# Patient Record
Sex: Female | Born: 1985 | Race: Black or African American | Hispanic: No | Marital: Single | Smoking: Never smoker
Health system: Southern US, Community
[De-identification: ages and names within clinical notes are randomized; demographics above are authoritative.]

## PROBLEM LIST (undated history)

## (undated) ENCOUNTER — Inpatient Hospital Stay (HOSPITAL_COMMUNITY): Payer: Self-pay

## (undated) DIAGNOSIS — D649 Anemia, unspecified: Secondary | ICD-10-CM

## (undated) DIAGNOSIS — B009 Herpesviral infection, unspecified: Secondary | ICD-10-CM

## (undated) DIAGNOSIS — F909 Attention-deficit hyperactivity disorder, unspecified type: Secondary | ICD-10-CM

## (undated) HISTORY — DX: Attention-deficit hyperactivity disorder, unspecified type: F90.9

---

## 2009-06-15 ENCOUNTER — Emergency Department (HOSPITAL_COMMUNITY): Admission: EM | Admit: 2009-06-15 | Discharge: 2009-06-15 | Payer: Self-pay | Admitting: Emergency Medicine

## 2009-06-16 ENCOUNTER — Emergency Department (HOSPITAL_COMMUNITY): Admission: EM | Admit: 2009-06-16 | Discharge: 2009-06-17 | Payer: Self-pay | Admitting: Emergency Medicine

## 2009-10-20 ENCOUNTER — Emergency Department (HOSPITAL_COMMUNITY): Admission: EM | Admit: 2009-10-20 | Discharge: 2009-10-20 | Payer: Self-pay | Admitting: Emergency Medicine

## 2009-11-04 ENCOUNTER — Emergency Department (HOSPITAL_COMMUNITY): Admission: EM | Admit: 2009-11-04 | Discharge: 2009-11-04 | Payer: Self-pay | Admitting: Emergency Medicine

## 2010-07-18 LAB — POCT I-STAT, CHEM 8
BUN: 4 mg/dL — ABNORMAL LOW (ref 6–23)
Creatinine, Ser: 0.7 mg/dL (ref 0.4–1.2)
Hemoglobin: 13.3 g/dL (ref 12.0–15.0)
Potassium: 4 mEq/L (ref 3.5–5.1)
Sodium: 141 mEq/L (ref 135–145)
TCO2: 25 mmol/L (ref 0–100)

## 2010-07-18 LAB — D-DIMER, QUANTITATIVE: D-Dimer, Quant: 0.93 ug/mL-FEU — ABNORMAL HIGH (ref 0.00–0.48)

## 2010-07-22 LAB — URINALYSIS, ROUTINE W REFLEX MICROSCOPIC
Bilirubin Urine: NEGATIVE
Protein, ur: NEGATIVE mg/dL
Specific Gravity, Urine: 1.02 (ref 1.005–1.030)
Urobilinogen, UA: 0.2 mg/dL (ref 0.0–1.0)
pH: 5.5 (ref 5.0–8.0)

## 2010-07-22 LAB — URINE MICROSCOPIC-ADD ON

## 2011-08-15 ENCOUNTER — Ambulatory Visit (INDEPENDENT_AMBULATORY_CARE_PROVIDER_SITE_OTHER): Payer: BC Managed Care – PPO | Admitting: Family Medicine

## 2011-08-15 VITALS — BP 126/80 | HR 68 | Temp 98.6°F | Resp 18 | Ht 63.5 in | Wt 124.0 lb

## 2011-08-15 DIAGNOSIS — Z3002 Counseling and instruction in natural family planning to avoid pregnancy: Secondary | ICD-10-CM

## 2011-08-15 DIAGNOSIS — N920 Excessive and frequent menstruation with regular cycle: Secondary | ICD-10-CM

## 2011-08-15 LAB — POCT CBC
Lymph, poc: 2.4 (ref 0.6–3.4)
MCH, POC: 28.9 pg (ref 27–31.2)
MCHC: 32.7 g/dL (ref 31.8–35.4)
MID (cbc): 0.5 (ref 0–0.9)
MPV: 8.2 fL (ref 0–99.8)
POC LYMPH PERCENT: 47.8 %L (ref 10–50)
POC MID %: 10.3 %M (ref 0–12)
Platelet Count, POC: 214 10*3/uL (ref 142–424)
RDW, POC: 12.6 %

## 2011-08-15 LAB — POCT WET PREP WITH KOH

## 2011-08-15 LAB — POCT URINE PREGNANCY: Preg Test, Ur: NEGATIVE

## 2011-08-15 MED ORDER — MEDROXYPROGESTERONE ACETATE 150 MG/ML IM SUSP
150.0000 mg | Freq: Once | INTRAMUSCULAR | Status: AC
  Administered 2011-08-15: 150 mg via INTRAMUSCULAR

## 2011-08-15 NOTE — Progress Notes (Signed)
Patient Name: Kelly Rush Date of Birth: [birth year] Medical Record Number: [id number] Gender: female Date of Encounter: 08/15/2011  History of Present Illness:  Kelly Rush is a 26 y.o. very pleasant female patient who presents with the following:  Here today with prolonged menstrual cycle- has been bleeding for 3.5 weeks.  Started Depo- provera in January- it was her first shot and she got it in her hometown- not in [place].      She had her shot 05/22/11.  She did not have any bleeding until her current bleeding episode started about 3 weeks ago.  Has never used Depo before.    She has a history of hypercoagulability and is not to use estrogen.  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  She does not smoke  She also has complaint of pelvic pains/ cramps off and on for the last 3 weeks.  She is currently not sexually active- she has been abstinent since about November of last year.   She sometimes notes some "mucus" on her panty liner but no other discharge.   No dysuria.  No nausea, vomiting or fever.   Negative pap/ genprobe 01/2011  Has one son, 55 years old.    There is no problem list on file for this patient.  No past medical history on file. No past surgical history on file. History  Substance Use Topics  . Smoking status: Never Smoker   . Smokeless tobacco: Not on file  . Alcohol Use: Not on file   No family history on file. No Known Allergies  Medication list has been reviewed and updated.  Review of Systems: As per HPI- otherwise negative. Feels well overall  Physical Examination: Filed Vitals:   08/15/11 1402  BP: 126/80  Pulse: 68  Temp: 98.6 F (37 C)  TempSrc: Oral  Resp: 18  Height: 5' 3.5" (1.613 m)  Weight: 124 lb (56.246 kg)    Body mass index is 21.62 kg/(m^2).  GEN: WDWN, NAD, Non-toxic, A & O x 3 HEENT: Atraumatic, Normocephalic. Neck supple. No masses,  No LAD.  TM, oropharynx wnl Ears and Nose: No external deformity. CV: RRR, No M/G/R. No JVD. No thrill. No extra heart sounds. PULM: CTA B, no wheezes, crackles, rhonchi. No retractions. No resp. distress. No accessory muscle use. ABD: S, NT, ND, +BS. No rebound. No HSM. GU: normal externally. Blood and some clots in vaginal vault.  Minimal tenderness in uterus.  No adnexal pain or fullness, no CMT.  No cervical discharge EXTR: No c/c/e NEURO Normal gait.  PSYCH: Normally interactive. Conversant. Not depressed or anxious appearing.  Calm demeanor.    Results for orders placed in visit on 08/15/11  POCT URINE PREGNANCY      Component Value Range   Preg Test, Ur Negative    POCT WET PREP WITH KOH      Component Value Range   Trichomonas, UA Negative     Clue Cells Wet Prep HPF POC 0-1     Epithelial Wet Prep HPF POC 0-5     Yeast Wet Prep HPF POC negative     Bacteria Wet Prep HPF POC 3+     RBC Wet Prep HPF POC 9-22     WBC Wet Prep HPF POC 1-3     KOH Prep POC Negative    POCT CBC      Component Value Range   WBC 5.0  4.6 - 10.2 (K/uL)   Lymph, poc 2.4  0.6 - 3.4    POC LYMPH PERCENT 47.8  10 - 50 (%L)   MID (cbc) 0.5  0 - 0.9    POC MID % 10.3  0 - 12 (%M)   POC Granulocyte 2.1  2 - 6.9    Granulocyte percent 41.9  37 - 80 (%G)   RBC 4.36  4.04 - 5.48 (M/uL)   Hemoglobin 12.6  12.2 - 16.2 (g/dL)   HCT, POC 16.1  09.6 - 47.9 (%)   MCV 88.4  80 - 97 (fL)   MCH, POC 28.9  27 - 31.2 (pg)   MCHC 32.7  31.8 - 35.4 (g/dL)   RDW, POC 04.5     Platelet Count, POC 214  142 - 424 (K/uL)   MPV 8.2  0 - 99.8 (fL)    Assessment and Plan: 1. Menorrhagia  POCT urine pregnancy, GC/chlamydia probe amp, genital, POCT Wet Prep with KOH, POCT CBC   Menorrhagia, due for Depo shot.  Giving shot may help to resolve her bleeding so will give shot today.  She brought her own Depo- provera here today so we will give her injection using her supply. If her bleeding is not better in 4-5 days  please call me and I will refer her to OBG- Sooner if worse.

## 2011-08-16 LAB — GC/CHLAMYDIA PROBE AMP, GENITAL
Chlamydia, DNA Probe: NEGATIVE
GC Probe Amp, Genital: NEGATIVE

## 2011-08-31 ENCOUNTER — Ambulatory Visit (INDEPENDENT_AMBULATORY_CARE_PROVIDER_SITE_OTHER): Payer: BC Managed Care – PPO | Admitting: Family Medicine

## 2011-08-31 ENCOUNTER — Telehealth: Payer: Self-pay

## 2011-08-31 VITALS — BP 115/83 | HR 71 | Temp 98.2°F | Resp 16 | Ht 63.5 in | Wt 125.0 lb

## 2011-08-31 DIAGNOSIS — N920 Excessive and frequent menstruation with regular cycle: Secondary | ICD-10-CM

## 2011-08-31 LAB — POCT CBC
Lymph, poc: 2.2 (ref 0.6–3.4)
MCH, POC: 29.6 pg (ref 27–31.2)
MCHC: 33.3 g/dL (ref 31.8–35.4)
MCV: 88.7 fL (ref 80–97)
MID (cbc): 0.4 (ref 0–0.9)
MPV: 8 fL (ref 0–99.8)
POC LYMPH PERCENT: 47.6 %L (ref 10–50)
RDW, POC: 12.2 %
WBC: 4.7 10*3/uL (ref 4.6–10.2)

## 2011-08-31 LAB — POCT URINE PREGNANCY: Preg Test, Ur: NEGATIVE

## 2011-08-31 LAB — POCT WET PREP WITH KOH
Clue Cells Wet Prep HPF POC: NEGATIVE
Trichomonas, UA: NEGATIVE

## 2011-08-31 MED ORDER — MEGESTROL ACETATE 40 MG PO TABS: ORAL_TABLET | ORAL | Status: DC

## 2011-08-31 NOTE — Telephone Encounter (Signed)
Dr. Patsy Lager- patient states that you had discussed a referral and wants to know if she has to come back in to see you for that or if you can just get her that appointment now. Please call her to advise

## 2011-08-31 NOTE — Telephone Encounter (Signed)
Pt came in to be seen after phone msg was taken.

## 2011-08-31 NOTE — Progress Notes (Signed)
Patient Name: Kelly Rush Date of Birth: 1985-07-12 Medical Record Number: [id number] Gender: female Date of Encounter: 08/31/2011  History of Present Illness:  Kelly Rush is a 26 y.o. very pleasant female patient who presents with the following:  Was here last month to discuss menorrhagia that occurred after her first depo- shot.  We gave her a second Depo- provera shot at her visit.  Her bleeding resolved a few days later and she had no bleeding for about 3 days. However, since then she has had bleeding- heavy at times with clots- off and on.   Also, for the last 7 to 10 days she has felt tired, noted headaches and some dizziness.    She got her first Depo shot in January.   Not currently sexually active- none since the end of 2012.   No vaginal discharge. - GC/ chlamydia at last visit  There is no problem list on file for this patient.  No past medical history on file. No past surgical history on file. History  Substance Use Topics  . Smoking status: Never Smoker   . Smokeless tobacco: Not on file  . Alcohol Use: Not on file   No family history on file. No Known Allergies  Medication list has been reviewed and updated.  Review of Systems: As per HPI- otherwise negative. She notes no urinary or vaginal symptoms  Physical Examination: Filed Vitals:   08/31/11 1559  BP: 115/83  Pulse: 71  Temp: 98.2 F (36.8 C)  TempSrc: Oral  Resp: 16  Height: 5' 3.5" (1.613 m)  Weight: 125 lb (56.7 kg)    Body mass index is 21.80 kg/(m^2).  GEN: WDWN, NAD, Non-toxic, A & O x 3- looks well HEENT: Atraumatic, Normocephalic. Neck supple. No masses, No LAD. Ears and Nose: No external deformity. CV: RRR, No M/G/R. No JVD. No thrill. No extra heart sounds. PULM: CTA B, no wheezes, crackles, rhonchi. No retractions. No resp. distress. No accessory muscle use. ABD: S,  ND, +BS. No rebound. No HSM. There is mild suprapubic and left adnexal tenderness EXTR: No c/c/e NEURO  Normal gait.  PSYCH: Normally interactive. Conversant. Not depressed or anxious appearing.  Calm demeanor.  GU: normal external genitalia, normal vaginal canal with bleeding.  No CMT.  A little tender to uterine and left adnexal palpation, but not very much so  Results for orders placed in visit on 08/31/11  POCT CBC      Component Value Range   WBC 4.7  4.6 - 10.2 (K/uL)   Lymph, poc 2.2  0.6 - 3.4    POC LYMPH PERCENT 47.6  10 - 50 (%L)   MID (cbc) 0.4  0 - 0.9    POC MID % 8.6  0 - 12 (%M)   POC Granulocyte 2.1  2 - 6.9    Granulocyte percent 43.8  37 - 80 (%G)   RBC 4.46  4.04 - 5.48 (M/uL)   Hemoglobin 13.2  12.2 - 16.2 (g/dL)   HCT, POC 78.2  95.6 - 47.9 (%)   MCV 88.7  80 - 97 (fL)   MCH, POC 29.6  27 - 31.2 (pg)   MCHC 33.3  31.8 - 35.4 (g/dL)   RDW, POC 21.3     Platelet Count, POC 248  142 - 424 (K/uL)   MPV 8.0  0 - 99.8 (fL)  POCT WET PREP WITH KOH      Component Value Range   Trichomonas, UA Negative  Clue Cells Wet Prep HPF POC neg     Epithelial Wet Prep HPF POC 0-1     Yeast Wet Prep HPF POC neg     Bacteria Wet Prep HPF POC neg     RBC Wet Prep HPF POC tntc     WBC Wet Prep HPF POC 0-1     KOH Prep POC Negative    POCT URINE PREGNANCY      Component Value Range   Preg Test, Ur Negative      Assessment and Plan: 1. Menorrhagia  POCT CBC, POCT Wet Prep with KOH, POCT urine pregnancy, megestrol (MEGACE) 40 MG tablet   Menorrhagia despite depo- provera.  Due to a clotting disorder Kelly Rush is not to have estrogen.  Will start on Megace 40mg - TID for 5 days, then BID for 5 days, then QD to help stabilize her endometrium and stop her bleeding.  Will also refer to see OBG as soon as possible- suspect that her pain is likely due to uterine cramping and perhaps an ovarian cyst.  She will seek care right away if she gets worse.   Scheduled an app[facility] at [facility] am.  Kelly Rush would like to be seen sooner than this if possible, so  I will look around to see if any other offices have anything in the next couple of days.

## 2011-09-01 ENCOUNTER — Other Ambulatory Visit: Payer: Self-pay | Admitting: Family Medicine

## 2011-09-01 DIAGNOSIS — N920 Excessive and frequent menstruation with regular cycle: Secondary | ICD-10-CM

## 2011-09-13 ENCOUNTER — Ambulatory Visit (INDEPENDENT_AMBULATORY_CARE_PROVIDER_SITE_OTHER): Payer: BC Managed Care – PPO | Admitting: Family Medicine

## 2011-09-13 VITALS — BP 121/83 | HR 80 | Temp 99.0°F | Resp 20 | Ht 63.5 in | Wt 129.0 lb

## 2011-09-13 DIAGNOSIS — Z111 Encounter for screening for respiratory tuberculosis: Secondary | ICD-10-CM

## 2011-09-13 NOTE — Progress Notes (Signed)
  Patient Name: Kelly Rush Date of Birth: 09-06-1985 Medical Record Number: [id number] Gender: female Date of Encounter: 09/13/2011  History of Present Illness:  Kelly Rush is a 26 y.o. very pleasant female patient who presents with the following:  She is here today to have work forms filled out- needs a PPD.  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   She is generally healthy- no major health issues except for menorrhagia for which she is seeing OB- GYN- Dr. Billy Coast. He started her on COC at her visit and she is doing well so far.    She has no problems with lifting, no back problems.  She has a 6 year old child so she is familiar with what her duties will be in a childcare setting.   There is no problem list on file for this patient.  No past medical history on file. No past surgical history on file. History  Substance Use Topics  . Smoking status: Never Smoker   . Smokeless tobacco: Not on file  . Alcohol Use: Not on file   No family history on file. No Known Allergies  Medication list has been reviewed and updated.  Review of Systems: As per HPI- otherwise negative.   Physical Examination: Filed Vitals:   09/13/11 1511  BP: 121/83  Pulse: 80  Temp: 99 F (37.2 C)  TempSrc: Oral  Resp: 20  Height: 5' 3.5" (1.613 m)  Weight: 129 lb (58.514 kg)    Body mass index is 22.49 kg/(m^2).  GEN: WDWN, NAD, Non-toxic, A & O x 3 HEENT: Atraumatic, Normocephalic. Neck supple. No masses, No LAD. Ears and Nose: No external deformity. CV: RRR, No M/G/R. No JVD. No thrill. No extra heart sounds. PULM: CTA B, no wheezes, crackles, rhonchi. No retractions. No resp. distress. No accessory muscle use. ABD: S, NT, ND, +BS. No rebound. No HSM. EXTR: No c/c/e NEURO Normal gait.  PSYCH: Normally interactive. Conversant. Not depressed or anxious appearing.  Calm demeanor.    Assessment and Plan: 1. Screening-pulmonary TB  TB Skin Test   Place PPD.  Otherwise cleared for job.

## 2011-09-16 ENCOUNTER — Encounter (INDEPENDENT_AMBULATORY_CARE_PROVIDER_SITE_OTHER): Payer: BC Managed Care – PPO

## 2011-09-16 DIAGNOSIS — Z111 Encounter for screening for respiratory tuberculosis: Secondary | ICD-10-CM

## 2011-09-16 LAB — TB SKIN TEST: TB Skin Test: NEGATIVE mm

## 2011-09-29 ENCOUNTER — Ambulatory Visit (INDEPENDENT_AMBULATORY_CARE_PROVIDER_SITE_OTHER): Payer: BC Managed Care – PPO | Admitting: Family Medicine

## 2011-09-29 VITALS — BP 119/77 | HR 101 | Temp 98.9°F | Resp 16 | Ht 63.5 in | Wt 126.0 lb

## 2011-09-29 DIAGNOSIS — H6690 Otitis media, unspecified, unspecified ear: Secondary | ICD-10-CM

## 2011-09-29 DIAGNOSIS — R05 Cough: Secondary | ICD-10-CM

## 2011-09-29 DIAGNOSIS — R059 Cough, unspecified: Secondary | ICD-10-CM

## 2011-09-29 MED ORDER — HYDROCODONE-HOMATROPINE 5-1.5 MG/5ML PO SYRP: 5.0000 mL | ORAL_SOLUTION | Freq: Three times a day (TID) | ORAL | Status: AC | PRN

## 2011-09-29 MED ORDER — AMOXICILLIN 500 MG PO CAPS: 1000.0000 mg | ORAL_CAPSULE | Freq: Two times a day (BID) | ORAL | Status: AC

## 2011-09-29 NOTE — Progress Notes (Signed)
  Patient Name: Kelly Rush Date of Birth: Jan 15, 1986 Medical Record Number: [id number] Gender: female Date of Encounter: 09/29/2011  History of Present Illness:  Kelly Rush is a 26 y.o. very pleasant female patient who presents with the following:  She has been ill for about 2 weeks- started with allergy type symptoms (sneezing, runny eyes), then over the last 2 days she felt worse and had a cough, CP.  She coughed up some mucus this morning.  She notes a ST, sneezing, runny nose.  No earache.  No nausea, vomiting or diarrhea.  No fever that she has noted.  She has been taking mucinex and some other OTC cold preps.  No medication this morning.  She has had some recent issues with menorrhagia, but is doing better with an OCP.  She also had a shot of depo- provera in April  There is no problem list on file for this patient.  No past medical history on file. No past surgical history on file. History  Substance Use Topics  . Smoking status: Never Smoker   . Smokeless tobacco: Not on file  . Alcohol Use: Not on file   No family history on file. No Known Allergies  Medication list has been reviewed and updated.  Prior to Admission medications   Medication Sig Start Date End Date Taking? Authorizing Provider  medroxyPROGESTERone (DEPO-PROVERA) 150 MG/ML injection Inject 150 mg into the muscle every 3 (three) months.   Yes Historical Provider, MD  aspirin-acetaminophen-caffeine (EXCEDRIN MIGRAINE) [id number] MG per tablet Take 1 tablet by mouth as needed.    Historical Provider, MD  megestrol (MEGACE) 40 MG tablet Take one pill three times daily for 5 days, then one pills twice daily for 5 days, then once daily 08/31/11   Pearline Cables, MD  Norethin-Eth Estradiol-Fe [facility] FE,WYMZYA Cecille Amsterdam) 0.4-35 MG-MCG tablet Chew 1 tablet by mouth daily.    Historical Provider, MD    Review of Systems: As per HPI- otherwise negative.   Physical Examination: Filed Vitals:   09/29/11 1235  BP: 119/77  Pulse: 101  Temp: 98.9 F (37.2 C)  Resp: 16   Filed Vitals:   09/29/11 1235  Height: 5' 3.5" (1.613 m)  Weight: 126 lb (57.153 kg)   Body mass index is 21.97 kg/(m^2).  GEN: WDWN, NAD, Non-toxic, A & O x 3 HEENT: Atraumatic, Normocephalic. Neck supple. No masses, No LAD.  Right TM is injected and bulging, left WNL, oropharynx wnl Ears and Nose: No external deformity. CV: RRR, No M/G/R. No JVD. No thrill. No extra heart sounds. PULM: CTA B, no wheezes, crackles, rhonchi. No retractions. No resp. distress. No accessory muscle use. ABD: S, NT, ND, +BS. No rebound. No HSM. EXTR: No c/c/e NEURO Normal gait.  PSYCH: Normally interactive. Conversant. Not depressed or anxious appearing.  Calm demeanor.    Assessment and Plan: 1. Otitis media  amoxicillin (AMOXIL) 500 MG capsule  2. Cough  HYDROcodone-homatropine (HYCODAN) 5-1.5 MG/5ML syrup   Treat as above.  Rest, fluids, otc medications for aches as needed. Patient (or parent if minor) instructed to return to clinic or call if not better in 2-3 day(s).   Abbe Amsterdam, MD 09/29/2011 12:53 PM

## 2011-11-14 ENCOUNTER — Encounter (HOSPITAL_COMMUNITY): Payer: Self-pay | Admitting: *Deleted

## 2011-11-14 ENCOUNTER — Emergency Department (HOSPITAL_COMMUNITY)
Admission: EM | Admit: 2011-11-14 | Discharge: 2011-11-15 | Disposition: A | Discharge status: Home / Self Care | Payer: BC Managed Care – PPO | Attending: Emergency Medicine | Admitting: Emergency Medicine

## 2011-11-14 ENCOUNTER — Ambulatory Visit: Payer: BC Managed Care – PPO

## 2011-11-14 DIAGNOSIS — R102 Pelvic and perineal pain: Secondary | ICD-10-CM

## 2011-11-14 DIAGNOSIS — R599 Enlarged lymph nodes, unspecified: Secondary | ICD-10-CM | POA: Insufficient documentation

## 2011-11-14 DIAGNOSIS — R110 Nausea: Secondary | ICD-10-CM | POA: Insufficient documentation

## 2011-11-14 DIAGNOSIS — R10814 Left lower quadrant abdominal tenderness: Secondary | ICD-10-CM | POA: Insufficient documentation

## 2011-11-14 DIAGNOSIS — N898 Other specified noninflammatory disorders of vagina: Secondary | ICD-10-CM | POA: Insufficient documentation

## 2011-11-14 DIAGNOSIS — N949 Unspecified condition associated with female genital organs and menstrual cycle: Secondary | ICD-10-CM | POA: Insufficient documentation

## 2011-11-14 LAB — WET PREP, GENITAL
Trich, Wet Prep: NONE SEEN
Yeast Wet Prep HPF POC: NONE SEEN

## 2011-11-14 LAB — URINALYSIS, ROUTINE W REFLEX MICROSCOPIC
Glucose, UA: NEGATIVE mg/dL
Ketones, ur: NEGATIVE mg/dL
Nitrite: NEGATIVE
Specific Gravity, Urine: 1.03 (ref 1.005–1.030)

## 2011-11-14 LAB — URINE MICROSCOPIC-ADD ON

## 2011-11-14 LAB — POCT PREGNANCY, URINE: Preg Test, Ur: NEGATIVE

## 2011-11-14 MED ORDER — CEFTRIAXONE SODIUM 250 MG IJ SOLR
250.0000 mg | Freq: Once | INTRAMUSCULAR | Status: AC
  Administered 2011-11-15: 250 mg via INTRAMUSCULAR
  Filled 2011-11-14: qty 250

## 2011-11-14 MED ORDER — DOXYCYCLINE HYCLATE 100 MG PO CAPS: 100.0000 mg | ORAL_CAPSULE | Freq: Two times a day (BID) | ORAL | Status: AC

## 2011-11-14 MED ORDER — HYDROCODONE-ACETAMINOPHEN 5-325 MG PO TABS: 1.0000 | ORAL_TABLET | ORAL | Status: AC | PRN

## 2011-11-14 MED ORDER — OXYCODONE-ACETAMINOPHEN 5-325 MG PO TABS
1.0000 | ORAL_TABLET | Freq: Once | ORAL | Status: AC
  Administered 2011-11-14: 1 via ORAL
  Filled 2011-11-14: qty 1

## 2011-11-14 NOTE — ED Provider Notes (Signed)
History     CSN: [id number]  Arrival date & time 11/14/11  Barry Brunner   First MD Initiated Contact with Patient 11/14/11 2200      Chief Complaint  Patient presents with  . Pelvic Pain    (Consider location/radiation/quality/duration/timing/severity/associated sxs/prior treatment) Patient is a 26 y.o. female presenting with pelvic pain. The history is provided by the patient and medical records.  Pelvic Pain This is a recurrent problem. The current episode started today. The problem occurs 2 to 4 times per day. The problem has been unchanged. Associated symptoms include nausea. Pertinent negatives include no abdominal pain, change in bowel habit, chest pain, chills, coughing, fever, headaches, rash, vomiting or weakness. Nothing aggravates the symptoms. Treatments tried: birth control. The treatment provided mild relief.    History reviewed. No pertinent past medical history.  History reviewed. No pertinent past surgical history.  No family history on file.  History  Substance Use Topics  . Smoking status: Never Smoker   . Smokeless tobacco: Not on file  . Alcohol Use: No    OB History    Grav Para Term Preterm Abortions TAB SAB Ect Mult Living                  Review of Systems  Constitutional: Negative for fever and chills.  Respiratory: Negative for cough and shortness of breath.   Cardiovascular: Negative for chest pain and palpitations.  Gastrointestinal: Positive for nausea. Negative for vomiting, abdominal pain, diarrhea, constipation, abdominal distention and change in bowel habit.  Genitourinary: Positive for urgency and pelvic pain. Negative for dysuria, frequency, hematuria, flank pain, decreased urine volume, vaginal bleeding, vaginal discharge and genital sores.  Musculoskeletal: Negative for back pain.  Skin: Negative for color change and rash.  Neurological: Negative for weakness, light-headedness and headaches.  Hematological: Positive for adenopathy (L  groin).  All other systems reviewed and are negative.    Allergies  Review of patient's allergies indicates no known allergies.  Home Medications   Current Outpatient Rx  Name Route Sig Dispense Refill  . ASPIRIN-ACETAMINOPHEN-CAFFEINE 250-250-65 MG PO TABS Oral Take 1 tablet by mouth as needed. For migraine    . NORETHIN-ETH ESTRADIOL-FE 0.4-35 MG-MCG PO CHEW Oral Chew 1 tablet by mouth daily.      BP 126/77  Pulse 89  Temp 98.6 F (37 C) (Oral)  Resp 16  SpO2 100%  LMP 10/07/2011  Physical Exam  Nursing note and vitals reviewed. Constitutional: She is oriented to person, place, and time. She appears well-developed and well-nourished.  HENT:  Head: Normocephalic and atraumatic.  Eyes: Pupils are equal, round, and reactive to light.  Cardiovascular: Normal rate, regular rhythm, normal heart sounds and intact distal pulses.   Pulmonary/Chest: Effort normal and breath sounds normal. No respiratory distress.  Abdominal: Soft. Bowel sounds are normal. She exhibits no distension. There is tenderness in the left lower quadrant.    Genitourinary: Uterus is not tender. Cervix exhibits no motion tenderness and no friability. Right adnexum displays no mass, no tenderness and no fullness. Left adnexum displays no mass, no tenderness and no fullness. No bleeding around the vagina. Vaginal discharge (thick, clear, mucousy) found.       Discomfort with pelvic exam  Lymphadenopathy:       Left: Inguinal (1 cm, mobile) adenopathy present.  Neurological: She is alert and oriented to person, place, and time.  Skin: Skin is warm and dry.  Psychiatric: She has a normal mood and affect.    ED  Course  Procedures (including critical care time)  Labs Reviewed  URINALYSIS, ROUTINE W REFLEX MICROSCOPIC - Abnormal; Notable for the following:    Bilirubin Urine SMALL (*)     Leukocytes, UA SMALL (*)     All other components within normal limits  WET PREP, GENITAL - Abnormal; Notable for the  following:    Clue Cells Wet Prep HPF POC FEW (*)     WBC, Wet Prep HPF POC MODERATE (*)     All other components within normal limits  POCT PREGNANCY, URINE  URINE MICROSCOPIC-ADD ON  GC/CHLAMYDIA PROBE AMP, GENITAL   No results found.   1. Pelvic pain in female       MDM  This is a 27 year old female who presents with left-sided pelvic pain since this morning. She states that she has had similar pain multiple times over the past several months, and has been seen by her regular doctor as well as an OB for this, and they have not found why she has the pain. She states she was started on birth control do to abnormal uterine bleeding, which initially provided some mild relief of her symptoms, but she has continued to have pain.  She says she has a constant ache in that area with intermittent sharp pains radiating to her back that last for several seconds to several minutes before lessening. This morning she noticed a lymph node in her left inguinal region. She has some mild nausea, but no vomiting. She denies any current vaginal discharge or bleeding, does have some urinary frequency, but denies any frequency, dysuria or hematuria. She has not previously had any STDs, and has not had any sexual partners since last December. She denies any other complaints. On exam she does have some tenderness during pelvic exam, but no cervical motion tenderness no adnexal or uterine tenderness. She had a small amount of thick clear mucusy discharge.   Also remarkable for moderate amount of white blood cells, there are small amount of leukocytes on UA without white blood cells. Due to the white cells, will treat the patient with Rocephin and two-week course of doxycycline for unknown pelvic infection. Discussed with the patient treatment at home, followup with her regular doctor and her OD, and indications for return. The patient expresses understanding of this plan.    Theotis Burrow, MD 11/15/11  [phone]

## 2011-11-14 NOTE — ED Notes (Signed)
Patient with sharp pelvic pain that started this am (intermittently).  Denies any burning or pain when she urinates or vaginal discharge.  Denies any intercourse recently.

## 2011-11-14 NOTE — ED Notes (Signed)
No life-threatening labs noted.

## 2011-11-15 NOTE — ED Notes (Signed)
Pt discharged home in good condition with friend.

## 2011-11-16 LAB — GC/CHLAMYDIA PROBE AMP, GENITAL
Chlamydia, DNA Probe: NEGATIVE
GC Probe Amp, Genital: NEGATIVE

## 2011-11-16 NOTE — ED Provider Notes (Signed)
I saw and evaluated the patient, reviewed the resident's note and I agree with the findings and plan. Patient with worsening pelvic pain. She has diffuse abdominal tenderness in the suprapubic region and tenderness with bimanual exam. Patient does have numerous white blood cells on wet prep and GC and Chlamydia were done. Because patient has not been on any antibiotics we'll treat with Doxy to cover for PID.  Gwyneth Sprout, MD 11/16/11 2224

## 2012-06-10 ENCOUNTER — Encounter (HOSPITAL_COMMUNITY): Payer: Self-pay | Admitting: *Deleted

## 2012-06-10 ENCOUNTER — Emergency Department (HOSPITAL_COMMUNITY)
Admission: EM | Admit: 2012-06-10 | Discharge: 2012-06-10 | Disposition: A | Discharge status: Home / Self Care | Payer: BC Managed Care – PPO | Attending: Emergency Medicine | Admitting: Emergency Medicine

## 2012-06-10 DIAGNOSIS — Z3202 Encounter for pregnancy test, result negative: Secondary | ICD-10-CM | POA: Insufficient documentation

## 2012-06-10 DIAGNOSIS — R109 Unspecified abdominal pain: Secondary | ICD-10-CM | POA: Insufficient documentation

## 2012-06-10 DIAGNOSIS — R112 Nausea with vomiting, unspecified: Secondary | ICD-10-CM

## 2012-06-10 DIAGNOSIS — R6883 Chills (without fever): Secondary | ICD-10-CM | POA: Insufficient documentation

## 2012-06-10 DIAGNOSIS — R197 Diarrhea, unspecified: Secondary | ICD-10-CM | POA: Insufficient documentation

## 2012-06-10 DIAGNOSIS — Z7982 Long term (current) use of aspirin: Secondary | ICD-10-CM | POA: Insufficient documentation

## 2012-06-10 LAB — URINALYSIS, ROUTINE W REFLEX MICROSCOPIC
Bilirubin Urine: NEGATIVE
Glucose, UA: NEGATIVE mg/dL
Ketones, ur: >80 mg/dL — AB
Nitrite: NEGATIVE
Protein, ur: NEGATIVE mg/dL
Specific Gravity, Urine: 1.034 — ABNORMAL HIGH (ref 1.005–1.030)
Urobilinogen, UA: 1 mg/dL (ref 0.0–1.0)
pH: 7 (ref 5.0–8.0)

## 2012-06-10 LAB — URINE MICROSCOPIC-ADD ON

## 2012-06-10 MED ORDER — SODIUM CHLORIDE 0.9 % IV BOLUS (SEPSIS)
2000.0000 mL | Freq: Once | INTRAVENOUS | Status: AC
  Administered 2012-06-10: 2000 mL via INTRAVENOUS

## 2012-06-10 MED ORDER — ONDANSETRON 8 MG PO TBDP: 8.0000 mg | ORAL_TABLET | Freq: Three times a day (TID) | ORAL | Status: DC | PRN

## 2012-06-10 MED ORDER — FAMOTIDINE 20 MG PO TABS
40.0000 mg | ORAL_TABLET | Freq: Once | ORAL | Status: AC
  Administered 2012-06-10: 40 mg via ORAL
  Filled 2012-06-10: qty 2

## 2012-06-10 MED ORDER — ONDANSETRON 4 MG PO TBDP
8.0000 mg | ORAL_TABLET | Freq: Once | ORAL | Status: AC
  Administered 2012-06-10: 8 mg via ORAL
  Filled 2012-06-10: qty 2

## 2012-06-10 NOTE — ED Notes (Signed)
Urine sent fluids given

## 2012-06-10 NOTE — ED Provider Notes (Signed)
History     CSN: [id number]  Arrival date & time 06/10/12  0421   First MD Initiated Contact with Patient 06/10/12 [phone]      Chief Complaint  Patient presents with  . Emesis    (Consider location/radiation/quality/duration/timing/severity/associated sxs/prior treatment) HPI Comments: Patient with no significant past medical history, no history of abdominal surgeries presents with onset of upper abdominal pain, chills, vomiting since 1:30 this morning. Patient is vomiting multiple times. Vomiting is nonbloody, nonbilious. Patient has had an episode of soft stool while in emergency department. Stool is nonbloody. No treatments prior to arrival. Pain in the upper abdomen is described as sharp. It does not radiate. Patient denies fevers, recent symptoms of illness. Patient states that her last menstrual period was a week ago and was longer and lighter than normal. Onset acute. Course is constant. Nothing makes symptoms better or worse.  Patient is a 27 y.o. female presenting with vomiting. The history is provided by the patient.  Emesis Associated symptoms: abdominal pain and chills   Associated symptoms: no diarrhea, no headaches, no myalgias and no sore throat     History reviewed. No pertinent past medical history.  History reviewed. No pertinent past surgical history.  No family history on file.  History  Substance Use Topics  . Smoking status: Never Smoker   . Smokeless tobacco: Not on file  . Alcohol Use: No    OB History   Grav Para Term Preterm Abortions TAB SAB Ect Mult Living                  Review of Systems  Constitutional: Positive for chills. Negative for fever.  HENT: Negative for sore throat and rhinorrhea.   Eyes: Negative for redness.  Respiratory: Negative for cough.   Cardiovascular: Negative for chest pain.  Gastrointestinal: Positive for nausea, vomiting and abdominal pain. Negative for diarrhea.  Genitourinary: Negative for dysuria.   Musculoskeletal: Negative for myalgias.  Skin: Negative for rash.  Neurological: Negative for headaches.    Allergies  Review of patient's allergies indicates no known allergies.  Home Medications   Current Outpatient Rx  Name  Route  Sig  Dispense  Refill  . aspirin-acetaminophen-caffeine (EXCEDRIN MIGRAINE) 250-250-65 MG per tablet   Oral   Take 2 tablets by mouth every 6 (six) hours as needed. For migraine         . ondansetron (ZOFRAN ODT) 8 MG disintegrating tablet   Oral   Take 1 tablet (8 mg total) by mouth every 8 (eight) hours as needed for nausea.   6 tablet   0     BP 128/96  Pulse 100  Temp(Src) 97.7 F (36.5 C) (Oral)  Resp 16  SpO2 100%  LMP 06/02/2012  Physical Exam  Nursing note and vitals reviewed. Constitutional: She appears well-developed and well-nourished.  HENT:  Head: Normocephalic and atraumatic.  Eyes: Conjunctivae are normal. Right eye exhibits no discharge. Left eye exhibits no discharge.  Neck: Normal range of motion. Neck supple.  Cardiovascular: Normal rate, regular rhythm and normal heart sounds.   Pulmonary/Chest: Effort normal and breath sounds normal.  Abdominal: Soft. Bowel sounds are normal. She exhibits no distension. There is tenderness. There is no rebound and no guarding.  Neurological: She is alert.  Skin: Skin is warm and dry.  Psychiatric: She has a normal mood and affect.    ED Course  Procedures (including critical care time)  Labs Reviewed  URINALYSIS, ROUTINE W REFLEX MICROSCOPIC - Abnormal; Notable  for the following:    APPearance CLOUDY (*)    Specific Gravity, Urine 1.034 (*)    Hgb urine dipstick MODERATE (*)    Ketones, ur >80 (*)    Leukocytes, UA SMALL (*)    All other components within normal limits  URINE MICROSCOPIC-ADD ON - Abnormal; Notable for the following:    Squamous Epithelial / LPF MANY (*)    Bacteria, UA FEW (*)    All other components within normal limits  URINE CULTURE  POCT  PREGNANCY, URINE   No results found.   1. Nausea vomiting and diarrhea     6:22 AM Patient seen and examined. Work-up initiated. Medications ordered.   Vital signs reviewed and are as follows: Filed Vitals:   06/10/12 0425  BP: 128/96  Pulse: 100  Temp: 97.7 F (36.5 C)  Resp: 16   2L NS given ketones in urine. Pt feels better. She is drinking in room without vomiting.  The patient was urged to return to the Emergency Department immediately with worsening of current symptoms, worsening abdominal pain, persistent vomiting, blood noted in stools, fever, or any other concerns. The patient verbalized understanding.   D/c home with zofran and pepcid. Counseled on clear liquids and b.r.a.t. diet.     MDM  Patient with symptoms consistent with viral gastroenteritis.  Vitals are stable, no fever.  No signs of dehydration, tolerating PO's.  Lungs are clear.  No focal abdominal pain, no concern for appendicitis, cholecystitis, pancreatitis, ruptured viscus, UTI, kidney stone, or any other abdominal etiology.  Supportive therapy indicated with return if symptoms worsen.  Patient counseled.         [place], [place] 06/10/12 [phone]

## 2012-06-10 NOTE — ED Notes (Signed)
The pt has had lower abd and vomiting since 0200am. None at present alert no distress

## 2012-06-10 NOTE — ED Notes (Signed)
Since 0200 am pt. Has been vomiting, exp. Chills,

## 2012-06-11 LAB — URINE CULTURE: Colony Count: NO GROWTH

## 2012-06-11 NOTE — ED Provider Notes (Signed)
Medical screening examination/treatment/procedure(s) were performed by non-physician practitioner and as supervising physician I was immediately available for consultation/collaboration.  Doug Sou, MD 06/11/12 [phone]

## 2013-11-19 LAB — OB RESULTS CONSOLE HEPATITIS B SURFACE ANTIGEN: Hepatitis B Surface Ag: NEGATIVE

## 2013-11-19 LAB — OB RESULTS CONSOLE HIV ANTIBODY (ROUTINE TESTING): HIV: NONREACTIVE

## 2013-11-19 LAB — OB RESULTS CONSOLE RUBELLA ANTIBODY, IGM: Rubella: NON-IMMUNE/NOT IMMUNE

## 2013-11-19 LAB — OB RESULTS CONSOLE RPR: RPR: NONREACTIVE

## 2014-01-02 ENCOUNTER — Other Ambulatory Visit: Payer: Self-pay

## 2014-01-17 ENCOUNTER — Ambulatory Visit (HOSPITAL_COMMUNITY): Payer: BC Managed Care – PPO

## 2014-01-21 ENCOUNTER — Ambulatory Visit (HOSPITAL_COMMUNITY)
Admission: RE | Admit: 2014-01-21 | Discharge: 2014-01-21 | Disposition: A | Discharge status: Home / Self Care | Payer: BC Managed Care – PPO | Source: Ambulatory Visit | Attending: Obstetrics and Gynecology | Admitting: Obstetrics and Gynecology

## 2014-01-28 NOTE — Progress Notes (Signed)
Genetic Counseling  High-Risk Gestation Note  Appointment Date:  01/21/14 Referred By: Darlyn Chamber, MD Date of Birth:  Sep 29, 1985 Partner:  Barnabas Harries    Pregnancy History: G2P1 Estimated Date of Delivery: 06/07/14 Estimated Gestational Age: 30w3dAttending: MRenella Cunas MD  I met with Ms. MLucia Estelleand her partner, Mr. DBarnabas Harries for genetic counseling given that expanded pan-ethnic carrier screening through her OB office increased the risk for Ms. JLaddto be a carrier for Spinal Muscular Atrophy (SMA).   Ms. JKruczekhad expanded pan-ethnic carrier screening ([facility]) facilitated through her OB office. 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  Mr. DRudean Curtsubsequently had S[facility] carrier [facility] facilitated through Ms. JOffner OB office. Results of his SMA carrier screening were within normal range, with greater than or equal to 4 copies of SMN1 present. Thus, his risk to be an SMA carrier has been reduced to less than 1 in 3,000.   Spinal muscular atrophy (SMA) is characterized by progressive muscular weakness caused by the degeneration and loss of lower motor nerve cells in the spinal cord and brain stem. SMA has historically been classified into subtypes based on age of onset and severity, with the most common type, SMA I, having onset before age 250438 monthsand leading to severe involvement by age 25070years. Prenatal onset of SMA, proposed term of SMA 0, is characterized by severe joint contractures, facial diplegia, and respiratory failure. SMA II is characterized by onset between age 250448and 113 months SMA III is characterized by onset in childhood after age 26 months and SMA IV is characterized by adult onset.  SMA is caused by mutations in the SMN1 gene, with  associated effects from the SMN2 gene and the resulting phenotype spans a continuum of onset and severity, often without clear genotype to phenotype correlation.   SMA is an autosomal recessive (AR) condition, meaning that an individual has two copies of the nonworking gene to have the condition. In the general population, the majority of individuals have one copy of SMN1 gene on each chromosome (considered having the genes in trans); however approximately 4% of the population have two copies of the SMN1 gene on a single chromosome (considered having the genes in cis).  The number of SMN2 gene copies ranges from zero to five. Approximately 95-98% of individuals with a clinical diagnosis of SMA are homozygous for a deletion of SMN1, and approximately 2-7% of individuals with SMA are compound heterozygotes for a deletion of SMN1 and a mutation of SMN1 detectable by sequence analysis. The presence of three or more copies of SMN2 has been reported to correlate with milder phenotype.  However, given the overlap of subtypes of SMA, the genotype identified cannot accurately predict the subtype of SMA present and prognosis.  We discussed genes and autosomal recessive inheritance in detail. In autosomal recessive conditions, each pregnancy for two individuals who are carriers of the same condition has a 1 in 4 (25%) chance to inherit the condition. Each pregnancy together for a carrier couple would also have a 1 in 2 (50%) chance to be a carrier and a 1 in 4 (25%) chance to be neither a carrier nor affected. Typically, an individual inherits an autosomal recessive condition by inheriting one copy of the  nonworking gene change from each parent. However, in approximately 2% of cases of SMA, the affected individual with SMA has one de novo SMN1 mutation, in which case only one parent would be a carrier and full siblings with not be at an increased risk for SMA.  Males and females have equal chances to inherit the condition.   Carriers of AR conditions are typically not expected to develop symptoms of the disease. The majority of carriers for SMA have 1 copy of SMN1, due to a deletion of the second copy. However, a minority of patients can carry two copies of SMN1 but be considered silent carriers, due to carrying both SMN1 genes on the same chromosome, or in cis, also denoted as 2+0.     Carrier screening for SMA is performed via SMN1 dosage analysis. It does not determine the configuration of the SMN1 alleles (cis versus trans). Detection rate is approximately 95% but varies with ethnicity. Thus, a normal carrier screen would reduce but not eliminate the chance for the individual to be a carrier for SMA, which would in turn reduce but not eliminate the chance for SMA in a future pregnancy. We discussed that in the case that two individuals are identified to be carriers for SMA, prenatal diagnosis would be available via amniocentesis. The risks, benefits, and limitations of amniocentesis were reviewed including the associated 1 in 151-761 risk for complications, including spontaneous pregnancy loss.   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 This has been described more commonly in individuals with Ashkenazi Jewish and Asian ancestry. Thus, given this result and Ms. Oldenburg' ancestry, her chance to be a 2+0 SMA carrier is 1 in 55. Mr. Rudean Curt reportedly was found to have 4 copies of SMN1. We reviewed that carrier screening does not assess for point mutations in SMN1 and cannot assess for configuration of the SMN1 alleles. Given this and given the new mutation rate reported for affected individuals with SMA, the couple understands that this screening reduces but does not eliminate the risk for SMA in the current pregnancy.  Given the results of SMA carrier screening for both Ms. Bunnell and Mr. Dawkins, the risk for SMA in  the current pregnancy is approximately <1 in 40,800.   The couple stated that they were very comfortable with this risk assessment.   We discussed that her carrier screening was negative for the additional mutations/273 conditions screened. Thus, her risk to be a carrier for these additional conditions (listed separately in the laboratory report) has been reduced but not eliminated. The prevalence of each condition varies (and often varies with ethnicity). Thus the patient's background risk to be a carrier for each of these various conditions would range, and in some cases be very low or unknown. Similarly, the detection rate varies with each condition and also varies in some cases with ethnicity, ranging from greater than 99% to unknown. We reviewed that a negative carrier screen would thus reduce but not eliminate the chance to be a carrier for these conditions.   Both family histories were reviewed and were otherwise noncontributory for birth defects, intellectual disability, recurrent pregnancy loss, or known genetic conditions. Consanguinity was denied. Without further information regarding the provided family history, an accurate genetic risk cannot be calculated. Further genetic counseling is warranted if more information is obtained.   Ms. Jacqueli Pangallo denied exposure to environmental toxins or  chemical agents. She denied the use of alcohol, tobacco or street drugs. She denied significant viral illnesses during the course of her pregnancy. Her medical and surgical histories were noncontributory.   I counseled this couple regarding the above risks and available options. The approximate face-to-face time with the genetic counselor was 40 minutes.     Chipper Oman, MS Certified Genetic Counselor 01/28/2014

## 2014-02-08 ENCOUNTER — Other Ambulatory Visit: Payer: Self-pay

## 2014-02-08 ENCOUNTER — Encounter (HOSPITAL_COMMUNITY): Payer: Self-pay | Admitting: Emergency Medicine

## 2014-02-08 ENCOUNTER — Emergency Department (HOSPITAL_COMMUNITY)
Admission: EM | Admit: 2014-02-08 | Discharge: 2014-02-08 | Disposition: A | Discharge status: Home / Self Care | Payer: BC Managed Care – PPO | Attending: Emergency Medicine | Admitting: Emergency Medicine

## 2014-02-08 DIAGNOSIS — R0602 Shortness of breath: Secondary | ICD-10-CM | POA: Insufficient documentation

## 2014-02-08 DIAGNOSIS — Z79899 Other long term (current) drug therapy: Secondary | ICD-10-CM | POA: Insufficient documentation

## 2014-02-08 DIAGNOSIS — R079 Chest pain, unspecified: Secondary | ICD-10-CM | POA: Diagnosis present

## 2014-02-08 DIAGNOSIS — R0789 Other chest pain: Secondary | ICD-10-CM | POA: Diagnosis not present

## 2014-02-08 NOTE — Progress Notes (Signed)
Call for pt presenting at 23 weeks with chest pain. Arrived to find pt in no acute distress with no obstetrical complaints. Pt has regular care at [facility] and has her next routine OB appoint on Monday. FHR by doppler 135. Pt denies vag bleeding or UC's. Blood pressure stable. Reflexes +2 no clonus. Pt denies headache or blurry vision. EDP notified and pt doesn't require additional OB care for this visit.

## 2014-02-08 NOTE — Discharge Instructions (Signed)
You may take tylenol for your pain. Followup with both her primary care physician and your OB/GYN.  Chest Wall Pain Chest wall pain is pain in or around the bones and muscles of your chest. It may take up to 6 weeks to get better. It may take longer if you must stay physically active in your work and activities.  CAUSES  Chest wall pain may happen on its own. However, it may be caused by:  A viral illness like the flu.  Injury.  Coughing.  Exercise.  Arthritis.  Fibromyalgia.  Shingles. HOME CARE INSTRUCTIONS   Avoid overtiring physical activity. Try not to strain or perform activities that cause pain. This includes any activities using your chest or your abdominal and side muscles, especially if heavy weights are used.  Put ice on the sore area.  Put ice in a plastic bag.  Place a towel between your skin and the bag.  Leave the ice on for 15-20 minutes per hour while awake for the first 2 days.  Only take over-the-counter or prescription medicines for pain, discomfort, or fever as directed by your caregiver. SEEK IMMEDIATE MEDICAL CARE IF:   Your pain increases, or you are very uncomfortable.  You have a fever.  Your chest pain becomes worse.  You have new, unexplained symptoms.  You have nausea or vomiting.  You feel sweaty or lightheaded.  You have a cough with phlegm (sputum), or you cough up blood. MAKE SURE YOU:   Understand these instructions.  Will watch your condition.  Will get help right away if you are not doing well or get worse. Document Released: 04/18/2005 Document Revised: 07/11/2011 Document Reviewed: 12/13/2010 [facility] Patient Information 2015 [employer] [place]. This information is not intended to replace advice given to you by your health care provider. Make sure you discuss any questions you have with your health care provider.

## 2014-02-08 NOTE — ED Notes (Signed)
OB rapid response RN arrived and at bedside.

## 2014-02-08 NOTE — ED Notes (Signed)
Pt presents with central CP onset 1 hour with SHOB. Pt is 23 weeks preg, denies abd pain.

## 2014-02-08 NOTE — ED Provider Notes (Signed)
CSN: [id number]     Arrival date & time 02/08/14  0525 History   First MD Initiated Contact with Patient 02/08/14 [phone]     Chief Complaint  Patient presents with  . Chest Pain     (Consider location/radiation/quality/duration/timing/severity/associated sxs/prior Treatment) HPI Comments: This is a 28 y/o G2P1 female who is [redacted] weeks pregnant who presents to the emergency department complaining of midsternal sharp, constant chest pain with associated mild shortness of breath beginning about one hour prior to arrival. Pain is nonradiating, worse with movement, relieved by laying flat or not pressing on her chest. Denies ever having pain like this in the past. Denies abdominal pain, vaginal bleeding, lightheadedness, dizziness, weakness, numbness or tingling. She has an appointment with her OB/GYN in 2 days. Denies family history of early heart disease or blood clots. Denies nausea or vomiting.  Patient is a 28 y.o. female presenting with chest pain. The history is provided by the patient.  Chest Pain Associated symptoms: shortness of breath     History reviewed. No pertinent past medical history. History reviewed. No pertinent past surgical history. No family history on file. History  Substance Use Topics  . Smoking status: Never Smoker   . Smokeless tobacco: Not on file  . Alcohol Use: No   OB History   Grav Para Term Preterm Abortions TAB SAB Ect Mult Living                 Review of Systems  Respiratory: Positive for shortness of breath.   Cardiovascular: Positive for chest pain.  All other systems reviewed and are negative.     Allergies  Review of patient's allergies indicates no known allergies.  Home Medications   Prior to Admission medications   Medication Sig Start Date End Date Taking? Authorizing Provider  acetaminophen (TYLENOL) 325 MG tablet Take 650 mg by mouth every 6 (six) hours as needed for mild pain.   Yes Historical Provider, MD  ferrous sulfate 325 (65  FE) MG tablet Take 325 mg by mouth daily with breakfast.   Yes Historical Provider, MD  Prenatal Vit-Fe Fumarate-FA (PRENATAL MULTIVITAMIN) TABS tablet Take 1 tablet by mouth daily at 12 noon.   Yes Historical Provider, MD  Simethicone (GAS RELIEF PO) Take 1 tablet by mouth daily.   Yes Historical Provider, MD   BP 90/54  Pulse 81  Temp(Src) 98.1 F (36.7 C) (Oral)  Resp 17  Ht 5\' 2"  (1.575 m)  Wt 134 lb (60.782 kg)  BMI 24.50 kg/m2  SpO2 98%  LMP 08/31/2013 Physical Exam  Nursing note and vitals reviewed. Constitutional: She is oriented to person, place, and time. She appears well-developed and well-nourished. No distress.  HENT:  Head: Normocephalic and atraumatic.  Mouth/Throat: Oropharynx is clear and moist.  Eyes: Conjunctivae and EOM are normal. Pupils are equal, round, and reactive to light.  Neck: Normal range of motion. Neck supple. No JVD present.  Cardiovascular: Normal rate, regular rhythm, normal heart sounds and intact distal pulses.   No extremity edema.  Pulmonary/Chest: Effort normal and breath sounds normal. No respiratory distress. She has no wheezes. She has no rales. She exhibits tenderness.    Abdominal: Soft. Bowel sounds are normal. There is no tenderness.  Gravid abdomen.  Musculoskeletal: Normal range of motion. She exhibits no edema.  Neurological: She is alert and oriented to person, place, and time. She has normal strength. No sensory deficit.  Speech fluent, goal oriented. Moves limbs without ataxia. Equal grip strength bilateral.  Skin: Skin is warm and dry. She is not diaphoretic.  Psychiatric: She has a normal mood and affect. Her behavior is normal.    ED Course  Procedures (including critical care time) Labs Review Labs Reviewed - No data to display  Imaging Review No results found.   EKG Interpretation   Date/Time:  Saturday February 08 2014 05:40:05 EDT Ventricular Rate:  80 PR Interval:  142 QRS Duration: 91 QT Interval:   374 QTC Calculation: 431 R Axis:   72 Text Interpretation:  Sinus rhythm Confirmed by [facility]AS[facility]  MD, APRIL  ([id number]) on 02/08/2014 6:14:09 AM      MDM   Final diagnoses:  Chest wall pain   Patient presenting with chest pain. She is nontoxic-appearing and in no apparent distress. Afebrile, vital signs stable. No hypoxia. Rapid response nurse called, evaluated patient and cleared her from an OB standpoint. She has an appointment with her OB in 2 days. Doubt PE given the stability of vital signs and reproducible chest pain. Doubt cardiac. The pain patient is experiencing is the pain she gets when pressing on her chest. EKG unremarkable. Advised her that she can take Tylenol. Followup with OB/GYN and PCP. Stable for d/c. Return precautions given. Patient states understanding of treatment care plan and is agreeable.  Kathrynn SpeedRobyn M Albertus Chiarelli, PA-C 02/08/14 [phone]

## 2014-02-08 NOTE — ED Provider Notes (Signed)
Medical screening examination/treatment/procedure(s) were performed by non-physician practitioner and as supervising physician I was immediately available for consultation/collaboration.   EKG Interpretation   Date/Time:  Saturday February 08 2014 05:40:05 EDT Ventricular Rate:  80 PR Interval:  142 QRS Duration: 91 QT Interval:  374 QTC Calculation: 431 R Axis:   72 Text Interpretation:  Sinus rhythm Confirmed by [facility]  MD, Eulla Kochanowski  ([id number]) on 02/08/2014 6:14:09 AM       Fatumata Kashani Smitty CordsK Tenasia Aull-Rasch, MD 02/08/14 [phone]

## 2014-02-08 NOTE — ED Notes (Signed)
OB rapid response RN notified of [redacted] week pregnant pt in ED. Rapid response RN en route.

## 2014-03-12 ENCOUNTER — Inpatient Hospital Stay (HOSPITAL_COMMUNITY)
Admission: AD | Admit: 2014-03-12 | Discharge: 2014-03-12 | Disposition: A | Discharge status: Home / Self Care | Payer: BC Managed Care – PPO | Source: Ambulatory Visit | Attending: Obstetrics and Gynecology | Admitting: Obstetrics and Gynecology

## 2014-03-12 ENCOUNTER — Encounter (HOSPITAL_COMMUNITY): Payer: Self-pay

## 2014-03-12 DIAGNOSIS — M79609 Pain in unspecified limb: Secondary | ICD-10-CM

## 2014-03-12 DIAGNOSIS — Z3A27 27 weeks gestation of pregnancy: Secondary | ICD-10-CM | POA: Insufficient documentation

## 2014-03-12 DIAGNOSIS — R0602 Shortness of breath: Secondary | ICD-10-CM | POA: Diagnosis not present

## 2014-03-12 DIAGNOSIS — M79661 Pain in right lower leg: Secondary | ICD-10-CM | POA: Insufficient documentation

## 2014-03-12 DIAGNOSIS — R000 Tachycardia, unspecified: Secondary | ICD-10-CM | POA: Diagnosis not present

## 2014-03-12 DIAGNOSIS — M25561 Pain in right knee: Secondary | ICD-10-CM

## 2014-03-12 HISTORY — DX: Anemia, unspecified: D64.9

## 2014-03-12 LAB — URINALYSIS, ROUTINE W REFLEX MICROSCOPIC
Bilirubin Urine: NEGATIVE
Glucose, UA: NEGATIVE mg/dL
Hgb urine dipstick: NEGATIVE
Ketones, ur: NEGATIVE mg/dL
LEUKOCYTES UA: NEGATIVE
NITRITE: NEGATIVE
PH: 6 (ref 5.0–8.0)
Protein, ur: NEGATIVE mg/dL
UROBILINOGEN UA: 0.2 mg/dL (ref 0.0–1.0)

## 2014-03-12 NOTE — Progress Notes (Signed)
VASCULAR LAB PRELIMINARY  PRELIMINARY  PRELIMINARY  PRELIMINARY  Right lower extremity venous duplex completed.    Preliminary report:  Right:  No evidence of DVT, superficial thrombosis, or Baker's cyst.  Klarisa Barman, RVS 03/12/2014, 8:08 PM

## 2014-03-12 NOTE — MAU Provider Note (Signed)
History     CSN: [id number]  Arrival date and time: 03/12/14 1713   First Provider Initiated Contact with Patient 03/12/14 1754      Chief Complaint  Patient presents with  . Leg Pain  . Shortness of Breath   HPI  [name] is a 28 y.o. G1P0 at 5744w4d who presents to MAU today with complaint of right calf pain since this morning and SOB. She states that the SOB has been going on "for a while" off and on, but was worse and constant today. She denies chest pain, but did feel that she was slightly tachycardic earlier today. She states that leg pain was noted this morning. It is on the right side only in the area of the calf. She denies swelling, bruising, discoloration, prominent veins, history of varicose veins or blood disorders. She states normal fetal movement. She denies contractions, vaginal bleeding, discharge or LOF.    OB History    Gravida Para Term Preterm AB TAB SAB Ectopic Multiple Living   1               Past Medical History  Diagnosis Date  . Anemia     No past surgical history on file.  No family history on file.  History  Substance Use Topics  . Smoking status: Never Smoker   . Smokeless tobacco: Not on file  . Alcohol Use: No    Allergies: No Known Allergies  Prescriptions prior to admission  Medication Sig Dispense Refill Last Dose  . acetaminophen (TYLENOL) 325 MG tablet Take 650 mg by mouth every 6 (six) hours as needed for mild pain.   02/08/2014 at Unknown time  . ferrous sulfate 325 (65 FE) MG tablet Take 325 mg by mouth daily with breakfast.   02/07/2014 at Unknown time  . Prenatal Vit-Fe Fumarate-FA (PRENATAL MULTIVITAMIN) TABS tablet Take 1 tablet by mouth daily at 12 noon.   02/07/2014 at Unknown time  . Simethicone (GAS RELIEF PO) Take 1 tablet by mouth daily.   02/07/2014 at Unknown time    Review of Systems  Constitutional: Negative for fever and malaise/fatigue.  Respiratory: Positive for shortness of breath.   Cardiovascular:  Positive for leg swelling. Negative for chest pain.  Gastrointestinal: Negative for abdominal pain.  Genitourinary:       Neg - vaginal bleeding, discharge, LOF   Physical Exam   Blood pressure 111/66, pulse 88, temperature 99 F (37.2 C), temperature source Oral, resp. rate 16, height 5\' 3"  (1.6 m), weight 147 lb 9.6 oz (66.951 kg), SpO2 100 %.  Physical Exam  Constitutional: She is oriented to person, place, and time. She appears well-developed and well-nourished. No distress.  HENT:  Head: Normocephalic.  Cardiovascular: Normal rate, regular rhythm and normal heart sounds.   Respiratory: Effort normal and breath sounds normal. No respiratory distress. She has no wheezes. She has no rales. She exhibits no tenderness.  GI: Soft.  Musculoskeletal: She exhibits no edema or tenderness.  Neurological: She is alert and oriented to person, place, and time.  Reflex Scores:      Patellar reflexes are 2+ on the right side and 2+ on the left side. No clonus Negative Homan's sign  Skin: Skin is warm and dry. No erythema.  Psychiatric: She has a normal mood and affect.    Fetal Monitoring: Baseline: 140 bpm, moderate variability, + accelerations (few 15x15 and many 10x10), no decelerations Contractions: none  MAU Course  Procedures  None  MDM  Discussed with Dr. Thana AtesGrewel. Venous Duplex of right calf to r/o DVT.  Duplex - negative Discussed results with Dr. Thana AtesGrewel. Patient has continued O2 saturation of 99-100% without any evidence of respiratory distress.  Patient is ok for discharge per Dr. Thana AtesGrewel. Follow-up in the office as scheduled Assessment and Plan  A: SIUP at 2573w4d Right leg pain  SOB  P: Discharge home Patient advised of warning signs for DVT and PE Patient advised to follow-up in the office as scheduled for routine prenatal care or sooner PRN Patient may return to MAU as needed or if her condition were to change or worsen   Marny LowensteinJulie N Ivalee Strauser, PA-C  03/12/2014, 8:10 PM

## 2014-03-12 NOTE — MAU Note (Signed)
Pt states she felt pain this morning when she got out of bed, pt states pain in her right leg is constant.

## 2014-03-12 NOTE — MAU Note (Signed)
Patient states she started heaving shortness of breath today and had an irregular heart rate when she was feeling this way. States she has had pain in the right calf today. Having some mild cramping. Denies bleeding or leaking and reports good fetal movement. Nausea, no vomiting.

## 2014-03-12 NOTE — Discharge Instructions (Signed)

## 2014-05-02 NOTE — L&D Delivery Note (Signed)
Delivery Note At 11:01 AM a viable female was delivered via  (Presentation:OA ;  ).  APGAR:8/9 , ; weight  .   Placenta statusspont>>intact : , .  Cord:  with the following complications: .  Cord pH: not sent  Anesthesia:  epid Episiotomy:  none Lacerations:  none Suture Repair: na Est. Blood Loss (mL):  300  Mom to postpartum.  Baby to Couplet care / Skin to Skin.  Daire Okimoto M 06/03/2014, 11:08 AM

## 2014-05-20 LAB — OB RESULTS CONSOLE GBS: STREP GROUP B AG: POSITIVE

## 2014-05-28 ENCOUNTER — Inpatient Hospital Stay (HOSPITAL_COMMUNITY)
Admission: AD | Admit: 2014-05-28 | Discharge: 2014-05-28 | Disposition: A | Discharge status: Home / Self Care | Payer: BLUE CROSS/BLUE SHIELD | Source: Ambulatory Visit | Attending: Obstetrics and Gynecology | Admitting: Obstetrics and Gynecology

## 2014-05-28 ENCOUNTER — Encounter (HOSPITAL_COMMUNITY): Payer: Self-pay | Admitting: *Deleted

## 2014-05-28 DIAGNOSIS — Z3A38 38 weeks gestation of pregnancy: Secondary | ICD-10-CM | POA: Insufficient documentation

## 2014-05-28 DIAGNOSIS — O471 False labor at or after 37 completed weeks of gestation: Secondary | ICD-10-CM | POA: Insufficient documentation

## 2014-05-28 NOTE — Discharge Instructions (Signed)
Third Trimester of Pregnancy °The third trimester is from week 29 through week 42, months 7 through 9. The third trimester is a time when the fetus is growing rapidly. At the end of the ninth month, the fetus is about 20 inches in length and weighs 6-10 pounds.  °BODY CHANGES °Your body goes through many changes during pregnancy. The changes vary from woman to woman.  °· Your weight will continue to increase. You can expect to gain 25-35 pounds (11-16 kg) by the end of the pregnancy. °· You may begin to get stretch marks on your hips, abdomen, and breasts. °· You may urinate more often because the fetus is moving lower into your pelvis and pressing on your bladder. °· You may develop or continue to have heartburn as a result of your pregnancy. °· You may develop constipation because certain hormones are causing the muscles that push waste through your intestines to slow down. °· You may develop hemorrhoids or swollen, bulging veins (varicose veins). °· You may have pelvic pain because of the weight gain and pregnancy hormones relaxing your joints between the bones in your pelvis. Backaches may result from overexertion of the muscles supporting your posture. °· You may have changes in your hair. These can include thickening of your hair, rapid growth, and changes in texture. Some women also have hair loss during or after pregnancy, or hair that feels dry or thin. Your hair will most likely return to normal after your baby is born. °· Your breasts will continue to grow and be tender. A yellow discharge may leak from your breasts called colostrum. °· Your belly button may stick out. °· You may feel short of breath because of your expanding uterus. °· You may notice the fetus "dropping," or moving lower in your abdomen. °· You may have a bloody mucus discharge. This usually occurs a few days to a week before labor begins. °· Your cervix becomes thin and soft (effaced) near your due date. °WHAT TO EXPECT AT YOUR PRENATAL  EXAMS  °You will have prenatal exams every 2 weeks until week 36. Then, you will have weekly prenatal exams. During a routine prenatal visit: °· You will be weighed to make sure you and the fetus are growing normally. °· Your blood pressure is taken. °· Your abdomen will be measured to track your baby's growth. °· The fetal heartbeat will be listened to. °· Any test results from the previous visit will be discussed. °· You may have a cervical check near your due date to see if you have effaced. °At around 36 weeks, your caregiver will check your cervix. At the same time, your caregiver will also perform a test on the secretions of the vaginal tissue. This test is to determine if a type of bacteria, Group B streptococcus, is present. Your caregiver will explain this further. °Your caregiver may ask you: °· What your birth plan is. °· How you are feeling. °· If you are feeling the baby move. °· If you have had any abnormal symptoms, such as leaking fluid, bleeding, severe headaches, or abdominal cramping. °· If you have any questions. °Other tests or screenings that may be performed during your third trimester include: °· Blood tests that check for low iron levels (anemia). °· Fetal testing to check the health, activity level, and growth of the fetus. Testing is done if you have certain medical conditions or if there are problems during the pregnancy. °FALSE LABOR °You may feel small, irregular contractions that   eventually go away. These are called Braxton Hicks contractions, or false labor. Contractions may last for hours, days, or even weeks before true labor sets in. If contractions come at regular intervals, intensify, or become painful, it is best to be seen by your caregiver.  °SIGNS OF LABOR  °· Menstrual-like cramps. °· Contractions that are 5 minutes apart or less. °· Contractions that start on the top of the uterus and spread down to the lower abdomen and back. °· A sense of increased pelvic pressure or back  pain. °· A watery or bloody mucus discharge that comes from the vagina. °If you have any of these signs before the 37th week of pregnancy, call your caregiver right away. You need to go to the hospital to get checked immediately. °HOME CARE INSTRUCTIONS  °· Avoid all smoking, herbs, alcohol, and unprescribed drugs. These chemicals affect the formation and growth of the baby. °· Follow your caregiver's instructions regarding medicine use. There are medicines that are either safe or unsafe to take during pregnancy. °· Exercise only as directed by your caregiver. Experiencing uterine cramps is a good sign to stop exercising. °· Continue to eat regular, healthy meals. °· Wear a good support bra for breast tenderness. °· Do not use hot tubs, steam rooms, or saunas. °· Wear your seat belt at all times when driving. °· Avoid raw meat, uncooked cheese, cat litter boxes, and soil used by cats. These carry germs that can cause birth defects in the baby. °· Take your prenatal vitamins. °· Try taking a stool softener (if your caregiver approves) if you develop constipation. Eat more high-fiber foods, such as fresh vegetables or fruit and whole grains. Drink plenty of fluids to keep your urine clear or pale yellow. °· Take warm sitz baths to soothe any pain or discomfort caused by hemorrhoids. Use hemorrhoid cream if your caregiver approves. °· If you develop varicose veins, wear support hose. Elevate your feet for 15 minutes, 3-4 times a day. Limit salt in your diet. °· Avoid heavy lifting, wear low heal shoes, and practice good posture. °· Rest a lot with your legs elevated if you have leg cramps or low back pain. °· Visit your dentist if you have not gone during your pregnancy. Use a soft toothbrush to brush your teeth and be gentle when you floss. °· A sexual relationship may be continued unless your caregiver directs you otherwise. °· Do not travel far distances unless it is absolutely necessary and only with the approval  of your caregiver. °· Take prenatal classes to understand, practice, and ask questions about the labor and delivery. °· Make a trial run to the hospital. °· Pack your hospital bag. °· Prepare the baby's nursery. °· Continue to go to all your prenatal visits as directed by your caregiver. °SEEK MEDICAL CARE IF: °· You are unsure if you are in labor or if your water has broken. °· You have dizziness. °· You have mild pelvic cramps, pelvic pressure, or nagging pain in your abdominal area. °· You have persistent nausea, vomiting, or diarrhea. °· You have a bad smelling vaginal discharge. °· You have pain with urination. °SEEK IMMEDIATE MEDICAL CARE IF:  °· You have a fever. °· You are leaking fluid from your vagina. °· You have spotting or bleeding from your vagina. °· You have severe abdominal cramping or pain. °· You have rapid weight loss or gain. °· You have shortness of breath with chest pain. °· You notice sudden or extreme swelling   of your face, hands, ankles, feet, or legs. °· You have not felt your baby move in over an hour. °· You have severe headaches that do not go away with medicine. °· You have vision changes. °Document Released: 04/12/2001 Document Revised: 04/23/2013 Document Reviewed: 06/19/2012 °[employer]® Patient Information ©2015 [employer]. This information is not intended to replace advice given to you by your health care provider. Make sure you discuss any questions you have with your health care provider. °Fetal Movement Counts °Patient Name: __________________________________________________ Patient Due Date: ____________________ °Performing a fetal movement count is highly recommended in high-risk pregnancies, but it is good for every pregnant woman to do. Your health care provider may ask you to start counting fetal movements at 28 weeks of the pregnancy. Fetal movements often increase: °· After eating a full meal. °· After physical activity. °· After eating or drinking something sweet or  cold. °· At rest. °Pay attention to when you feel the baby is most active. This will help you notice a pattern of your baby's sleep and wake cycles and what factors contribute to an increase in fetal movement. It is important to perform a fetal movement count at the same time each day when your baby is normally most active.  °HOW TO COUNT FETAL MOVEMENTS °· Find a quiet and comfortable area to sit or lie down on your left side. Lying on your left side provides the best blood and oxygen circulation to your baby. °· Write down the day and time on a sheet of paper or in a journal. °· Start counting kicks, flutters, swishes, rolls, or jabs in a 2-hour period. You should feel at least 10 movements within 2 hours. °· If you do not feel 10 movements in 2 hours, wait 2-3 hours and count again. Look for a change in the pattern or not enough counts in 2 hours. °SEEK MEDICAL CARE IF: °· You feel less than 10 counts in 2 hours, tried twice. °· There is no movement in over an hour. °· The pattern is changing or taking longer each day to reach 10 counts in 2 hours. °· You feel the baby is not moving as he or she usually does. °Date: ____________ Movements: ____________ Start time: ____________ Finish time: ____________  °Date: ____________ Movements: ____________ Start time: ____________ Finish time: ____________ °Date: ____________ Movements: ____________ Start time: ____________ Finish time: ____________ °Date: ____________ Movements: ____________ Start time: ____________ Finish time: ____________ °Date: ____________ Movements: ____________ Start time: ____________ Finish time: ____________ °Date: ____________ Movements: ____________ Start time: ____________ Finish time: ____________ °Date: ____________ Movements: ____________ Start time: ____________ Finish time: ____________ °Date: ____________ Movements: ____________ Start time: ____________ Finish time: ____________  °Date: ____________ Movements: ____________ Start time:  ____________ Finish time: ____________ °Date: ____________ Movements: ____________ Start time: ____________ Finish time: ____________ °Date: ____________ Movements: ____________ Start time: ____________ Finish time: ____________ °Date: ____________ Movements: ____________ Start time: ____________ Finish time: ____________ °Date: ____________ Movements: ____________ Start time: ____________ Finish time: ____________ °Date: ____________ Movements: ____________ Start time: ____________ Finish time: ____________ °Date: ____________ Movements: ____________ Start time: ____________ Finish time: ____________  °Date: ____________ Movements: ____________ Start time: ____________ Finish time: ____________ °Date: ____________ Movements: ____________ Start time: ____________ Finish time: ____________ °Date: ____________ Movements: ____________ Start time: ____________ Finish time: ____________ °Date: ____________ Movements: ____________ Start time: ____________ Finish time: ____________ °Date: ____________ Movements: ____________ Start time: ____________ Finish time: ____________ °Date: ____________ Movements: ____________ Start time: ____________ Finish time: ____________ °Date: ____________ Movements: ____________ Start time: ____________ Finish time:   ____________  °Date: ____________ Movements: ____________ Start time: ____________ Finish time: ____________ °Date: ____________ Movements: ____________ Start time: ____________ Finish time: ____________ °Date: ____________ Movements: ____________ Start time: ____________ Finish time: ____________ °Date: ____________ Movements: ____________ Start time: ____________ Finish time: ____________ °Date: ____________ Movements: ____________ Start time: ____________ Finish time: ____________ °Date: ____________ Movements: ____________ Start time: ____________ Finish time: ____________ °Date: ____________ Movements: ____________ Start time: ____________ Finish time: ____________  °Date:  ____________ Movements: ____________ Start time: ____________ Finish time: ____________ °Date: ____________ Movements: ____________ Start time: ____________ Finish time: ____________ °Date: ____________ Movements: ____________ Start time: ____________ Finish time: ____________ °Date: ____________ Movements: ____________ Start time: ____________ Finish time: ____________ °Date: ____________ Movements: ____________ Start time: ____________ Finish time: ____________ °Date: ____________ Movements: ____________ Start time: ____________ Finish time: ____________ °Date: ____________ Movements: ____________ Start time: ____________ Finish time: ____________  °Date: ____________ Movements: ____________ Start time: ____________ Finish time: ____________ °Date: ____________ Movements: ____________ Start time: ____________ Finish time: ____________ °Date: ____________ Movements: ____________ Start time: ____________ Finish time: ____________ °Date: ____________ Movements: ____________ Start time: ____________ Finish time: ____________ °Date: ____________ Movements: ____________ Start time: ____________ Finish time: ____________ °Date: ____________ Movements: ____________ Start time: ____________ Finish time: ____________ °Date: ____________ Movements: ____________ Start time: ____________ Finish time: ____________  °Date: ____________ Movements: ____________ Start time: ____________ Finish time: ____________ °Date: ____________ Movements: ____________ Start time: ____________ Finish time: ____________ °Date: ____________ Movements: ____________ Start time: ____________ Finish time: ____________ °Date: ____________ Movements: ____________ Start time: ____________ Finish time: ____________ °Date: ____________ Movements: ____________ Start time: ____________ Finish time: ____________ °Date: ____________ Movements: ____________ Start time: ____________ Finish time: ____________ °Date: ____________ Movements: ____________ Start  time: ____________ Finish time: ____________  °Date: ____________ Movements: ____________ Start time: ____________ Finish time: ____________ °Date: ____________ Movements: ____________ Start time: ____________ Finish time: ____________ °Date: ____________ Movements: ____________ Start time: ____________ Finish time: ____________ °Date: ____________ Movements: ____________ Start time: ____________ Finish time: ____________ °Date: ____________ Movements: ____________ Start time: ____________ Finish time: ____________ °Date: ____________ Movements: ____________ Start time: ____________ Finish time: ____________ °Document Released: 05/18/2006 Document Revised: 09/02/2013 Document Reviewed: 02/13/2012 °[employer]® Patient Information ©2015 [employer]. This information is not intended to replace advice given to you by your health care provider. Make sure you discuss any questions you have with your health care provider. ° °

## 2014-06-03 ENCOUNTER — Inpatient Hospital Stay (HOSPITAL_COMMUNITY)
Admission: AD | Admit: 2014-06-03 | Discharge: 2014-06-05 | DRG: 775 | Disposition: A | Discharge status: Home / Self Care | Payer: BLUE CROSS/BLUE SHIELD | Source: Ambulatory Visit | Attending: Obstetrics and Gynecology | Admitting: Obstetrics and Gynecology

## 2014-06-03 ENCOUNTER — Inpatient Hospital Stay (HOSPITAL_COMMUNITY): Payer: BLUE CROSS/BLUE SHIELD | Admitting: Anesthesiology

## 2014-06-03 ENCOUNTER — Encounter (HOSPITAL_COMMUNITY): Payer: Self-pay

## 2014-06-03 DIAGNOSIS — Z3480 Encounter for supervision of other normal pregnancy, unspecified trimester: Secondary | ICD-10-CM

## 2014-06-03 DIAGNOSIS — Z3A39 39 weeks gestation of pregnancy: Secondary | ICD-10-CM | POA: Diagnosis present

## 2014-06-03 DIAGNOSIS — O99824 Streptococcus B carrier state complicating childbirth: Principal | ICD-10-CM | POA: Diagnosis present

## 2014-06-03 DIAGNOSIS — Z3483 Encounter for supervision of other normal pregnancy, third trimester: Secondary | ICD-10-CM | POA: Diagnosis present

## 2014-06-03 HISTORY — DX: Herpesviral infection, unspecified: B00.9

## 2014-06-03 LAB — CBC
HCT: 32.2 % — ABNORMAL LOW (ref 36.0–46.0)
Hemoglobin: 10.6 g/dL — ABNORMAL LOW (ref 12.0–15.0)
MCH: 27.7 pg (ref 26.0–34.0)
MCHC: 32.9 g/dL (ref 30.0–36.0)
MCV: 84.3 fL (ref 78.0–100.0)
PLATELETS: 193 10*3/uL (ref 150–400)
RBC: 3.82 MIL/uL — AB (ref 3.87–5.11)
RDW: 13.9 % (ref 11.5–15.5)
WBC: 6.4 10*3/uL (ref 4.0–10.5)

## 2014-06-03 LAB — TYPE AND SCREEN
ABO/RH(D): B POS
Antibody Screen: NEGATIVE

## 2014-06-03 LAB — ABO/RH: ABO/RH(D): B POS

## 2014-06-03 MED ORDER — FENTANYL 2.5 MCG/ML BUPIVACAINE 1/10 % EPIDURAL INFUSION (WH - ANES)
14.0000 mL/h | INTRAMUSCULAR | Status: DC | PRN
  Administered 2014-06-03: 14 mL/h via EPIDURAL
  Filled 2014-06-03: qty 125

## 2014-06-03 MED ORDER — TETANUS-DIPHTH-ACELL PERTUSSIS 5-2.5-18.5 LF-MCG/0.5 IM SUSP
0.5000 mL | Freq: Once | INTRAMUSCULAR | Status: DC
  Filled 2014-06-03: qty 0.5

## 2014-06-03 MED ORDER — FENTANYL 2.5 MCG/ML BUPIVACAINE 1/10 % EPIDURAL INFUSION (WH - ANES)
INTRAMUSCULAR | Status: DC | PRN
  Administered 2014-06-03: 14 mL/h via EPIDURAL

## 2014-06-03 MED ORDER — LACTATED RINGERS IV SOLN: 500.0000 mL | INTRAVENOUS | Status: DC | PRN

## 2014-06-03 MED ORDER — DIBUCAINE 1 % RE OINT
1.0000 "application " | TOPICAL_OINTMENT | RECTAL | Status: DC | PRN
  Filled 2014-06-03: qty 28

## 2014-06-03 MED ORDER — SIMETHICONE 80 MG PO CHEW: 80.0000 mg | CHEWABLE_TABLET | ORAL | Status: DC | PRN

## 2014-06-03 MED ORDER — OXYTOCIN 40 UNITS IN LACTATED RINGERS INFUSION - SIMPLE MED
62.5000 mL/h | INTRAVENOUS | Status: DC
  Filled 2014-06-03: qty 1000

## 2014-06-03 MED ORDER — LACTATED RINGERS IV SOLN: 500.0000 mL | Freq: Once | INTRAVENOUS | Status: DC

## 2014-06-03 MED ORDER — OXYCODONE-ACETAMINOPHEN 5-325 MG PO TABS
2.0000 | ORAL_TABLET | ORAL | Status: DC | PRN
Start: 2014-06-03 — End: 2014-06-05
  Administered 2014-06-04 – 2014-06-05 (×3): 2 via ORAL
  Filled 2014-06-03 (×4): qty 2

## 2014-06-03 MED ORDER — FLEET ENEMA 7-19 GM/118ML RE ENEM: 1.0000 | ENEMA | Freq: Every day | RECTAL | Status: DC | PRN

## 2014-06-03 MED ORDER — CITRIC ACID-SODIUM CITRATE 334-500 MG/5ML PO SOLN: 30.0000 mL | ORAL | Status: DC | PRN

## 2014-06-03 MED ORDER — PRENATAL MULTIVITAMIN CH
1.0000 | ORAL_TABLET | Freq: Every day | ORAL | Status: DC
  Administered 2014-06-04 – 2014-06-05 (×2): 1 via ORAL
  Filled 2014-06-03 (×2): qty 1

## 2014-06-03 MED ORDER — DIPHENHYDRAMINE HCL 25 MG PO CAPS: 25.0000 mg | ORAL_CAPSULE | Freq: Four times a day (QID) | ORAL | Status: DC | PRN

## 2014-06-03 MED ORDER — OXYCODONE-ACETAMINOPHEN 5-325 MG PO TABS: 2.0000 | ORAL_TABLET | ORAL | Status: DC | PRN

## 2014-06-03 MED ORDER — OXYCODONE-ACETAMINOPHEN 5-325 MG PO TABS
1.0000 | ORAL_TABLET | ORAL | Status: DC | PRN
  Administered 2014-06-04 (×2): 1 via ORAL
  Filled 2014-06-03 (×2): qty 1

## 2014-06-03 MED ORDER — EPHEDRINE 5 MG/ML INJ: 10.0000 mg | INTRAVENOUS | Status: DC | PRN

## 2014-06-03 MED ORDER — SENNOSIDES-DOCUSATE SODIUM 8.6-50 MG PO TABS
2.0000 | ORAL_TABLET | ORAL | Status: DC
  Administered 2014-06-04 (×2): 2 via ORAL
  Filled 2014-06-03 (×2): qty 2

## 2014-06-03 MED ORDER — PHENYLEPHRINE 40 MCG/ML (10ML) SYRINGE FOR IV PUSH (FOR BLOOD PRESSURE SUPPORT)
80.0000 ug | PREFILLED_SYRINGE | INTRAVENOUS | Status: DC | PRN
  Filled 2014-06-03: qty 20

## 2014-06-03 MED ORDER — WITCH HAZEL-GLYCERIN EX PADS: 1.0000 "application " | MEDICATED_PAD | CUTANEOUS | Status: DC | PRN

## 2014-06-03 MED ORDER — ACETAMINOPHEN 325 MG PO TABS: 650.0000 mg | ORAL_TABLET | ORAL | Status: DC | PRN

## 2014-06-03 MED ORDER — FLEET ENEMA 7-19 GM/118ML RE ENEM: 1.0000 | ENEMA | RECTAL | Status: DC | PRN

## 2014-06-03 MED ORDER — LANOLIN HYDROUS EX OINT: TOPICAL_OINTMENT | CUTANEOUS | Status: DC | PRN

## 2014-06-03 MED ORDER — LIDOCAINE HCL (PF) 1 % IJ SOLN
INTRAMUSCULAR | Status: DC | PRN
  Administered 2014-06-03 (×2): 8 mL

## 2014-06-03 MED ORDER — ONDANSETRON HCL 4 MG/2ML IJ SOLN
4.0000 mg | INTRAMUSCULAR | Status: DC | PRN
Start: 2014-06-03 — End: 2014-06-05

## 2014-06-03 MED ORDER — BISACODYL 10 MG RE SUPP
10.0000 mg | Freq: Every day | RECTAL | Status: DC | PRN
  Filled 2014-06-03: qty 1

## 2014-06-03 MED ORDER — OXYCODONE-ACETAMINOPHEN 5-325 MG PO TABS: 1.0000 | ORAL_TABLET | ORAL | Status: DC | PRN

## 2014-06-03 MED ORDER — ONDANSETRON HCL 4 MG/2ML IJ SOLN: 4.0000 mg | Freq: Four times a day (QID) | INTRAMUSCULAR | Status: DC | PRN

## 2014-06-03 MED ORDER — BENZOCAINE-MENTHOL 20-0.5 % EX AERO
1.0000 "application " | INHALATION_SPRAY | CUTANEOUS | Status: DC | PRN
  Filled 2014-06-03 (×2): qty 56

## 2014-06-03 MED ORDER — LIDOCAINE HCL (PF) 1 % IJ SOLN
30.0000 mL | INTRAMUSCULAR | Status: DC | PRN
  Filled 2014-06-03: qty 30

## 2014-06-03 MED ORDER — IBUPROFEN 800 MG PO TABS
800.0000 mg | ORAL_TABLET | Freq: Three times a day (TID) | ORAL | Status: DC | PRN
  Administered 2014-06-03 – 2014-06-05 (×6): 800 mg via ORAL
  Filled 2014-06-03 (×6): qty 1

## 2014-06-03 MED ORDER — PENICILLIN G POTASSIUM 5000000 UNITS IJ SOLR
5.0000 10*6.[IU] | Freq: Once | INTRAVENOUS | Status: AC
  Administered 2014-06-03: 5 10*6.[IU] via INTRAVENOUS
  Filled 2014-06-03: qty 5

## 2014-06-03 MED ORDER — DIPHENHYDRAMINE HCL 50 MG/ML IJ SOLN: 12.5000 mg | INTRAMUSCULAR | Status: DC | PRN

## 2014-06-03 MED ORDER — PENICILLIN G POTASSIUM 5000000 UNITS IJ SOLR
2.5000 10*6.[IU] | INTRAMUSCULAR | Status: DC
  Filled 2014-06-03 (×5): qty 2.5

## 2014-06-03 MED ORDER — ONDANSETRON HCL 4 MG PO TABS: 4.0000 mg | ORAL_TABLET | ORAL | Status: DC | PRN

## 2014-06-03 MED ORDER — LACTATED RINGERS IV SOLN
INTRAVENOUS | Status: DC
  Administered 2014-06-03 (×2): via INTRAVENOUS

## 2014-06-03 MED ORDER — PHENYLEPHRINE 40 MCG/ML (10ML) SYRINGE FOR IV PUSH (FOR BLOOD PRESSURE SUPPORT): 80.0000 ug | PREFILLED_SYRINGE | INTRAVENOUS | Status: DC | PRN

## 2014-06-03 MED ORDER — MEASLES, MUMPS & RUBELLA VAC ~~LOC~~ INJ
0.5000 mL | INJECTION | Freq: Once | SUBCUTANEOUS | Status: AC
  Administered 2014-06-05: 0.5 mL via SUBCUTANEOUS
  Filled 2014-06-03: qty 0.5

## 2014-06-03 MED ORDER — ZOLPIDEM TARTRATE 5 MG PO TABS: 5.0000 mg | ORAL_TABLET | Freq: Every evening | ORAL | Status: DC | PRN

## 2014-06-03 MED ORDER — OXYTOCIN BOLUS FROM INFUSION
500.0000 mL | INTRAVENOUS | Status: DC
  Administered 2014-06-03: 500 mL via INTRAVENOUS

## 2014-06-03 NOTE — Plan of Care (Signed)
Problem: Consults Goal: Birthing Suites Patient Information Press F2 to bring up selections list Outcome: Completed/Met Date Met:  06/03/14  Pt 37-[redacted] weeks EGA

## 2014-06-03 NOTE — Anesthesia Procedure Notes (Signed)
Epidural Patient location during procedure: OB Start time: 06/03/2014 7:33 AM End time: 06/03/2014 7:37 AM  Staffing Anesthesiologist: Leilani AbleHATCHETT, Masiyah Engen Performed by: anesthesiologist   Preanesthetic Checklist Completed: patient identified, surgical consent, pre-op evaluation, timeout performed, IV checked, risks and benefits discussed and monitors and equipment checked  Epidural Patient position: sitting Prep: site prepped and draped and DuraPrep Patient monitoring: continuous pulse ox and blood pressure Approach: midline Location: L3-L4 Injection technique: LOR air  Needle:  Needle type: Tuohy  Needle gauge: 17 G Needle length: 9 cm and 9 Needle insertion depth: 4 cm Catheter type: closed end flexible Catheter size: 19 Gauge Catheter at skin depth: 9 cm Test dose: negative and Other  Assessment Sensory level: T9 Events: blood not aspirated, injection not painful, no injection resistance, negative IV test and no paresthesia

## 2014-06-03 NOTE — Anesthesia Preprocedure Evaluation (Signed)
Anesthesia Evaluation  Patient identified by MRN, date of birth, ID band Patient awake    Reviewed: Allergy & Precautions, H&P , NPO status , Patient's Chart, lab work & pertinent test results  Airway Mallampati: II TM Distance: >3 FB Neck ROM: full    Dental no notable dental hx.    Pulmonary neg pulmonary ROS,    Pulmonary exam normal       Cardiovascular negative cardio ROS      Neuro/Psych negative neurological ROS  negative psych ROS   GI/Hepatic negative GI ROS, Neg liver ROS,   Endo/Other  negative endocrine ROS  Renal/GU negative Renal ROS     Musculoskeletal   Abdominal Normal abdominal exam  (+)   Peds  Hematology   Anesthesia Other Findings   Reproductive/Obstetrics (+) Pregnancy                           Anesthesia Physical Anesthesia Plan  ASA: II  Anesthesia Plan: Epidural   Post-op Pain Management:    Induction:   Airway Management Planned:   Additional Equipment:   Intra-op Plan:   Post-operative Plan:   Informed Consent: I have reviewed the patients History and Physical, chart, labs and discussed the procedure including the risks, benefits and alternatives for the proposed anesthesia with the patient or authorized representative who has indicated his/her understanding and acceptance.     Plan Discussed with:   Anesthesia Plan Comments:         Anesthesia Quick Evaluation  

## 2014-06-03 NOTE — MAU Note (Signed)
Pt in room, amb to BR to cleanse with CHG wipes and void. Called to notify front desk on callbell.

## 2014-06-03 NOTE — H&P (Signed)
Kelly ColumbiaMichelle Deist is a 29 y.o. female presenting for SOL. Maternal Medical History:  Reason for admission: Contractions.   Contractions: Onset was 3-5 hours ago.   Frequency: regular.    Fetal activity: Perceived fetal activity is normal.      OB History    Gravida Para Term Preterm AB TAB SAB Ectopic Multiple Living   2 1 1       1      Past Medical History  Diagnosis Date  . Anemia    History reviewed. No pertinent past surgical history. Family History: family history is not on file. Social History:  reports that she has never smoked. She has never used smokeless tobacco. She reports that she does not drink alcohol or use illicit drugs.   Prenatal Transfer Tool  Maternal Diabetes: No Genetic Screening: Normal Maternal Ultrasounds/Referrals: Normal Fetal Ultrasounds or other Referrals:  None Maternal Substance Abuse:  No Significant Maternal Medications:  None Significant Maternal Lab Results:  None Other Comments:  None  ROS  Dilation: 5.5 Effacement (%): 90 Station: -2 Exam by:: dr Marcelle Overlieholland Blood pressure 124/76, pulse 88, temperature 97.8 F (36.6 C), temperature source Oral, resp. rate 20, height 5\' 2"  (1.575 m), weight 172 lb 6.4 oz (78.2 kg), last menstrual period 08/31/2013, SpO2 99 %. Maternal Exam:  Uterine Assessment: Contraction strength is moderate.  Contraction frequency is regular.   Abdomen: Patient reports no abdominal tenderness. Fundal height is term FH.   Estimated fetal weight is AGA.   Fetal presentation: vertex  Introitus: Normal vulva. Normal vagina.  Amniotic fluid character: clear.  Pelvis: adequate for delivery.   Cervix: Cervix evaluated by digital exam.     Physical Exam  Constitutional: She is oriented to person, place, and time. She appears well-developed and well-nourished.  HENT:  Head: Normocephalic and atraumatic.  Neck: Normal range of motion. Neck supple.  Cardiovascular: Normal rate and regular rhythm.   Respiratory:  Effort normal and breath sounds normal.  GI:  Term FH, FHR 148  Genitourinary:  5-6/C/-1, AROM>>>clear AF  Musculoskeletal: Normal range of motion.  Neurological: She is alert and oriented to person, place, and time.    Prenatal labs: ABO, Rh: --/--/B POS (02/02 0630) Antibody: PENDING (02/02 0630) Rubella:   RPR:    HBsAg:    HIV:    GBS: Positive (01/19 0000)   Assessment/Plan: Term IUP/labor/+ GBS, for ABX   Bear Osten M 06/03/2014, 7:53 AM

## 2014-06-03 NOTE — MAU Note (Signed)
Contracting since 3am. NO lof, no bleeding. Positive fetal movement. No vag bleeding. GBS positive per patient .

## 2014-06-03 NOTE — Anesthesia Postprocedure Evaluation (Signed)
  Anesthesia Post-op Note Anesthesia Post Note  Patient: Kelly Rush  Procedure(s) Performed: * No procedures listed *  Anesthesia type: Epidural  Patient location: Mother/Baby  Post pain: Pain level controlled  Post assessment: Post-op Vital signs reviewed  Last Vitals:  Filed Vitals:   06/03/14 1414  BP: 120/61  Pulse: 74  Temp: 37.4 C  Resp: 18    Post vital signs: Reviewed  Level of consciousness:alert  Complications: No apparent anesthesia complications

## 2014-06-03 NOTE — Lactation Note (Signed)
This note was copied from the chart of Kelly Lillie ColumbiaMichelle Yeagle. Lactation Consultation Note  Mother requested assistance w/ latching. Morrie SheldonAshley RN in room and helped mother hand express good drops of colostrum. Assisted in latching baby in football hold.  A few sucks and swallows observed. Repositioned baby to laid back position cross cradle.  Baby latched, sucks and swallows observed. Encouraged mother to massage breast to keep baby active. Reviewed cluster feeding, feeding with cues and Mom encouraged to feed baby 8-12 times/24 hours and with feeding cues.  Mom made aware of O/P services, breastfeeding support groups, community resources, and our phone # for post-discharge questions.    Patient Name: Kelly Rush ZOXWR'UToday's Date: 06/03/2014 Reason for consult: Initial assessment   Maternal Data Has patient been taught Hand Expression?: Yes Does the patient have breastfeeding experience prior to this delivery?: No (did not bf older son)  Feeding Feeding Type: Breast Fed (Simultaneous filing. User may not have seen previous data.) Length of feed: 15 min  LATCH Score/Interventions Latch: Grasps breast easily, tongue down, lips flanged, rhythmical sucking.  Audible Swallowing: A few with stimulation  Type of Nipple: Everted at rest and after stimulation  Comfort (Breast/Nipple): Soft / non-tender     Hold (Positioning): Assistance needed to correctly position infant at breast and maintain latch.  LATCH Score: 8  Lactation Tools Discussed/Used     Consult Status Consult Status: Follow-up Date: 06/04/14 Follow-up type: In-patient    Dahlia ByesBerkelhammer, Ruth [facility] 06/03/2014, 2:49 PM

## 2014-06-04 ENCOUNTER — Inpatient Hospital Stay (HOSPITAL_COMMUNITY): Admission: RE | Admit: 2014-06-04 | Payer: BLUE CROSS/BLUE SHIELD | Source: Ambulatory Visit

## 2014-06-04 DIAGNOSIS — Z3480 Encounter for supervision of other normal pregnancy, unspecified trimester: Secondary | ICD-10-CM

## 2014-06-04 LAB — CBC
HCT: 30.2 % — ABNORMAL LOW (ref 36.0–46.0)
Hemoglobin: 10.1 g/dL — ABNORMAL LOW (ref 12.0–15.0)
MCH: 28.2 pg (ref 26.0–34.0)
MCHC: 33.4 g/dL (ref 30.0–36.0)
MCV: 84.4 fL (ref 78.0–100.0)
PLATELETS: 168 10*3/uL (ref 150–400)
RBC: 3.58 MIL/uL — ABNORMAL LOW (ref 3.87–5.11)
RDW: 14 % (ref 11.5–15.5)
WBC: 9.4 10*3/uL (ref 4.0–10.5)

## 2014-06-04 LAB — RPR: RPR Ser Ql: NONREACTIVE

## 2014-06-04 NOTE — Progress Notes (Signed)
Post Partum Day 1 Subjective: no complaints, up ad lib, voiding and tolerating PO  Objective: Blood pressure 109/60, pulse 67, temperature 98.2 F (36.8 C), temperature source Oral, resp. rate 20, height 5\' 2"  (1.575 m), weight 172 lb 6.4 oz (78.2 kg), SpO2 100 %, unknown if currently breastfeeding.  Physical Exam:  General: alert and cooperative Lochia: appropriate Uterine Fundus: firm Incision: perineum intact DVT Evaluation: No evidence of DVT seen on physical exam. Negative Homan's sign. No cords or calf tenderness. No significant calf/ankle edema.   Recent Labs  06/03/14 0630 06/04/14 0600  HGB 10.6* 10.1*  HCT 32.2* 30.2*    Assessment/Plan: Plan for discharge tomorrow   LOS: 1 day   Kelly Rush G 06/04/2014, 7:53 AM

## 2014-06-05 MED ORDER — PRENATAL MULTIVITAMIN CH: 1.0000 | ORAL_TABLET | Freq: Every day | ORAL | Status: DC

## 2014-06-05 MED ORDER — IBUPROFEN 800 MG PO TABS: 800.0000 mg | ORAL_TABLET | Freq: Three times a day (TID) | ORAL | Status: DC | PRN

## 2014-06-05 MED ORDER — OXYCODONE-ACETAMINOPHEN 5-325 MG PO TABS: 1.0000 | ORAL_TABLET | ORAL | Status: DC | PRN

## 2014-06-05 NOTE — Discharge Summary (Signed)
Obstetric Discharge Summary Reason for Admission: onset of labor Prenatal Procedures: ultrasound Intrapartum Procedures: spontaneous vaginal delivery Postpartum Procedures: none Complications-Operative and Postpartum: none HEMOGLOBIN  Date Value Ref Range Status  06/04/2014 10.1* 12.0 - 15.0 g/dL Final  16/10/960405/05/2011 54.013.2 12.2 - 16.2 g/dL Final   HCT  Date Value Ref Range Status  06/04/2014 30.2* 36.0 - 46.0 % Final   HCT, POC  Date Value Ref Range Status  08/31/2011 39.6 37.7 - 47.9 % Final    Physical Exam:  General: alert and cooperative Lochia: appropriate Uterine Fundus: firm Incision: perineum intact DVT Evaluation: No evidence of DVT seen on physical exam. Negative Homan's sign. No cords or calf tenderness. No significant calf/ankle edema.  Discharge Diagnoses: Term Pregnancy-delivered  Discharge Information: Date: 06/05/2014 Activity: pelvic rest Diet: routine Medications: PNV, Ibuprofen and Percocet Condition: stable Instructions: refer to practice specific booklet Discharge to: home   Newborn Data: Live born female  Birth Weight: 7 lb 2.8 oz (3255 g) APGAR: 8, 9  Home with mother.  Miyako Oelke G 06/05/2014, 8:08 AM

## 2014-06-05 NOTE — Lactation Note (Addendum)
This note was copied from the chart of Kelly Rush. Lactation Consultation Note  Patient Name: Kelly Lillie ColumbiaMichelle Dobbin YSAYT'KToday's Date: 06/05/2014 Reason for consult: Follow-up assessment Baby 49 hours of life. Baby sleeping STS on mom's chest. Mom reports baby nursing well, and she is hearing the baby swallow. Mom states that she is have some transient nipple discomfort at beginning of BF, but it passes after 15-20 seconds. Explained the cause and assured this should be temporary. Discussed that persistent nipple pain throughout BF was not normal and [facility]. Mom states that her breasts are starting to fill. Enc mom to continue to nurse with cues. Discussed engorgement prevention/treatment. Mom aware of OP/BFSG and LC phone line assistance after D/C. Discussed nursing/pumping and returning to work. Discussed ways that FOB can assist mom with BF and bond with baby. Referred parents to Baby and Me booklet for EBM storage guidelines and number of diapers to expect by day of life.   Maternal Data    Feeding Feeding Type: Breast Fed Length of feed: 28 min  LATCH Score/Interventions                      Lactation Tools Discussed/Used     Consult Status Consult Status: Complete    Nancy NordmannWILLIARD, Matsuko Kretz 06/05/2014, 12:47 PM

## 2014-06-08 ENCOUNTER — Emergency Department (HOSPITAL_COMMUNITY)
Admission: EM | Admit: 2014-06-08 | Discharge: 2014-06-08 | Disposition: A | Discharge status: Home / Self Care | Payer: BLUE CROSS/BLUE SHIELD | Attending: Emergency Medicine | Admitting: Emergency Medicine

## 2014-06-08 ENCOUNTER — Encounter (HOSPITAL_COMMUNITY): Payer: Self-pay | Admitting: Emergency Medicine

## 2014-06-08 DIAGNOSIS — R112 Nausea with vomiting, unspecified: Secondary | ICD-10-CM | POA: Diagnosis present

## 2014-06-08 DIAGNOSIS — A059 Bacterial foodborne intoxication, unspecified: Secondary | ICD-10-CM | POA: Diagnosis not present

## 2014-06-08 DIAGNOSIS — Z862 Personal history of diseases of the blood and blood-forming organs and certain disorders involving the immune mechanism: Secondary | ICD-10-CM | POA: Diagnosis not present

## 2014-06-08 DIAGNOSIS — Z79899 Other long term (current) drug therapy: Secondary | ICD-10-CM | POA: Insufficient documentation

## 2014-06-08 DIAGNOSIS — Z8619 Personal history of other infectious and parasitic diseases: Secondary | ICD-10-CM | POA: Insufficient documentation

## 2014-06-08 MED ORDER — ONDANSETRON 8 MG PO TBDP
8.0000 mg | ORAL_TABLET | Freq: Once | ORAL | Status: AC
  Administered 2014-06-08: 8 mg via ORAL
  Filled 2014-06-08: qty 1

## 2014-06-08 MED ORDER — ONDANSETRON 4 MG PO TBDP: ORAL_TABLET | ORAL | Status: DC

## 2014-06-08 NOTE — Discharge Instructions (Signed)
Food Poisoning °Food poisoning is an illness caused by something you ate or drank. There are over 250 known causes of food poisoning. However, many other causes are unknown. You can be treated even if the exact cause of your food poisoning is not known. In most cases, food poisoning is mild and lasts 1 to 2 days. However, some cases can be serious, especially for people with low immune systems, the elderly, children and infants, and pregnant women. °CAUSES  °Poor personal hygiene, improper cleaning of storage and preparation areas, and unclean utensils can cause infection or tainting (contamination) of foods. The causes of food poisoning are numerous. Infectious agents, such as viruses, bacteria, or parasites, can cause harm by infecting the intestine and disrupting the absorption of nutrients and water. This can cause diarrhea and lead to dehydration. Viruses are responsible for most of the food poisonings in which an agent is found. Parasites are less likely to cause food poisoning. Toxic agents, such as poisonous mushrooms, marine algae, and pesticides can also cause food poisoning. °· Viral causes of food poisoning include: °¨ Norovirus. °¨ Rotavirus. °¨ Hepatitis A. °· Bacterial causes of food poisoning include: °¨ Salmonellae. °¨ Campylobacter. °¨ Bacillus cereus. °¨ Escherichia coli (E. coli). °¨ Shigella. °¨ Listeria monocytogenes. °¨ Clostridium botulinum (botulism). °¨ Vibrio cholerae. °· Parasites that can cause food poisoning include: °¨ Giardia. °¨ Cryptosporidium. °¨ Toxoplasma. °SYMPTOMS °Symptoms may appear several hours or longer after consuming the contaminated food or drink. Symptoms may include: °· Nausea. °· Vomiting. °· Cramping. °· Diarrhea. °· Fever and chills. °· Muscle aches. °DIAGNOSIS °Your health care provider may be able to diagnose food poisoning from a list of what you have recently eaten and results from lab tests. Diagnostic tests may include an exam of the feces. °TREATMENT °In  most cases, treatment focuses on helping to relieve your symptoms and staying well hydrated. Antibiotic medicines are rarely needed. In severe cases, hospitalization may be required. °HOME CARE INSTRUCTIONS  °· Drink enough water and fluids to keep your urine clear or pale yellow. Drink small amounts of fluids frequently and increase as tolerated. °· Ask your health care provider for specific rehydration instructions. °· Avoid: °¨ Foods high in sugar. °¨ Alcohol. °¨ Carbonated drinks. °¨ Tobacco. °¨ Juice. °¨ Caffeine drinks. °¨ Extremely hot or cold fluids. °¨ Fatty, greasy foods. °¨ Too much intake of anything at one time. °¨ Dairy products until 24 to 48 hours after diarrhea stops. °· You may consume probiotics. Probiotics are active cultures of beneficial bacteria. They may lessen the amount and number of diarrheal stools in adults. Probiotics can be found in yogurt with active cultures and in supplements. °· Wash your hands well to avoid spreading the bacteria. °· Take medicines only as directed by your health care provider. Do not give your child aspirin because of the association with Reye's syndrome. °· Ask your health care provider if you should continue to take your regular prescribed and over-the-counter medicines. °PREVENTION  °· Wash your hands, food preparation surfaces, and utensils thoroughly before and after handling raw foods. °· Keep refrigerated foods below 40°F (5°C). °· Serve hot foods immediately or keep them heated above 140°F (60°C). °· Divide large volumes of food into small portions for rapid cooling in the refrigerator. Hot, bulky foods in the refrigerator can raise the temperature of other foods that have already cooled. °· Follow approved canning procedures. °· Heat canned foods thoroughly before tasting. °· When in doubt, throw it out. °· Infants, the elderly, women   who are pregnant, and people with compromised immune systems are especially susceptible to food poisoning. These people  should never consume unpasteurized cheese, unpasteurized cider, raw fish, raw seafood, or raw meat-type products. °SEEK IMMEDIATE MEDICAL CARE IF:  °· You have difficulty breathing, swallowing, talking, or moving. °· You develop blurred vision. °· You are unable to keep fluids down. °· You faint or nearly faint. °· Your eyes turn yellow. °· Vomiting or diarrhea develops or becomes persistent. °· Abdominal pain develops, increases, or localizes in one small area. °· You have a fever. °· The diarrhea becomes excessive or contains blood or mucus. °· You develop excessive weakness, dizziness, or extreme thirst. °· You have no urine for 8 hours. °MAKE SURE YOU:  °· Understand these instructions. °· Will watch your condition. °· Will get help right away if you are not doing well or get worse. °Document Released: 01/15/2004 Document Revised: 09/02/2013 Document Reviewed: 09/02/2010 °Exit[employer]® Patient Information ©2015 [employer]. This information is not intended to replace advice given to you by your health care provider. Make sure you discuss any questions you have with your health care provider. ° °

## 2014-06-08 NOTE — ED Provider Notes (Signed)
CSN: [id number]     Arrival date & time 06/08/14  1820 History  This chart was scribed for Rolan Bucco, MD by Milly Jakob, ED Scribe. The patient was seen in room WA25/WA25. Patient's care was started at 7:00 PM.     Chief Complaint  Patient presents with  . Nausea  . Emesis   The history is provided by the patient. No language interpreter was used.   HPI Comments: Larita Deremer is a 29 y.o. female who gave birth, NSVD,  5 days ago and presents to the Emergency Department complaining of intermittent, severe, nausea and vomiting for the past 4 hours. She denies diarrhea, abdominal pain, dizziness and weakness. She reports that her 22 year old son and fiance have been sick with the same symptoms, and that this started after they all ate left overs from dinner the night before. The symptoms started about 30 minutes after eating the food.  She ate macaroni cheese and some rice. She denies any complications with her recent birth, and states that she has begun breastfeeding. She denies any abdominal pain. She states her vaginal bleeding has lessened. She denies any urinary symptoms.  Past Medical History  Diagnosis Date  . Anemia   . HSV (herpes simplex virus) infection    History reviewed. No pertinent past surgical history. No family history on file. History  Substance Use Topics  . Smoking status: Never Smoker   . Smokeless tobacco: Never Used  . Alcohol Use: No   OB History    Gravida Para Term Preterm AB TAB SAB Ectopic Multiple Living   0 1     Review of Systems  Constitutional: Negative for fever, chills, diaphoresis and fatigue.  HENT: Negative for congestion, rhinorrhea and sneezing.   Eyes: Negative.   Respiratory: Negative for cough, chest tightness and shortness of breath.   Cardiovascular: Negative for chest pain and leg swelling.  Gastrointestinal: Positive for nausea and vomiting. Negative for abdominal pain, diarrhea and blood in stool.  Genitourinary:  Negative for frequency, hematuria, flank pain and difficulty urinating.  Musculoskeletal: Negative for back pain and arthralgias.  Skin: Negative for rash.  Neurological: Negative for dizziness, speech difficulty, weakness, numbness and headaches.      Allergies  Review of patient's allergies indicates no known allergies.  Home Medications   Prior to Admission medications   Medication Sig Start Date End Date Taking? Authorizing Provider  ibuprofen (ADVIL,MOTRIN) 800 MG tablet Take 1 tablet (800 mg total) by mouth every 8 (eight) hours as needed for moderate pain. 06/05/14  Yes Judith Blonder, NP  oxyCODONE-acetaminophen (PERCOCET/ROXICET) 5-325 MG per tablet Take 1 tablet by mouth every 4 (four) hours as needed (for pain scale less than 7). 06/05/14  Yes Judith Blonder, NP  Prenatal Vit-Fe Fumarate-FA (PRENATAL MULTIVITAMIN) TABS tablet Take 1 tablet by mouth daily at 12 noon. 06/05/14  Yes Judith Blonder, NP  ondansetron (ZOFRAN ODT) 4 MG disintegrating tablet  ODT q4 hours prn nausea/vomit 06/08/14   Rolan Bucco, MD   Triage Vitals: BP 129/86 mmHg  Pulse 69  Temp(Src) 98.4 F (36.9 C) (Oral)  Resp 20  SpO2 99% Physical Exam  Constitutional: She is oriented to person, place, and time. She appears well-developed and well-nourished.  HENT:  Head: Normocephalic and atraumatic.  Eyes: Pupils are equal, round, and reactive to light.  Neck: Normal range of motion. Neck supple.  Cardiovascular: Normal rate, regular rhythm and normal  heart sounds.   Pulmonary/Chest: Effort normal and breath sounds normal. No respiratory distress. She has no wheezes. She has no rales. She exhibits no tenderness.  Abdominal: Soft. Bowel sounds are normal. There is no tenderness. There is no rebound and no guarding.  Musculoskeletal: Normal range of motion. She exhibits no edema.  Lymphadenopathy:    She has no cervical adenopathy.  Neurological: She is alert and oriented to person, place, and time.  Skin:  Skin is warm and dry. No rash noted.  Psychiatric: She has a normal mood and affect.    ED Course  Procedures (including critical care time) Labs Review Labs Reviewed - No data to display  Imaging Review No results found.   EKG Interpretation None      MDM   Final diagnoses:  Food poisoning    Patient presents with nausea and vomiting. Her vomiting started just after eating some leftover food. She had the same symptoms as to other family members. I suspect food poisoning. She's feeling much better after 1 dose of Zofran. She is tolerating by mouth fluids. She has no abdominal tenderness on exam. She was discharged home in good condition and given return precautions.  I personally performed the services described in this documentation, which was scribed in my presence.  The recorded information has been reviewed and considered.      Rolan BuccoMelanie Nyx Keady, MD 06/08/14 2123

## 2014-06-08 NOTE — ED Notes (Signed)
Pt here with complaints of nausea vomiting that started at 4pm today. Denies fever and diarrhea.

## 2014-06-08 NOTE — ED Notes (Signed)
Awake. Verbally responsive. A/O x4. Resp even and unlabored. No audible adventitious breath sounds noted. ABC's intact.  

## 2014-06-08 NOTE — ED Notes (Signed)
Awake. Verbally responsive. Resp even and unlabored. No audible adventitious breath sounds noted. ABC's intact. Abd soft/nondistended but tender to palpate. BS (+) and active x4 quadrants. No N/V/D reported. 

## 2014-06-08 NOTE — ED Notes (Signed)
Pt given Gingerale to drink and tolerating well.

## 2015-05-22 ENCOUNTER — Encounter: Payer: Self-pay | Admitting: Family Medicine

## 2015-05-27 ENCOUNTER — Encounter: Payer: Self-pay | Admitting: Family Medicine

## 2016-05-15 ENCOUNTER — Emergency Department (HOSPITAL_COMMUNITY): Payer: Self-pay

## 2016-05-15 ENCOUNTER — Emergency Department (HOSPITAL_COMMUNITY)
Admission: EM | Admit: 2016-05-15 | Discharge: 2016-05-15 | Disposition: A | Discharge status: Home / Self Care | Payer: Self-pay | Attending: Emergency Medicine | Admitting: Emergency Medicine

## 2016-05-15 ENCOUNTER — Encounter: Payer: Self-pay | Admitting: Emergency Medicine

## 2016-05-15 DIAGNOSIS — Z3491 Encounter for supervision of normal pregnancy, unspecified, first trimester: Secondary | ICD-10-CM

## 2016-05-15 DIAGNOSIS — O26891 Other specified pregnancy related conditions, first trimester: Secondary | ICD-10-CM | POA: Insufficient documentation

## 2016-05-15 DIAGNOSIS — R1031 Right lower quadrant pain: Secondary | ICD-10-CM | POA: Insufficient documentation

## 2016-05-15 DIAGNOSIS — Z3A08 8 weeks gestation of pregnancy: Secondary | ICD-10-CM | POA: Insufficient documentation

## 2016-05-15 LAB — WET PREP, GENITAL
CLUE CELLS WET PREP: NONE SEEN
Sperm: NONE SEEN
Trich, Wet Prep: NONE SEEN
Yeast Wet Prep HPF POC: NONE SEEN

## 2016-05-15 LAB — COMPREHENSIVE METABOLIC PANEL
ALBUMIN: 4.1 g/dL (ref 3.5–5.0)
ALT: 14 U/L (ref 14–54)
AST: 15 U/L (ref 15–41)
Alkaline Phosphatase: 53 U/L (ref 38–126)
Anion gap: 7 (ref 5–15)
BUN: 11 mg/dL (ref 6–20)
CHLORIDE: 105 mmol/L (ref 101–111)
CO2: 23 mmol/L (ref 22–32)
CREATININE: 0.58 mg/dL (ref 0.44–1.00)
Calcium: 9.1 mg/dL (ref 8.9–10.3)
GFR calc Af Amer: >60 mL/min (ref >60)
Glucose, Bld: 98 mg/dL (ref 65–99)
Potassium: 4.1 mmol/L (ref 3.5–5.1)
Sodium: 135 mmol/L (ref 135–145)
Total Bilirubin: 0.7 mg/dL (ref 0.3–1.2)
Total Protein: 6.8 g/dL (ref 6.5–8.1)

## 2016-05-15 LAB — URINALYSIS, ROUTINE W REFLEX MICROSCOPIC
Bilirubin Urine: NEGATIVE
Glucose, UA: NEGATIVE mg/dL
Hgb urine dipstick: NEGATIVE
Ketones, ur: 5 mg/dL — AB
Leukocytes, UA: NEGATIVE
Nitrite: NEGATIVE
PH: 6 (ref 5.0–8.0)
Protein, ur: 30 mg/dL — AB
SPECIFIC GRAVITY, URINE: 1.024 (ref 1.005–1.030)

## 2016-05-15 LAB — CBC
HEMATOCRIT: 35.5 % — AB (ref 36.0–46.0)
Hemoglobin: 12.1 g/dL (ref 12.0–15.0)
MCH: 29.7 pg (ref 26.0–34.0)
MCHC: 34.1 g/dL (ref 30.0–36.0)
MCV: 87 fL (ref 78.0–100.0)
PLATELETS: 218 10*3/uL (ref 150–400)
RBC: 4.08 MIL/uL (ref 3.87–5.11)
RDW: 12.1 % (ref 11.5–15.5)
WBC: 5.1 10*3/uL (ref 4.0–10.5)

## 2016-05-15 LAB — I-STAT BETA HCG BLOOD, ED (MC, WL, AP ONLY): I-stat hCG, quantitative: >2000 m[IU]/mL — ABNORMAL HIGH (ref <5)

## 2016-05-15 LAB — LIPASE, BLOOD: LIPASE: 22 U/L (ref 11–51)

## 2016-05-15 MED ORDER — ONDANSETRON 4 MG PO TBDP
4.0000 mg | ORAL_TABLET | Freq: Once | ORAL | Status: AC
  Administered 2016-05-15: 4 mg via ORAL
  Filled 2016-05-15: qty 1

## 2016-05-15 MED ORDER — ACETAMINOPHEN 500 MG PO TABS
1000.0000 mg | ORAL_TABLET | Freq: Once | ORAL | Status: AC
  Administered 2016-05-15: 1000 mg via ORAL
  Filled 2016-05-15: qty 2

## 2016-05-15 NOTE — ED Triage Notes (Addendum)
Pt c/o diffuse generalized intermittent abdominal cramping pain about every 2 minutes, nausea onset 1 hour ago. No vaginal bleeding or spotting, no nausea or diarrhea.  No prenatal care, no ultrasounds with this pregnancy. History of 2 successful pregnancies without complications.

## 2016-05-15 NOTE — ED Provider Notes (Signed)
[facility]-EMERGENCY DEPT Provider Note   CSN: [id number] Arrival date & time: 05/15/16  0850     History   Chief Complaint Chief Complaint  Patient presents with  . Abdominal Pain    HPI Kelly Rush is a 31 y.o. female.  HPI 31 year old female who presents with low abdominal pain and cramping. She is G3 P2 at about [redacted] weeks gestational age by last LMP. She has not had OB follow-up yet for this pregnancy. States that she has been usual state of health. At around 7:53 AM this morning developed sharp abdominal cramping in her low abdomen associated with nausea and vomiting. Denies any abnormal vaginal bleeding or discharge. No dysuria, urinary frequency, hematuria, fevers or chills. No complications with prior pregnancies.   Past Medical History:  Diagnosis Date  . Anemia   . HSV (herpes simplex virus) infection     Patient Active Problem List   Diagnosis Date Noted  . Pregnancy 06/04/2014    History reviewed. No pertinent surgical history.  OB History    Gravida Para Term Preterm AB Living   3 2 2     1    SAB TAB Ectopic Multiple Live Births         0 1       Home Medications    Prior to Admission medications   Medication Sig Start Date End Date Taking? Authorizing Provider  ibuprofen (ADVIL,MOTRIN) 800 MG tablet Take 1 tablet (800 mg total) by mouth every 8 (eight) hours as needed for moderate pain. Patient not taking: Reported on 05/15/2016 06/05/14   Julio Sicks, NP  ondansetron (ZOFRAN ODT) 4 MG disintegrating tablet 4mg  ODT q4 hours prn nausea/vomit Patient not taking: Reported on 05/15/2016 06/08/14   Rolan Bucco, MD  oxyCODONE-acetaminophen (PERCOCET/ROXICET) 5-325 MG per tablet Take 1 tablet by mouth every 4 (four) hours as needed (for pain scale less than 7). 06/05/14   Julio Sicks, NP  Prenatal Vit-Fe Fumarate-FA (PRENATAL MULTIVITAMIN) TABS tablet Take 1 tablet by mouth daily at 12 noon. 06/05/14   Julio Sicks, NP    Family History History reviewed. No  pertinent family history.  Social History Social History  Substance Use Topics  . Smoking status: Never Smoker  . Smokeless tobacco: Never Used  . Alcohol use No     Allergies   Patient has no known allergies.   Review of Systems Review of Systems 10/14 systems reviewed and are negative other than those stated in the HPI   Physical Exam Updated Vital Signs BP 127/83   Pulse 71   Temp 98 F (36.7 C)   Resp 14   LMP 03/17/2016   SpO2 100%   Physical Exam Physical Exam  Nursing note and vitals reviewed. Constitutional: Non-toxic, and in no acute distress Head: Normocephalic and atraumatic.  Mouth/Throat: Oropharynx is clear and moist.  Neck: Normal range of motion. Neck supple.  Cardiovascular: Normal rate and regular rhythm.   Pulmonary/Chest: Effort normal and breath sounds normal.  Abdominal: Soft. There is low abdominal and pelvic tenderness. There is no rebound and no guarding.  Musculoskeletal: Normal range of motion.  Neurological: Alert, no facial droop, fluent speech, moves all extremities symmetrically Skin: Skin is warm and dry.  Psychiatric: Cooperative Pelvic: Normal external genitalia. Normal internal genitalia. No discharge. No blood within the vagina. No cervical motion tenderness. No adnexal masses. Some tenderness in right adnexa    ED Treatments / Results  Labs (all labs ordered are listed, but only abnormal results are displayed)  Labs Reviewed  WET PREP, GENITAL - Abnormal; Notable for the following:       Result Value   WBC, Wet Prep HPF POC MODERATE (*)    All other components within normal limits  CBC - Abnormal; Notable for the following:    HCT 35.5 (*)    All other components within normal limits  URINALYSIS, ROUTINE W REFLEX MICROSCOPIC - Abnormal; Notable for the following:    Ketones, ur 5 (*)    Protein, ur 30 (*)    Bacteria, UA RARE (*)    Squamous Epithelial / LPF 0-5 (*)    All other components within normal limits    I-STAT BETA HCG BLOOD, ED ([facility], [facility], [facility] ONLY) - Abnormal; Notable for the following:    I-stat hCG, quantitative >2,000.0 (*)    All other components within normal limits  LIPASE, BLOOD  COMPREHENSIVE METABOLIC PANEL  HCG, QUANTITATIVE, PREGNANCY  GC/CHLAMYDIA PROBE AMP ([place]) NOT AT [facility]    EKG  EKG Interpretation None       Radiology [place] Ob Comp Less 14 Wks  Result Date: 05/15/2016 CLINICAL DATA:  Lower abdominal pain LEFT greater than RIGHT, [redacted] weeks pregnant by LMP EXAM: OBSTETRIC <14 WK US AND TRANSVAGINAL OB US TECHNIQUE: Both transabdominal and transvaginal ultrasound examinations were performed for complete evaluation of the gestation as well as the maternal uterus, adnexal regions, and pelvic cul-de-sac. Transvaginal technique was performed to assess early pregnancy. COMPARISON:  None FINDINGS: Intrauterine gestational sac: Present Yolk sac:  Present Embryo:  Present Cardiac Activity: Present Heart Rate: 160  bpm CRL:  19.5  mm   8 w   4 d                  US EDC: 12/21/2016 Subchorionic hemorrhage: No subchronic hemorrhage though a very small amount of hypoechoic fluid is seen within the endometrial canal the lower uterine segment. Maternal uterus/adnexae: RIGHT ovary 4.1 x 2.2 x 2.2 cm containing a corpus luteum 3.0 x 1.8 x 1.9 cm. LEFT ovary normal size and morphology, 3.0 x 1.3 x 1.1 cm. Trace free pelvic fluid. No adnexal masses. IMPRESSION: Single live intrauterine gestation measured at 8 weeks 4 days EGA by LMP. No definite acute abnormalities, though a very small amount of fluid is seen within the endometrium canal at the lower uterine segment. Electronically Signed   By: Ulyses SouthwardMark  Boles M.D.   On: 05/15/2016 13:10   [place] Ob Transvaginal  Result Date: 05/15/2016 CLINICAL DATA:  Lower abdominal pain LEFT greater than RIGHT, [redacted] weeks pregnant by LMP EXAM: OBSTETRIC <14 WK US AND TRANSVAGINAL OB US TECHNIQUE: Both transabdominal and transvaginal ultrasound examinations were  performed for complete evaluation of the gestation as well as the maternal uterus, adnexal regions, and pelvic cul-de-sac. Transvaginal technique was performed to assess early pregnancy. COMPARISON:  None FINDINGS: Intrauterine gestational sac: Present Yolk sac:  Present Embryo:  Present Cardiac Activity: Present Heart Rate: 160  bpm CRL:  19.5  mm   8 w   4 d                  US EDC: 12/21/2016 Subchorionic hemorrhage: No subchronic hemorrhage though a very small amount of hypoechoic fluid is seen within the endometrial canal the lower uterine segment. Maternal uterus/adnexae: RIGHT ovary 4.1 x 2.2 x 2.2 cm containing a corpus luteum 3.0 x 1.8 x 1.9 cm. LEFT ovary normal size and morphology, 3.0 x 1.3 x 1.1 cm. Trace free pelvic  fluid. No adnexal masses. IMPRESSION: Single live intrauterine gestation measured at 8 weeks 4 days EGA by LMP. No definite acute abnormalities, though a very small amount of fluid is seen within the endometrium canal at the lower uterine segment. Electronically Signed   By: Ulyses Southward M.D.   On: 05/15/2016 13:10   US Abdomen Limited  Result Date: 05/15/2016 CLINICAL DATA:  Acute right lower quadrant abdominal pain, first trimester pregnancy. EXAM: LIMITED ABDOMINAL ULTRASOUND TECHNIQUE: Wallace Cullens scale imaging of the right lower quadrant was performed to evaluate for suspected appendicitis. Standard imaging planes and graded compression technique were utilized. COMPARISON:  None. FINDINGS: The appendix is not visualized. Ancillary findings: None. Factors affecting image quality: None. IMPRESSION: 1. Nonvisualization of the appendix. If the clinical scenario warrants, noncontrast MRI to assess the appendix is a further workup option and is considered safe in the first trimester of pregnancy (JAMA, January 06, 2015, Vol. 316:9). Note: Non-visualization of appendix by [place] does not definitely exclude appendicitis. If there is sufficient clinical concern, consider abdomen pelvis CT with  contrast for further evaluation. Electronically Signed   By: Gaylyn Rong M.D.   On: 05/15/2016 15:52    Procedures Procedures (including critical care time)  Medications Ordered in ED Medications  acetaminophen (TYLENOL) tablet 1,000 mg (1,000 mg Oral Given 05/15/16 1315)  ondansetron (ZOFRAN-ODT) disintegrating tablet 4 mg (4 mg Oral Given 05/15/16 1315)     Initial Impression / Assessment and Plan / ED Course  I have reviewed the triage vital signs and the nursing notes.  Pertinent labs & imaging results that were available during my care of the patient were reviewed by me and considered in my medical decision making (see chart for details).  Clinical Course     Presenting with low abdominal pain in setting of early 1st trimester pregnancy. Abdomen soft, non-peritoneal, but with RLQ abdominal tenderness. Blood work reviewed, and reassuring CBC, CMP, lipase.   Will perform US ob for evaluation of ectopic pregnancy or other acute complication  Ultrasound visualized. Evidence of IUP at [redacted] weeks gestational age. No ovarian pathology noted. UA without evidence of infection to suggest infection. With ongoing right lower quadrant abdominal pain. Suspicion for appendicitis is low is no leukocytosis or fever, but cannot fully rule out. Did initially discuss with Dr. Maisie Fus from general surgery who declined examining or seeing patient in ED but recommending [place] vs MRI vs admission to medicine service for serial abdominal exams vs OB consult.  Did initially perform [place] limited abdomen for r/o appendictis. Appendix could not be visualized. Given no MRI at [facility] today, will transfer to [facility] for MRI abd/pelvis in ED. Discussed with Dr. Deretha [facility], who accepted transfer. Patient requested to go by private vehicle.   Final Clinical Impressions(s) / ED Diagnoses   Final diagnoses:  RLQ abdominal pain  RLQ abdominal pain    New Prescriptions New Prescriptions   No medications on file       Lavera Guise, MD 05/15/16 1645

## 2016-05-15 NOTE — Discharge Instructions (Addendum)
[facility]

## 2016-05-15 NOTE — ED Provider Notes (Signed)
[facility]-EMERGENCY DEPT Provider Note  CSN: [id number] Arrival date & time: 05/15/16  0850  History   Chief Complaint Chief Complaint  Patient presents with  . Abdominal Pain   HPI Kelly Rush is a 31 y.o. female.  The history is provided by the patient and medical records. No language interpreter was used.  Illness  This is a new problem. The current episode started 12 to 24 hours ago. The problem occurs constantly. The problem has been gradually improving. Associated symptoms include abdominal pain. Pertinent negatives include no chest pain, no headaches and no shortness of breath. Exacerbated by: Movement, palpation. Relieved by: Tylenol.    Past Medical History:  Diagnosis Date  . Anemia   . HSV (herpes simplex virus) infection    Patient Active Problem List   Diagnosis Date Noted  . Pregnancy 06/04/2014   History reviewed. No pertinent surgical history.  OB History    Gravida Para Term Preterm AB Living   3 2 2     1    SAB TAB Ectopic Multiple Live Births         0 1      Home Medications    Prior to Admission medications   Medication Sig Start Date End Date Taking? Authorizing Provider  ibuprofen (ADVIL,MOTRIN) 800 MG tablet Take 1 tablet (800 mg total) by mouth every 8 (eight) hours as needed for moderate pain. Patient not taking: Reported on 05/15/2016 06/05/14   Julio Sicksarol Curtis, NP  ondansetron (ZOFRAN ODT) 4 MG disintegrating tablet 4mg  ODT q4 hours prn nausea/vomit Patient not taking: Reported on 05/15/2016 06/08/14   Rolan BuccoMelanie Belfi, MD  oxyCODONE-acetaminophen (PERCOCET/ROXICET) 5-325 MG per tablet Take 1 tablet by mouth every 4 (four) hours as needed (for pain scale less than 7). 06/05/14   Julio Sicksarol Curtis, NP  Prenatal Vit-Fe Fumarate-FA (PRENATAL MULTIVITAMIN) TABS tablet Take 1 tablet by mouth daily at 12 noon. 06/05/14   Julio Sicksarol Curtis, NP   Family History History reviewed. No pertinent family history.  Social History Social History  Substance Use Topics  . Smoking  status: Never Smoker  . Smokeless tobacco: Never Used  . Alcohol use No    Allergies   Patient has no known allergies.  Review of Systems Review of Systems  Constitutional: Positive for appetite change (decreased). Negative for chills and fever.  Respiratory: Negative for shortness of breath.   Cardiovascular: Negative for chest pain.  Gastrointestinal: Positive for abdominal pain, nausea and vomiting.  Neurological: Negative for headaches.  All other systems reviewed and are negative.   Physical Exam Updated Vital Signs BP 116/65   Pulse 77   Temp 99.6 F (37.6 C)   Resp 16   LMP 03/17/2016   SpO2 100%   Physical Exam  Constitutional: She is oriented to person, place, and time. No distress.  Young female  HENT:  Head: Normocephalic and atraumatic.  Eyes: EOM are normal. Pupils are equal, round, and reactive to light.  Neck: Normal range of motion. Neck supple.  Cardiovascular: Normal rate, regular rhythm and normal heart sounds.   Pulmonary/Chest: Effort normal and breath sounds normal.  Abdominal: Soft. Bowel sounds are normal. She exhibits no distension. There is tenderness (suprapubic & RLQ). There is no rebound and no guarding.  Musculoskeletal: Normal range of motion.  Neurological: She is alert and oriented to person, place, and time.  Skin: Skin is warm and dry. Capillary refill takes less than 2 seconds. She is not diaphoretic.  Nursing note and vitals reviewed.  ED Treatments / Results  Labs (all labs ordered are listed, but only abnormal results are displayed) Labs Reviewed  WET PREP, GENITAL - Abnormal; Notable for the following:       Result Value   WBC, Wet Prep HPF POC MODERATE (*)    All other components within normal limits  CBC - Abnormal; Notable for the following:    HCT 35.5 (*)    All other components within normal limits  URINALYSIS, ROUTINE W REFLEX MICROSCOPIC - Abnormal; Notable for the following:    Ketones, ur 5 (*)    Protein, ur  30 (*)    Bacteria, UA RARE (*)    Squamous Epithelial / LPF 0-5 (*)    All other components within normal limits  I-STAT BETA HCG BLOOD, ED ([facility], [facility], [facility] ONLY) - Abnormal; Notable for the following:    I-stat hCG, quantitative >2,000.0 (*)    All other components within normal limits  LIPASE, BLOOD  COMPREHENSIVE METABOLIC PANEL  HCG, QUANTITATIVE, PREGNANCY  GC/CHLAMYDIA PROBE AMP ([place]) NOT AT [facility]   EKG  EKG Interpretation None      Radiology US Ob Comp Less 14 Wks  Result Date: 05/15/2016 CLINICAL DATA:  Lower abdominal pain LEFT greater than RIGHT, [redacted] weeks pregnant by LMP EXAM: OBSTETRIC <14 WK [place] AND TRANSVAGINAL OB US TECHNIQUE: Both transabdominal and transvaginal ultrasound examinations were performed for complete evaluation of the gestation as well as the maternal uterus, adnexal regions, and pelvic cul-de-sac. Transvaginal technique was performed to assess early pregnancy. COMPARISON:  None FINDINGS: Intrauterine gestational sac: Present Yolk sac:  Present Embryo:  Present Cardiac Activity: Present Heart Rate: 160  bpm CRL:  19.5  mm   8 w   4 d                  [place] EDC: 12/21/2016 Subchorionic hemorrhage: No subchronic hemorrhage though a very small amount of hypoechoic fluid is seen within the endometrial canal the lower uterine segment. Maternal uterus/adnexae: RIGHT ovary 4.1 x 2.2 x 2.2 cm containing a corpus luteum 3.0 x 1.8 x 1.9 cm. LEFT ovary normal size and morphology, 3.0 x 1.3 x 1.1 cm. Trace free pelvic fluid. No adnexal masses. IMPRESSION: Single live intrauterine gestation measured at 8 weeks 4 days EGA by LMP. No definite acute abnormalities, though a very small amount of fluid is seen within the endometrium canal at the lower uterine segment. Electronically Signed   By: Ulyses Southward M.D.   On: 05/15/2016 13:10   US Ob Transvaginal  Result Date: 05/15/2016 CLINICAL DATA:  Lower abdominal pain LEFT greater than RIGHT, [redacted] weeks pregnant by LMP EXAM: OBSTETRIC <14  WK [place] AND TRANSVAGINAL OB US TECHNIQUE: Both transabdominal and transvaginal ultrasound examinations were performed for complete evaluation of the gestation as well as the maternal uterus, adnexal regions, and pelvic cul-de-sac. Transvaginal technique was performed to assess early pregnancy. COMPARISON:  None FINDINGS: Intrauterine gestational sac: Present Yolk sac:  Present Embryo:  Present Cardiac Activity: Present Heart Rate: 160  bpm CRL:  19.5  mm   8 w   4 d                  [place] EDC: 12/21/2016 Subchorionic hemorrhage: No subchronic hemorrhage though a very small amount of hypoechoic fluid is seen within the endometrial canal the lower uterine segment. Maternal uterus/adnexae: RIGHT ovary 4.1 x 2.2 x 2.2 cm containing a corpus luteum 3.0 x 1.8 x 1.9 cm. LEFT  ovary normal size and morphology, 3.0 x 1.3 x 1.1 cm. Trace free pelvic fluid. No adnexal masses. IMPRESSION: Single live intrauterine gestation measured at 8 weeks 4 days EGA by LMP. No definite acute abnormalities, though a very small amount of fluid is seen within the endometrium canal at the lower uterine segment. Electronically Signed   By: Ulyses Southward M.D.   On: 05/15/2016 13:10   US Abdomen Limited  Result Date: 05/15/2016 CLINICAL DATA:  Acute right lower quadrant abdominal pain, first trimester pregnancy. EXAM: LIMITED ABDOMINAL ULTRASOUND TECHNIQUE: Wallace Cullens scale imaging of the right lower quadrant was performed to evaluate for suspected appendicitis. Standard imaging planes and graded compression technique were utilized. COMPARISON:  None. FINDINGS: The appendix is not visualized. Ancillary findings: None. Factors affecting image quality: None. IMPRESSION: 1. Nonvisualization of the appendix. If the clinical scenario warrants, noncontrast MRI to assess the appendix is a further workup option and is considered safe in the first trimester of pregnancy (JAMA, January 06, 2015, Vol. 316:9). Note: Non-visualization of appendix by [place] does not  definitely exclude appendicitis. If there is sufficient clinical concern, consider abdomen pelvis CT with contrast for further evaluation. Electronically Signed   By: Gaylyn Rong M.D.   On: 05/15/2016 15:52   Procedures Procedures (including critical care time)  Medications Ordered in ED Medications  acetaminophen (TYLENOL) tablet 1,000 mg (1,000 mg Oral Given 05/15/16 1315)  ondansetron (ZOFRAN-ODT) disintegrating tablet 4 mg (4 mg Oral Given 05/15/16 1315)    Initial Impression / Assessment and Plan / ED Course  I have reviewed the triage vital signs and the nursing notes.  Pertinent labs & imaging results that were available during my care of the patient were reviewed by me and considered in my medical decision making (see chart for details).  Clinical Course   31 y.o. female with above stated PMHx, HPI, and physical. Abdominal cramping worse in RLQ. Denies fever or chills. Associated with normal N/V she has been experiencing throughout pregnancy. Pain initially 10/10. Given Tylenol then 6/10. Seen at OSH with concern for ectopic vs torsion vs appendicitis. Transvaginal US showing IUP at 5wks without ectopic or torsion. Pt transferred to [facility] for MRI eval of appendicitis.  CBC without leukocytosis or acute anemia. CMP and lipase unremarkable. UA with rare bacteria but negative for leukocytes and negative nitrate, repeat urine here without evidence of infection - patient without urinary symptoms, doubt UTI. Wet prep with moderate leukocytes but negative for trichomonas or yeast or clue cells. Gonorrhea/chlamydia swab collected. MRI showing no evidence of appendicitis but noting large stool burden consistent with constipation.  Laboratory and imaging results were personally reviewed by myself and used in the medical decision making of this patient's treatment and disposition.  Right lower quadrant pain possibly from round ligament pain versus constipation versus gastroenteritis. Patient  will call her OB in the morning to set up a follow-up appointment for this week. Pt discharged home in stable condition. Strict ED return precautions dicussed. Pt understands and agrees with the plan and has no further questions or concerns.   Pt care discussed with and followed by my attending, Dr. Hollice Gong, MD Pager [phone]  Final Clinical Impressions(s) / ED Diagnoses   Final diagnoses:  RLQ abdominal pain    New Prescriptions New Prescriptions   No medications on file     Angelina Ok, MD 05/16/16 0150    Margarita Grizzle, MD 05/19/16 1309

## 2016-05-15 NOTE — ED Triage Notes (Signed)
Pt here, called MRI to alert them that patient is ready for MRI.

## 2016-05-15 NOTE — ED Notes (Signed)
Pt attempted to void while on the way back to the room.  Unsuccessful.  She has a urine cup and is aware that urine sample is needed to test for UTI.

## 2016-05-15 NOTE — ED Notes (Signed)
[facility] to [facility].  Pt await her husband coming back [facility]  Paperwork given to pt

## 2016-05-15 NOTE — ED Notes (Signed)
[facility], took her out to the car in a wheelchair

## 2016-05-15 NOTE — ED Notes (Signed)
Pt cannot urinate. 

## 2016-05-15 NOTE — ED Notes (Signed)
Patient transported to MRI 

## 2016-05-15 NOTE — ED Notes (Signed)
[facility]ed [facility] charge RN Asher MuirJamie to notify that pt will be transferred via private vehicle to have MRI done

## 2016-05-16 LAB — GC/CHLAMYDIA PROBE AMP (~~LOC~~) NOT AT ARMC
Chlamydia: NEGATIVE
Neisseria Gonorrhea: NEGATIVE

## 2018-04-15 ENCOUNTER — Encounter (HOSPITAL_COMMUNITY): Payer: Self-pay | Admitting: Emergency Medicine

## 2018-04-15 ENCOUNTER — Other Ambulatory Visit: Payer: Self-pay

## 2018-04-15 ENCOUNTER — Emergency Department (HOSPITAL_COMMUNITY)
Admission: EM | Admit: 2018-04-15 | Discharge: 2018-04-15 | Disposition: A | Discharge status: Home / Self Care | Payer: BLUE CROSS/BLUE SHIELD | Attending: Emergency Medicine | Admitting: Emergency Medicine

## 2018-04-15 DIAGNOSIS — J019 Acute sinusitis, unspecified: Secondary | ICD-10-CM | POA: Insufficient documentation

## 2018-04-15 DIAGNOSIS — J0100 Acute maxillary sinusitis, unspecified: Secondary | ICD-10-CM

## 2018-04-15 DIAGNOSIS — R0981 Nasal congestion: Secondary | ICD-10-CM

## 2018-04-15 MED ORDER — AMOXICILLIN 500 MG PO CAPS: 500.0000 mg | ORAL_CAPSULE | Freq: Three times a day (TID) | ORAL | 0 refills | Status: DC

## 2018-04-15 NOTE — ED Triage Notes (Signed)
Pt reports flu-like symptoms including h/a, congestion, cough, sore throat, chills. States she's been taking OTC meds without improvement, going on for almost a week now.

## 2018-04-15 NOTE — ED Notes (Signed)
Pt stable, ambulatory, states understanding of discharge instructions 

## 2018-04-15 NOTE — Discharge Instructions (Addendum)
Take the prescribed medication as directed.  I would continue decongestant/expectorant like mucinex, sudafed, etc. To help with congestion. Follow-up with your primary care doctor. Return to the ED for new or worsening symptoms.

## 2018-04-15 NOTE — ED Provider Notes (Signed)
[facility] Provider Note   CSN: [id number] Arrival date & time: 04/15/18  2038     History   Chief Complaint No chief complaint on file.   HPI Kelly Rush is a 32 y.o. female.  The history is provided by the patient and medical records.     32 year old female with history of anemia, HSV, presenting to the ED for cold symptoms for the past week.  She has been having pain and pressure in her face, fluid buildup in the ears, headache, slight cough, and body aches.  States her husband has been sick with similar symptoms as well.  She denies any fever.  Cough is been dry nonproductive.  She denies any chest pain or shortness of breath.  She is been taking numerous over-the-counter medications such as Mucinex, TheraFlu, NyQuil, etc. without any significant relief.  Past Medical History:  Diagnosis Date  . Anemia   . HSV (herpes simplex virus) infection     Patient Active Problem List   Diagnosis Date Noted  . Pregnancy 06/04/2014    History reviewed. No pertinent surgical history.   OB History    Gravida  3   Para  2   Term  2   Preterm      AB      Living  1     SAB      TAB      Ectopic      Multiple  0   Live Births  1            Home Medications    Prior to Admission medications   Medication Sig Start Date End Date Taking? Authorizing Provider  ibuprofen (ADVIL,MOTRIN) 800 MG tablet Take 1 tablet (800 mg total) by mouth every 8 (eight) hours as needed for moderate pain. Patient not taking: Reported on 05/15/2016 06/05/14   Julio Sicksurtis, Carol, NP  ondansetron (ZOFRAN ODT) 4 MG disintegrating tablet 4mg  ODT q4 hours prn nausea/vomit Patient not taking: Reported on 05/15/2016 06/08/14   Rolan BuccoBelfi, Melanie, MD  oxyCODONE-acetaminophen (PERCOCET/ROXICET) 5-325 MG per tablet Take 1 tablet by mouth every 4 (four) hours as needed (for pain scale less than 7). 06/05/14   Julio Sicksurtis, Carol, NP  Prenatal Vit-Fe Fumarate-FA (PRENATAL  MULTIVITAMIN) TABS tablet Take 1 tablet by mouth daily at 12 noon. 06/05/14   Julio Sicksurtis, Carol, NP    Family History History reviewed. No pertinent family history.  Social History Social History   Tobacco Use  . Smoking status: Never Smoker  . Smokeless tobacco: Never Used  Substance Use Topics  . Alcohol use: No  . Drug use: No    Frequency: 3.0 times per week     Allergies   Patient has no known allergies.   Review of Systems Review of Systems  HENT: Positive for congestion, sinus pressure, sinus pain and sore throat.   Respiratory: Positive for cough.   Musculoskeletal: Positive for myalgias.  All other systems reviewed and are negative.    Physical Exam Updated Vital Signs BP (!) 129/95 (BP Location: Right Arm)   Pulse 81   Temp 98.8 F (37.1 C) (Oral)   Resp 16   Ht 5\' 2"  (1.575 m)   Wt 60.3 kg   LMP 04/14/2018 (Exact Date)   SpO2 99%   Breastfeeding Unknown   BMI 24.33 kg/m   Physical Exam Vitals signs and nursing note reviewed.  Constitutional:      Appearance: She is well-developed.  HENT:  Head: Normocephalic and atraumatic.     Comments: Frontal and maxillary sinus tenderness, increased pressure of the face when leaning forward; nasal congestion with postnasal drip; TMs normal bilaterally; no tonsillar edema or exudates    Right Ear: Tympanic membrane and ear canal normal.     Left Ear: Tympanic membrane and ear canal normal.     Nose: Mucosal edema and congestion present.     Right Sinus: Maxillary sinus tenderness and frontal sinus tenderness present.     Left Sinus: Maxillary sinus tenderness and frontal sinus tenderness present.  Eyes:     Conjunctiva/sclera: Conjunctivae normal.     Pupils: Pupils are equal, round, and reactive to light.  Neck:     Musculoskeletal: Normal range of motion.  Cardiovascular:     Rate and Rhythm: Normal rate and regular rhythm.     Heart sounds: Normal heart sounds.  Pulmonary:     Effort: Pulmonary effort  is normal.     Breath sounds: Normal breath sounds. No decreased breath sounds, wheezing or rhonchi.  Abdominal:     General: Bowel sounds are normal.     Palpations: Abdomen is soft.  Musculoskeletal: Normal range of motion.  Skin:    General: Skin is warm and dry.  Neurological:     Mental Status: She is alert and oriented to person, place, and time.      ED Treatments / Results  Labs (all labs ordered are listed, but only abnormal results are displayed) Labs Reviewed - No data to display  EKG None  Radiology No results found.  Procedures Procedures (including critical care time)  Medications Ordered in ED Medications - No data to display   Initial Impression / Assessment and Plan / ED Course  I have reviewed the triage vital signs and the nursing notes.  Pertinent labs & imaging results that were available during my care of the patient were reviewed by me and considered in my medical decision making (see chart for details).  32 year old female here with cold-like symptoms for the past week.  Now mostly complains of increased pressure in her face.  She is afebrile and nontoxic.  Exam revealing nasal congestion and postnasal drip with frontal and maxillary sinus pressure.  She does have increased pressure in her face when leaning forward.  Her lungs are clear without any wheezes or rhonchi.  Patient likely has developed sinusitis.  Will start on abx, encouraged to continue OTC decongestant/expectorant.  Close follow-up with PCP.  Return here for any new/acute changes.  Final Clinical Impressions(s) / ED Diagnoses   Final diagnoses:  Nasal congestion  Acute maxillary sinusitis, recurrence not specified    ED Discharge Orders         Ordered    amoxicillin (AMOXIL) 500 MG capsule  3 times daily     04/15/18 2220           Garlon Hatchet, PA-C 04/15/18 2222    Melene Plan, DO 04/15/18 2325

## 2018-05-02 NOTE — L&D Delivery Note (Addendum)
Delivery Note At 1:38 PM a viable female was delivered via Vaginal, Spontaneous (Presentation: LOA ;  ).  APGAR: 8, 9; weight  pending.   Placenta status: delivered intact with 3 vc , .   with the following complications true knot.  Cord pH: NA  Anesthesia: epidural  Episiotomy: None Lacerations: None Suture Repair: NA Est. Blood Loss (mL): 50  Mom to postpartum.  Baby to Couplet care / Skin to Skin.  [name] 02/01/2019, 2:10 PM  Post-Placental IUD Insertion Procedure Note  Patient identified, informed consent signed prior to delivery, signed copy in chart, time out was performed.    Vaginal, labial and perineal areas thoroughly inspected for lacerations. No degree laceration identified   - IUD inserted with inserter per manufacturer's instructions.    Strings trimmed to the level of the introitus. Patient tolerated procedure well.   Patient given post procedure instructions and IUD care card with expiration date.  Patient is asked to keep IUD strings tucked in her vagina until her postpartum follow up visit in 4-6 weeks. Patient advised to abstain from sexual intercourse and pulling on strings before her follow-up visit. Patient verbalized an understanding of the plan of care and agrees.

## 2018-05-21 ENCOUNTER — Emergency Department (HOSPITAL_COMMUNITY)
Admission: EM | Admit: 2018-05-21 | Discharge: 2018-05-21 | Disposition: A | Discharge status: Home / Self Care | Payer: Medicaid Other | Attending: Emergency Medicine | Admitting: Emergency Medicine

## 2018-05-21 ENCOUNTER — Encounter (HOSPITAL_COMMUNITY): Payer: Self-pay | Admitting: *Deleted

## 2018-05-21 DIAGNOSIS — J101 Influenza due to other identified influenza virus with other respiratory manifestations: Secondary | ICD-10-CM | POA: Diagnosis not present

## 2018-05-21 DIAGNOSIS — J111 Influenza due to unidentified influenza virus with other respiratory manifestations: Secondary | ICD-10-CM

## 2018-05-21 DIAGNOSIS — Z79899 Other long term (current) drug therapy: Secondary | ICD-10-CM | POA: Insufficient documentation

## 2018-05-21 DIAGNOSIS — R69 Illness, unspecified: Secondary | ICD-10-CM

## 2018-05-21 DIAGNOSIS — R509 Fever, unspecified: Secondary | ICD-10-CM | POA: Diagnosis present

## 2018-05-21 MED ORDER — OSELTAMIVIR PHOSPHATE 75 MG PO CAPS
75.0000 mg | ORAL_CAPSULE | Freq: Once | ORAL | Status: AC
Start: 2018-05-21 — End: 2018-05-21
  Administered 2018-05-21: 75 mg via ORAL
  Filled 2018-05-21: qty 1

## 2018-05-21 MED ORDER — OSELTAMIVIR PHOSPHATE 75 MG PO CAPS: 75.0000 mg | ORAL_CAPSULE | Freq: Two times a day (BID) | ORAL | 0 refills | Status: DC

## 2018-05-21 MED ORDER — IBUPROFEN 800 MG PO TABS: 800.0000 mg | ORAL_TABLET | Freq: Three times a day (TID) | ORAL | 0 refills | Status: DC

## 2018-05-21 MED ORDER — BENZONATATE 100 MG PO CAPS: 100.0000 mg | ORAL_CAPSULE | Freq: Three times a day (TID) | ORAL | 0 refills | Status: DC

## 2018-05-21 MED ORDER — ACETAMINOPHEN 500 MG PO TABS
1000.0000 mg | ORAL_TABLET | Freq: Once | ORAL | Status: AC
  Administered 2018-05-21: 1000 mg via ORAL
  Filled 2018-05-21: qty 2

## 2018-05-21 MED ORDER — KETOROLAC TROMETHAMINE 60 MG/2ML IM SOLN
60.0000 mg | Freq: Once | INTRAMUSCULAR | Status: AC
  Administered 2018-05-21: 60 mg via INTRAMUSCULAR
  Filled 2018-05-21: qty 2

## 2018-05-21 NOTE — Discharge Instructions (Signed)
Your symptoms today are classic for the flu, please read the attached reading instructions about the flu, take the following medications exactly as prescribed   Tamiflu, 1 capsule twice a day for the next 5 days, we gave you the first dose tonight Ibuprofen, 800 mg 3 times a day as needed for aches, pains and fevers Tylenol, 1000 mg 3 times daily as needed for aches pains and fevers   Seek medical exam for any severe or worsening symptoms including severe chest pain, difficulty breathing, increasing nausea vomiting lightheadedness dizziness or any other worsening symptoms.  Otherwise see your doctor in 3 days for recheck

## 2018-05-21 NOTE — ED Provider Notes (Signed)
[facility] Provider Note   CSN: [id number] Arrival date & time: 05/21/18  1438     History   Chief Complaint Chief Complaint  Patient presents with  . Migraine    HPI Kelly Rush is a 33 y.o. female.  HPI  33 year old female, presents with a complaint of fever, headache, body aches, coughing, sore throat and a runny nose.  She started feeling poorly yesterday, temperature last night was low-grade, today was 101 and here is over 103.  She denies abdominal pain dysuria diarrhea or rashes.  She has had multiple sick contacts including a child who had an upper respiratory type illness.  Her husband had the flu 2 weeks ago.  Her symptoms have progressed, she has had no medications prior to arrival.  She states that her myalgias are diffuse as is her headache.  She has been ambulatory.  She does work as a Runner, broadcasting/film/videoteacher.  Past Medical History:  Diagnosis Date  . Anemia   . HSV (herpes simplex virus) infection     Patient Active Problem List   Diagnosis Date Noted  . Pregnancy 06/04/2014    History reviewed. No pertinent surgical history.   OB History    Gravida  3   Para  2   Term  2   Preterm      AB      Living  1     SAB      TAB      Ectopic      Multiple  0   Live Births  1            Home Medications    Prior to Admission medications   Medication Sig Start Date End Date Taking? Authorizing Provider  ibuprofen (ADVIL,MOTRIN) 800 MG tablet Take 1 tablet (800 mg total) by mouth 3 (three) times daily. 05/21/18   Eber HongMiller, Manjot Hinks, MD  oseltamivir (TAMIFLU) 75 MG capsule Take 1 capsule (75 mg total) by mouth every 12 (twelve) hours. 05/21/18   Eber HongMiller, Santos Sollenberger, MD  Prenatal Vit-Fe Fumarate-FA (PRENATAL MULTIVITAMIN) TABS tablet Take 1 tablet by mouth daily at 12 noon. 06/05/14   Julio Sicksurtis, Carol, NP    Family History History reviewed. No pertinent family history.  Social History Social History   Tobacco Use  . Smoking  status: Never Smoker  . Smokeless tobacco: Never Used  Substance Use Topics  . Alcohol use: No  . Drug use: No    Frequency: 3.0 times per week     Allergies   Patient has no known allergies.   Review of Systems Review of Systems  All other systems reviewed and are negative.    Physical Exam Updated Vital Signs BP 117/77   Pulse (!) 103   Temp 99.2 F (37.3 C) (Oral)   Resp 16   SpO2 98%   Physical Exam Vitals signs and nursing note reviewed.  Constitutional:      General: She is not in acute distress.    Appearance: She is well-developed.  HENT:     Head: Normocephalic and atraumatic.     Mouth/Throat:     Pharynx: No oropharyngeal exudate.  Eyes:     General: No scleral icterus.       Right eye: No discharge.        Left eye: No discharge.     Conjunctiva/sclera: Conjunctivae normal.     Pupils: Pupils are equal, round, and reactive to light.  Neck:     Musculoskeletal:  Normal range of motion and neck supple.     Thyroid: No thyromegaly.     Vascular: No JVD.  Cardiovascular:     Rate and Rhythm: Regular rhythm. Tachycardia present.     Heart sounds: Normal heart sounds. No murmur. No friction rub. No gallop.   Pulmonary:     Effort: Pulmonary effort is normal. No respiratory distress.     Breath sounds: Normal breath sounds. No wheezing or rales.  Abdominal:     General: Bowel sounds are normal. There is no distension.     Palpations: Abdomen is soft. There is no mass.     Tenderness: There is no abdominal tenderness.  Musculoskeletal: Normal range of motion.        General: No tenderness.  Lymphadenopathy:     Cervical: No cervical adenopathy.  Skin:    General: Skin is warm and dry.     Findings: No erythema or rash.  Neurological:     Mental Status: She is alert.     Coordination: Coordination normal.  Psychiatric:        Behavior: Behavior normal.      ED Treatments / Results  Labs (all labs ordered are listed, but only abnormal  results are displayed) Labs Reviewed - No data to display  EKG None  Radiology No results found.  Procedures Procedures (including critical care time)  Medications Ordered in ED Medications  ketorolac (TORADOL) injection 60 mg (60 mg Intramuscular Given 05/21/18 1520)  acetaminophen (TYLENOL) tablet 1,000 mg (1,000 mg Oral Given 05/21/18 1520)  oseltamivir (TAMIFLU) capsule 75 mg (75 mg Oral Given 05/21/18 1520)     Initial Impression / Assessment and Plan / ED Course  I have reviewed the triage vital signs and the nursing notes.  Pertinent labs & imaging results that were available during my care of the patient were reviewed by me and considered in my medical decision making (see chart for details).  Clinical Course as of May 21 1710  Mon May 21, 2018  1708 Vital signs have all but normalized, stable for discharge   [BM]    Clinical Course User Index [BM] Eber HongMiller, Luismiguel Lamere, MD    Other than the fever and the tachycardia the patient has what appears to be a fairly classic viral flulike illness.  At this time I think it would be reasonable to treat with Tamiflu Tylenol and an anti-inflammatory to get her temperature down, myalgias and headache under control.  She is agreeable.  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  She is not hypotensive and with clear lungs I do not think she needs an x-ray.  This is the peak of flu season.  Will avoid unnecessary testing at this time  Final Clinical Impressions(s) / ED Diagnoses   Final diagnoses:  Influenza-like illness    ED Discharge Orders         Ordered    oseltamivir (TAMIFLU) 75 MG capsule  Every 12 hours     05/21/18 1710    ibuprofen (ADVIL,MOTRIN) 800 MG tablet  3 times daily     05/21/18 1710           Eber HongMiller, Denesha Brouse, MD 05/21/18 1712

## 2018-05-21 NOTE — ED Notes (Signed)
Patient verbalizes understanding of discharge instructions. Opportunity for questioning and answers were provided. Armband removed by staff, pt discharged from ED.  

## 2018-05-21 NOTE — ED Triage Notes (Signed)
Pt in c/o migraine since yesterday, also body aches with cough and congestion that started last night, no distress noted

## 2018-07-23 ENCOUNTER — Encounter: Payer: Self-pay | Admitting: Obstetrics

## 2018-07-23 ENCOUNTER — Ambulatory Visit (INDEPENDENT_AMBULATORY_CARE_PROVIDER_SITE_OTHER): Payer: Medicaid Other | Admitting: Obstetrics

## 2018-07-23 ENCOUNTER — Other Ambulatory Visit: Payer: Self-pay

## 2018-07-23 ENCOUNTER — Other Ambulatory Visit (HOSPITAL_COMMUNITY)
Admission: RE | Admit: 2018-07-23 | Discharge: 2018-07-23 | Disposition: A | Discharge status: Home / Self Care | Payer: Medicaid Other | Source: Ambulatory Visit | Attending: Obstetrics | Admitting: Obstetrics

## 2018-07-23 VITALS — BP 132/73 | HR 85 | Wt 134.0 lb

## 2018-07-23 DIAGNOSIS — Z3480 Encounter for supervision of other normal pregnancy, unspecified trimester: Secondary | ICD-10-CM | POA: Insufficient documentation

## 2018-07-23 DIAGNOSIS — O98311 Other infections with a predominantly sexual mode of transmission complicating pregnancy, first trimester: Secondary | ICD-10-CM

## 2018-07-23 DIAGNOSIS — Z3481 Encounter for supervision of other normal pregnancy, first trimester: Secondary | ICD-10-CM | POA: Diagnosis not present

## 2018-07-23 DIAGNOSIS — O98319 Other infections with a predominantly sexual mode of transmission complicating pregnancy, unspecified trimester: Secondary | ICD-10-CM

## 2018-07-23 DIAGNOSIS — A6009 Herpesviral infection of other urogenital tract: Secondary | ICD-10-CM

## 2018-07-23 DIAGNOSIS — Z3A11 11 weeks gestation of pregnancy: Secondary | ICD-10-CM

## 2018-07-23 MED ORDER — VALACYCLOVIR HCL 1 G PO TABS: 1000.0000 mg | ORAL_TABLET | Freq: Every day | ORAL | 11 refills | Status: DC

## 2018-07-23 MED ORDER — VITAFOL GUMMIES 3.33-0.333-34.8 MG PO CHEW
3.0000 | CHEWABLE_TABLET | Freq: Every day | ORAL | 11 refills | Status: DC
Start: 2018-07-23 — End: 2023-07-16

## 2018-07-23 MED ORDER — OMEPRAZOLE 20 MG PO CPDR: 20.0000 mg | DELAYED_RELEASE_CAPSULE | Freq: Two times a day (BID) | ORAL | 5 refills | Status: DC

## 2018-07-23 NOTE — Progress Notes (Signed)
Subjective:    Kelly Rush is being seen today for her first obstetrical visit.  This is not a planned pregnancy. She is at [redacted]w[redacted]d gestation. Her obstetrical history is significant for none. Relationship with FOB: significant other, not living together. Patient does intend to breast feed. Pregnancy history fully reviewed.  The information documented in the HPI was reviewed and verified.  Menstrual History: OB History    Gravida  3   Para  2   Term  2   Preterm      AB      Living  2     SAB      TAB      Ectopic      Multiple  0   Live Births  2            Patient's last menstrual period was 05/05/2018.    Past Medical History:  Diagnosis Date  . Anemia   . HSV (herpes simplex virus) infection     History reviewed. No pertinent surgical history.  (Not in a hospital admission)  No Known Allergies  Social History   Tobacco Use  . Smoking status: Never Smoker  . Smokeless tobacco: Never Used  Substance Use Topics  . Alcohol use: No    History reviewed. No pertinent family history.   Review of Systems Constitutional: negative for weight loss Gastrointestinal: negative for vomiting Genitourinary:negative for genital lesions and vaginal discharge and dysuria Musculoskeletal:negative for back pain Behavioral/Psych: negative for abusive relationship, depression, illegal drug usage and tobacco use    Objective:    BP 132/73   Pulse 85   Wt 134 lb (60.8 kg)   LMP 05/05/2018   BMI 24.51 kg/m  General Appearance:    Alert, cooperative, no distress, appears stated age  Head:    Normocephalic, without obvious abnormality, atraumatic  Eyes:    PERRL, conjunctiva/corneas clear, EOM's intact, fundi    benign, both eyes  Ears:    Normal TM's and external ear canals, both ears  Nose:   Nares normal, septum midline, mucosa normal, no drainage    or sinus tenderness  Throat:   Lips, mucosa, and tongue normal; teeth and gums normal  Neck:   Supple,  symmetrical, trachea midline, no adenopathy;    thyroid:  no enlargement/tenderness/nodules; no carotid   bruit or JVD  Back:     Symmetric, no curvature, ROM normal, no CVA tenderness  Lungs:     Clear to auscultation bilaterally, respirations unlabored  Chest Wall:    No tenderness or deformity   Heart:    Regular rate and rhythm, S1 and S2 normal, no murmur, rub   or gallop  Breast Exam:    No tenderness, masses, or nipple abnormality  Abdomen:     Soft, non-tender, bowel sounds active all four quadrants,    no masses, no organomegaly  Genitalia:    Normal female without lesion, discharge or tenderness  Extremities:   Extremities normal, atraumatic, no cyanosis or edema  Pulses:   2+ and symmetric all extremities  Skin:   Skin color, texture, turgor normal, no rashes or lesions  Lymph nodes:   Cervical, supraclavicular, and axillary nodes normal  Neurologic:   CNII-XII intact, normal strength, sensation and reflexes    throughout      Lab Review Urine pregnancy test Labs reviewed yes Radiologic studies reviewed no Assessment:    Pregnancy at [redacted]w[redacted]d weeks    Plan:     1. Supervision of  other normal pregnancy, antepartum Rx: - Cytology - PAP( [place]) - Cervicovaginal ancillary only( [place]) - Culture, OB Urine - Obstetric Panel, Including HIV - Genetic Screening - Enroll Patient in Babyscripts - Babyscripts Schedule Optimization - Prenatal Vit-Fe Phos-FA-Omega (VITAFOL GUMMIES) 3.33-0.333-34.8 MG CHEW; Chew 3 tablets by mouth daily before breakfast.  Dispense: 90 tablet; Refill: 11 - omeprazole (PRILOSEC) 20 MG capsule; Take 1 capsule (20 mg total) by mouth 2 (two) times daily before a meal.  Dispense: 60 capsule; Refill: 5  2. Genital herpes affecting pregnancy, antepartum Rx: - valACYclovir (VALTREX) 1000 MG tablet; Take 1 tablet (1,000 mg total) by mouth daily. FOR SUPPRESSION.  Dispense: 30 tablet; Refill: 11  Prenatal vitamins.  Counseling provided  regarding continued use of seat belts, cessation of alcohol consumption, smoking or use of illicit drugs; infection precautions i.e., influenza/TDAP immunizations, toxoplasmosis,CMV, parvovirus, listeria and varicella; workplace safety, exercise during pregnancy; routine dental care, safe medications, sexual activity, hot tubs, saunas, pools, travel, caffeine use, fish and methlymercury, potential toxins, hair treatments, varicose veins Weight gain recommendations per IOM guidelines reviewed: underweight/BMI< 18.5--> gain 28 - 40 lbs; normal weight/BMI 18.5 - 24.9--> gain 25 - 35 lbs; overweight/BMI 25 - 29.9--> gain 15 - 25 lbs; obese/BMI >30->gain  11 - 20 lbs Problem list reviewed and updated. FIRST/CF mutation testing/NIPT/QUAD SCREEN/fragile X/Ashkenazi Jewish population testing/Spinal muscular atrophy discussed: requested. Role of ultrasound in pregnancy discussed; fetal survey: requested. Amniocentesis discussed: not indicated.  Meds ordered this encounter  Medications  . Prenatal Vit-Fe Phos-FA-Omega (VITAFOL GUMMIES) 3.33-0.333-34.8 MG CHEW    Sig: Chew 3 tablets by mouth daily before breakfast.    Dispense:  90 tablet    Refill:  11  . omeprazole (PRILOSEC) 20 MG capsule    Sig: Take 1 capsule (20 mg total) by mouth 2 (two) times daily before a meal.    Dispense:  60 capsule    Refill:  5  . valACYclovir (VALTREX) 1000 MG tablet    Sig: Take 1 tablet (1,000 mg total) by mouth daily. FOR SUPPRESSION.    Dispense:  30 tablet    Refill:  11   Orders Placed This Encounter  Procedures  . Culture, OB Urine  . Obstetric Panel, Including HIV  . Genetic Screening    Follow up in 4 weeks. 50% of 20 min visit spent on counseling and coordination of care.     Brock Bad MD 07-23-2018

## 2018-07-24 LAB — CERVICOVAGINAL ANCILLARY ONLY
Bacterial vaginitis: POSITIVE — AB
Candida vaginitis: POSITIVE — AB
Chlamydia: NEGATIVE
NEISSERIA GONORRHEA: NEGATIVE
TRICH (WINDOWPATH): NEGATIVE

## 2018-07-24 LAB — OBSTETRIC PANEL, INCLUDING HIV
Antibody Screen: NEGATIVE
Basophils Absolute: 0 10*3/uL (ref 0.0–0.2)
Basos: 0 %
EOS (ABSOLUTE): 0 10*3/uL (ref 0.0–0.4)
Eos: 0 %
HEMATOCRIT: 33.4 % — AB (ref 34.0–46.6)
HEMOGLOBIN: 11 g/dL — AB (ref 11.1–15.9)
HEP B S AG: NEGATIVE
HIV Screen 4th Generation wRfx: NONREACTIVE
IMMATURE GRANULOCYTES: 0 %
Immature Grans (Abs): 0 10*3/uL (ref 0.0–0.1)
LYMPHS: 26 %
Lymphocytes Absolute: 1.5 10*3/uL (ref 0.7–3.1)
MCH: 27.5 pg (ref 26.6–33.0)
MCHC: 32.9 g/dL (ref 31.5–35.7)
MCV: 84 fL (ref 79–97)
MONOS ABS: 0.4 10*3/uL (ref 0.1–0.9)
Monocytes: 6 %
NEUTROS PCT: 68 %
Neutrophils Absolute: 3.8 10*3/uL (ref 1.4–7.0)
PLATELETS: 240 10*3/uL (ref 150–450)
RBC: 4 x10E6/uL (ref 3.77–5.28)
RDW: 14.6 % (ref 11.7–15.4)
RPR Ser Ql: NONREACTIVE
Rh Factor: POSITIVE
Rubella Antibodies, IGG: 1.84 index (ref >0.99)
WBC: 5.7 10*3/uL (ref 3.4–10.8)

## 2018-07-24 LAB — CYTOLOGY - PAP
DIAGNOSIS: NEGATIVE
HPV: NOT DETECTED

## 2018-07-25 ENCOUNTER — Other Ambulatory Visit: Payer: Self-pay | Admitting: Obstetrics

## 2018-07-25 DIAGNOSIS — N760 Acute vaginitis: Principal | ICD-10-CM

## 2018-07-25 DIAGNOSIS — B9689 Other specified bacterial agents as the cause of diseases classified elsewhere: Secondary | ICD-10-CM

## 2018-07-25 DIAGNOSIS — B373 Candidiasis of vulva and vagina: Secondary | ICD-10-CM

## 2018-07-25 DIAGNOSIS — B3731 Acute candidiasis of vulva and vagina: Secondary | ICD-10-CM

## 2018-07-25 LAB — CULTURE, OB URINE

## 2018-07-25 LAB — URINE CULTURE, OB REFLEX

## 2018-07-25 MED ORDER — TINIDAZOLE 500 MG PO TABS: 1000.0000 mg | ORAL_TABLET | Freq: Every day | ORAL | 2 refills | Status: DC

## 2018-07-25 MED ORDER — TERCONAZOLE 0.4 % VA CREA: 1.0000 | TOPICAL_CREAM | Freq: Every day | VAGINAL | 0 refills | Status: DC

## 2018-07-31 ENCOUNTER — Encounter: Payer: Self-pay | Admitting: Obstetrics

## 2018-07-31 ENCOUNTER — Other Ambulatory Visit: Payer: Self-pay | Admitting: Obstetrics

## 2018-07-31 DIAGNOSIS — O285 Abnormal chromosomal and genetic finding on antenatal screening of mother: Secondary | ICD-10-CM

## 2018-08-20 ENCOUNTER — Encounter: Payer: Medicaid Other | Admitting: Obstetrics

## 2018-08-20 ENCOUNTER — Ambulatory Visit (INDEPENDENT_AMBULATORY_CARE_PROVIDER_SITE_OTHER): Payer: Medicaid Other | Admitting: Advanced Practice Midwife

## 2018-08-20 ENCOUNTER — Other Ambulatory Visit: Payer: Self-pay

## 2018-08-20 DIAGNOSIS — O26892 Other specified pregnancy related conditions, second trimester: Secondary | ICD-10-CM

## 2018-08-20 DIAGNOSIS — Z3480 Encounter for supervision of other normal pregnancy, unspecified trimester: Secondary | ICD-10-CM

## 2018-08-20 DIAGNOSIS — G479 Sleep disorder, unspecified: Secondary | ICD-10-CM | POA: Diagnosis not present

## 2018-08-20 DIAGNOSIS — R12 Heartburn: Secondary | ICD-10-CM

## 2018-08-20 DIAGNOSIS — Z3A15 15 weeks gestation of pregnancy: Secondary | ICD-10-CM

## 2018-08-20 MED ORDER — PANTOPRAZOLE SODIUM 40 MG PO TBEC: 40.0000 mg | DELAYED_RELEASE_TABLET | Freq: Every day | ORAL | 5 refills | Status: DC

## 2018-08-20 NOTE — Progress Notes (Signed)
   TELEHEALTH VIRTUAL OBSTETRICS VISIT ENCOUNTER NOTE  I connected with Kelly Rush on 08/20/18 at  8:55 AM EDT by telephone at home and verified that I am speaking with the correct person using two identifiers.   I discussed the limitations, risks, security and privacy concerns of performing an evaluation and management service by telephone and the availability of in person appointments. I also discussed with the patient that there may be a patient responsible charge related to this service. The patient expressed understanding and agreed to proceed.  Subjective:  Kelly Rush is a 33 y.o. G3P2002 at [redacted]w[redacted]d being followed for ongoing prenatal care.  She is currently monitored for the following issues for this low-risk pregnancy and has Supervision of other normal pregnancy, antepartum on their problem list.  Patient reports heartburn. Reports fetal movement. Denies any contractions, bleeding or leaking of fluid.   The following portions of the patient's history were reviewed and updated as appropriate: allergies, current medications, past family history, past medical history, past social history, past surgical history and problem list.   Objective:   General:  Alert, oriented and cooperative.   Mental Status: Normal mood and affect perceived. Normal judgment and thought content.  Rest of physical exam deferred due to type of encounter  Assessment and Plan:  Pregnancy: G3P2002 at [redacted]w[redacted]d 1. Supervision of other normal pregnancy, antepartum --Anticipatory guidance about next visits/weeks of pregnancy given.  - [place] MFM OB COMP + 14 WK; Future  2. Heartburn during pregnancy in second trimester --Pt taking Prilosec BID but still daily heartburn.  Change to Protonix daily, may take Tums occasionally for breakthrough symptoms. - pantoprazole (PROTONIX) 40 MG tablet; Take 1 tablet (40 mg total) by mouth daily.  Dispense: 30 tablet; Refill: 5  3. Sleep disturbance --Pt having trouble  sleeping, is normally a stomach sleeper, cannot get comfortable. --Discussed sleep hygiene, warm bath, pillows.  May use Tylenol and/or Benadryl occasionally.  Preterm labor symptoms and general obstetric precautions including but not limited to vaginal bleeding, contractions, leaking of fluid and fetal movement were reviewed in detail with the patient.  I discussed the assessment and treatment plan with the patient. The patient was provided an opportunity to ask questions and all were answered. The patient agreed with the plan and demonstrated an understanding of the instructions. The patient was advised to call back or seek an in-person office evaluation/go to [facility]U at [facility] for any urgent or concerning symptoms. Please refer to After Visit Summary for other counseling recommendations.   I provided 10 minutes of non-face-to-face time during this encounter.  Return in about 4 weeks (around 09/17/2018).  Future Appointments  Date Time Provider Department Center  09/17/2018 10:45 AM [facility] [place] 2 WH-[facility] M[facility]-US    Sharen Counter, CNM Center for [employer], [facility]

## 2018-08-20 NOTE — Progress Notes (Signed)
Pt is on the phone for televisit. Right patient confirmed with identifiers. [redacted]w[redacted]d Babyscripts optimization order put in, pt advised she will be receiving an email and a kit to check blood pressure.

## 2018-09-17 ENCOUNTER — Encounter: Payer: Medicaid Other | Admitting: Advanced Practice Midwife

## 2018-09-17 ENCOUNTER — Encounter (HOSPITAL_COMMUNITY): Payer: Self-pay

## 2018-09-17 ENCOUNTER — Other Ambulatory Visit: Payer: Self-pay

## 2018-09-17 ENCOUNTER — Other Ambulatory Visit: Payer: Self-pay | Admitting: Advanced Practice Midwife

## 2018-09-17 ENCOUNTER — Ambulatory Visit (HOSPITAL_COMMUNITY)
Admission: RE | Admit: 2018-09-17 | Discharge: 2018-09-17 | Disposition: A | Discharge status: Home / Self Care | Payer: Medicaid Other | Source: Ambulatory Visit | Attending: Obstetrics and Gynecology | Admitting: Obstetrics and Gynecology

## 2018-09-17 ENCOUNTER — Ambulatory Visit (INDEPENDENT_AMBULATORY_CARE_PROVIDER_SITE_OTHER): Payer: Medicaid Other | Admitting: Advanced Practice Midwife

## 2018-09-17 VITALS — BP 106/70

## 2018-09-17 DIAGNOSIS — O99012 Anemia complicating pregnancy, second trimester: Secondary | ICD-10-CM | POA: Diagnosis not present

## 2018-09-17 DIAGNOSIS — Z3A19 19 weeks gestation of pregnancy: Secondary | ICD-10-CM

## 2018-09-17 DIAGNOSIS — Z3480 Encounter for supervision of other normal pregnancy, unspecified trimester: Secondary | ICD-10-CM

## 2018-09-17 DIAGNOSIS — R102 Pelvic and perineal pain: Secondary | ICD-10-CM

## 2018-09-17 DIAGNOSIS — O26892 Other specified pregnancy related conditions, second trimester: Secondary | ICD-10-CM

## 2018-09-17 DIAGNOSIS — Z148 Genetic carrier of other disease: Secondary | ICD-10-CM

## 2018-09-17 DIAGNOSIS — Z363 Encounter for antenatal screening for malformations: Secondary | ICD-10-CM

## 2018-09-17 MED ORDER — COMFORT FIT MATERNITY SUPP MED MISC
1.0000 | Freq: Every day | 0 refills | Status: DC
Start: 2018-09-17 — End: 2023-07-16

## 2018-09-17 NOTE — Patient Instructions (Signed)

## 2018-09-17 NOTE — Progress Notes (Signed)
Pt is on the phone preparing for Webex visit with provider. Anatomy US scheduled for today. [redacted]w[redacted]d.

## 2018-09-17 NOTE — Progress Notes (Signed)
   TELEHEALTH VIRTUAL OBSTETRICS PRENATAL VISIT ENCOUNTER NOTE  I connected with Kelly Rush on 09/17/18 at  8:30 AM EDT by WebEx at home and verified that I am speaking with the correct person using two identifiers.   I discussed the limitations, risks, security and privacy concerns of performing an evaluation and management service by telephone and the availability of in person appointments. I also discussed with the patient that there may be a patient responsible charge related to this service. The patient expressed understanding and agreed to proceed. Subjective:  Kelly Rush is a 33 y.o. G3P2002 at 101w2d being seen today for ongoing prenatal care.  She is currently monitored for the following issues for this low-risk pregnancy and has Supervision of other normal pregnancy, antepartum on their problem list.  Patient reports pain in low pelvis with walking.  Reports fetal movement. Contractions: Irritability. Vag. Bleeding: None.  Movement: Present. Denies any contractions, bleeding or leaking of fluid.   The following portions of the patient's history were reviewed and updated as appropriate: allergies, current medications, past family history, past medical history, past social history, past surgical history and problem list.   Objective:   Vitals:   09/17/18 0840  BP: 106/70    Fetal Status:     Movement: Present     General:  Alert, oriented and cooperative. Patient is in no acute distress.  Respiratory: Normal respiratory effort, no problems with respiration noted  Mental Status: Normal mood and affect. Normal behavior. Normal judgment and thought content.  Rest of physical exam deferred due to type of encounter  Assessment and Plan:  Pregnancy: G3P2002 at [redacted]w[redacted]d 1. Pelvic pain affecting pregnancy in second trimester, antepartum --Rest, ice, heat, warm bath, Tylenol. - Elastic Bandages & Supports (COMFORT FIT MATERNITY SUPP MED) MISC; 1 Device by Does not apply route daily.   Dispense: 1 each; Refill: 0  2. Supervision of other normal pregnancy, antepartum --Anticipatory guidance about next visits/weeks of pregnancy given. --Reviewed safety, visitor policy, reassurance about COVID-19 for pregnancy at this time. Discussed possible changes to visits, including televisits, that may occur due to COVID-19.  The office remains open if pt needs to be seen and MAU is open 24 hours/day for OB emergencies.   --Next visit video visit, at 23-24 weeks since this visit is before 20 weeks, will be on babyscripts schedule starting at 28 week visit.  Preterm labor symptoms and general obstetric precautions including but not limited to vaginal bleeding, contractions, leaking of fluid and fetal movement were reviewed in detail with the patient. I discussed the assessment and treatment plan with the patient. The patient was provided an opportunity to ask questions and all were answered. The patient agreed with the plan and demonstrated an understanding of the instructions. The patient was advised to call back or seek an in-person office evaluation/go to MAU at [facility] for any urgent or concerning symptoms. Please refer to After Visit Summary for other counseling recommendations.   I provided 12 minutes of face-to-face via WebEx time during this encounter.  No follow-ups on file.  Future Appointments  Date Time Provider Department Center  09/17/2018 10:45 AM WH-MFC [place] 2 WH-MFCUS MFC-US    Sharen Counter, CNM Center for [employer], [facility]

## 2018-10-15 ENCOUNTER — Telehealth: Payer: Self-pay | Admitting: *Deleted

## 2018-10-15 NOTE — Telephone Encounter (Signed)
Pt called and left message for yeast tx. Attempt to return call to verify problem and pharmacy.  LM on VM to call office.

## 2018-10-24 ENCOUNTER — Telehealth: Payer: Self-pay

## 2018-10-24 DIAGNOSIS — B3731 Acute candidiasis of vulva and vagina: Secondary | ICD-10-CM

## 2018-10-24 DIAGNOSIS — B373 Candidiasis of vulva and vagina: Secondary | ICD-10-CM

## 2018-10-24 MED ORDER — TERCONAZOLE 0.4 % VA CREA: 1.0000 | TOPICAL_CREAM | Freq: Every day | VAGINAL | 0 refills | Status: AC

## 2018-10-24 NOTE — Telephone Encounter (Signed)
Pt c/o another yeast infection. Terconazole sent to the pharmacy per protocol.  Pt also c/o BH's, denies LOF, denies VB, +FM.  Informed pt to increase her water intake and take Tylenol.  If sx's worsens, to contact the office.

## 2018-11-12 ENCOUNTER — Other Ambulatory Visit: Payer: Self-pay

## 2018-11-12 ENCOUNTER — Encounter: Payer: Self-pay | Admitting: Obstetrics

## 2018-11-12 ENCOUNTER — Encounter: Payer: Self-pay | Admitting: Obstetrics and Gynecology

## 2018-11-12 ENCOUNTER — Ambulatory Visit (INDEPENDENT_AMBULATORY_CARE_PROVIDER_SITE_OTHER): Payer: Medicaid Other | Admitting: Obstetrics and Gynecology

## 2018-11-12 VITALS — BP 110/74 | HR 86 | Temp 97.9°F | Wt 161.0 lb

## 2018-11-12 DIAGNOSIS — Z3480 Encounter for supervision of other normal pregnancy, unspecified trimester: Secondary | ICD-10-CM

## 2018-11-12 DIAGNOSIS — O26892 Other specified pregnancy related conditions, second trimester: Secondary | ICD-10-CM

## 2018-11-12 DIAGNOSIS — Z23 Encounter for immunization: Secondary | ICD-10-CM

## 2018-11-12 DIAGNOSIS — M5432 Sciatica, left side: Secondary | ICD-10-CM

## 2018-11-12 DIAGNOSIS — Z3A27 27 weeks gestation of pregnancy: Secondary | ICD-10-CM

## 2018-11-12 NOTE — Progress Notes (Signed)
ROB  With complaints of Back pain Pt states she gets mild relief w/ support belt.  T-Dap today

## 2018-11-12 NOTE — Progress Notes (Signed)
   PRENATAL VISIT NOTE  Subjective:  Kelly Rush is a 33 y.o. G3P2002 at [redacted]w[redacted]d being seen today for ongoing prenatal care.  She is currently monitored for the following issues for this low-risk pregnancy and has Supervision of other normal pregnancy, antepartum on their problem list.  Patient reports backache and left hip pain.  Contractions: Irritability. Vag. Bleeding: None.  Movement: Present. Denies leaking of fluid.   The following portions of the patient's history were reviewed and updated as appropriate: allergies, current medications, past family history, past medical history, past social history, past surgical history and problem list.   Objective:   Vitals:   11/12/18 0832  BP: 110/74  Pulse: 86  Temp: 97.9 F (36.6 C)  Weight: 161 lb (73 kg)    Fetal Status: Fetal Heart Rate (bpm): 158 Fundal Height: 28 cm Movement: Present     General:  Alert, oriented and cooperative. Patient is in no acute distress.  Skin: Skin is warm and dry. No rash noted.   Cardiovascular: Normal heart rate noted  Respiratory: Normal respiratory effort, no problems with respiration noted  Abdomen: Soft, gravid, appropriate for gestational age.  Pain/Pressure: Present     Pelvic: Cervical exam deferred        Extremities: Normal range of motion.  Edema: Trace  Mental Status: Normal mood and affect. Normal behavior. Normal judgment and thought content.   Assessment and Plan:  Pregnancy: G3P2002 at [redacted]w[redacted]d 1. Supervision of other normal pregnancy, antepartum Patient is doing well Third trimester labs today Tdap today Patient encouraged to use maternity support belt and stay active Patient referred to PT  - Glucose Tolerance, 2 Hours w/1 Hour - HIV antibody (with reflex) - RPR - CBC - Ambulatory referral to Physical Therapy  2. Need for Tdap vaccination  - Tdap vaccine greater than or equal to 7yo IM  3. Sciatic nerve pain, left  - Ambulatory referral to Physical Therapy  Preterm  labor symptoms and general obstetric precautions including but not limited to vaginal bleeding, contractions, leaking of fluid and fetal movement were reviewed in detail with the patient. Please refer to After Visit Summary for other counseling recommendations.   Return in about 4 weeks (around 12/10/2018) for [facility] [place].  No future appointments.  Mora Bellman, MD

## 2018-11-13 LAB — GLUCOSE TOLERANCE, 2 HOURS W/ 1HR
Glucose, 1 hour: 153 mg/dL (ref 65–179)
Glucose, 2 hour: 109 mg/dL (ref 65–152)
Glucose, Fasting: 85 mg/dL (ref 65–91)

## 2018-11-13 LAB — RPR: RPR Ser Ql: NONREACTIVE

## 2018-11-13 LAB — CBC
Hematocrit: 31.8 % — ABNORMAL LOW (ref 34.0–46.6)
Hemoglobin: 10.6 g/dL — ABNORMAL LOW (ref 11.1–15.9)
MCH: 29.5 pg (ref 26.6–33.0)
MCHC: 33.3 g/dL (ref 31.5–35.7)
MCV: 89 fL (ref 79–97)
Platelets: 205 10*3/uL (ref 150–450)
RBC: 3.59 x10E6/uL — ABNORMAL LOW (ref 3.77–5.28)
RDW: 12.9 % (ref 11.7–15.4)
WBC: 6.9 10*3/uL (ref 3.4–10.8)

## 2018-11-13 LAB — HIV ANTIBODY (ROUTINE TESTING W REFLEX): HIV Screen 4th Generation wRfx: NONREACTIVE

## 2018-11-21 ENCOUNTER — Encounter: Payer: Self-pay | Admitting: Physical Therapy

## 2018-11-21 ENCOUNTER — Ambulatory Visit: Payer: Medicaid Other | Attending: Obstetrics and Gynecology | Admitting: Physical Therapy

## 2018-11-21 ENCOUNTER — Other Ambulatory Visit: Payer: Self-pay

## 2018-11-21 DIAGNOSIS — M6281 Muscle weakness (generalized): Secondary | ICD-10-CM

## 2018-11-21 DIAGNOSIS — Z3492 Encounter for supervision of normal pregnancy, unspecified, second trimester: Secondary | ICD-10-CM

## 2018-11-21 DIAGNOSIS — M5442 Lumbago with sciatica, left side: Secondary | ICD-10-CM

## 2018-11-21 NOTE — Therapy (Signed)
[facility] [address] [place], [place], [address] Phone: [phone]   Fax:  [phone]  Physical Therapy Evaluation  Patient Details  Name: Kelly Rush MRN: [id number] Date of Birth: 08-05-1985 Referring Provider (PT): Dr. Mora Bellman   Encounter Date: 11/21/2018  PT End of Session - 11/21/18 0842    Visit Number  1    Date for PT Re-Evaluation  02/13/19    Authorization Type  Medicaid    PT Start Time  0800    PT Stop Time  0838    PT Time Calculation (min)  38 min    Activity Tolerance  Patient tolerated treatment well    Behavior During Therapy  [facility] for tasks assessed/performed       Past Medical History:  Diagnosis Date  . Anemia   . HSV (herpes simplex virus) infection     History reviewed. No pertinent surgical history.  There were no vitals filed for this visit.   Subjective Assessment - 11/21/18 0809    Subjective  Patient is [redacted] weeks pregnant. Patient has had issues with hips and low back. This is patients third pregnancy, 10year and 6 year.    Limitations  Standing;Sitting;Walking    How long can you sit comfortably?  30 min    How long can you stand comfortably?  30 min    How long can you walk comfortably?  15 min    Patient Stated Goals  Want to know how to manage pain and ease it    Currently in Pain?  Yes    Pain Score  9     Pain Location  Back   hips   Pain Orientation  Left;Lower    Pain Descriptors / Indicators  Pressure;Aching   grinding in pelvis   Pain Type  Acute pain    Pain Onset  More than a month ago    Pain Frequency  Intermittent    Aggravating Factors   standing, walking, movement, sitting    Pain Relieving Factors  massage, stand to sit, brace         [facility] PT Assessment - 11/21/18 0001      Assessment   Medical Diagnosis  Z34.80 Supervision of other normal pregnancy antepartum; M54.32 Sciatic nerve pain, left    Referring Provider (PT)  Dr. Vickii Chafe  Constant    Onset Date/Surgical Date  08/01/18   due date 02/09/2019   Prior Therapy  none      Precautions   Precautions  Other (comment)    Precaution Comments  [redacted] weeks pregnant, due 02/09/19      Restrictions   Weight Bearing Restrictions  No      Balance Screen   Has the patient fallen in the past 6 months  No    Has the patient had a decrease in activity level because of a fear of falling?   No    Is the patient reluctant to leave their home because of a fear of falling?   No      Home Film/video editor residence      Prior Function   Level of Independence  Independent    Vocation  Full time employment    Equities trader    Leisure  walking      Cognition   Overall Cognitive Status  Within Functional Limits for tasks assessed      ROM / Strength   AROM /  PROM / Strength  AROM;PROM;Strength      AROM   Lumbar Flexion  25% limited    Lumbar - Right Side Bend  25% limited, rotate right      Strength   Overall Strength Comments  hard to stand on left left for 1-2 seconds    Left Hip ABduction  4/5    Left Hip ADduction  4/5      Palpation   Spinal mobility  decreased movement of T11-L1 and L3-L5    SI assessment   left ilium is rotatd posteriorly    Palpation comment  palpable tenderness located in the left lumbar quadratus and paraspinals                Objective measurements completed on examination: See above findings.    Pelvic Floor Special Questions - 11/21/18 0001    Prior Pregnancies  Yes    Number of Pregnancies  3    Number of Vaginal Deliveries  2   round ligament pain   Urinary Leakage  No    Fecal incontinence  No   consitpation      [facility] Adult PT Treatment/Exercise - 11/21/18 0001      Therapeutic Activites    Therapeutic Activities  ADL's    ADL's  rolling in bed, supine to sit, sit to stand with breath and keeping knees together      Lumbar Exercises: Standing   Other Standing Lumbar  Exercises  pelvic tilt in standing against the wall      Lumbar Exercises: Seated   Other Seated Lumbar Exercises  pelvic tilt in sitting      Lumbar Exercises: Quadruped   [place]/Old Horse  10 reps      Manual Therapy   Manual Therapy  Joint mobilization;Muscle Energy Technique    Joint Mobilization  rotational mobilization to T11-L5 in right sidebending    Muscle Energy Technique  correct left ilium in sidely             PT Education - 11/21/18 0840    Education Details  Access Code: [email or web address]    Person(s) Educated  Patient    Methods  Explanation;Demonstration;Verbal cues;Handout    Comprehension  Returned demonstration;Verbalized understanding       PT Short Term Goals - 11/21/18 0847      PT SHORT TERM GOAL #1   Title  independent with initial HEP    Baseline  not educated yet    Time  4    Period  Weeks    Status  New    Target Date  12/19/18        PT Long Term Goals - 11/21/18 0848      PT LONG TERM GOAL #1   Title  independent with HEP and understand how to progress herself    Baseline  not educated yet    Time  12    Period  Weeks    Status  New    Target Date  02/13/19      PT LONG TERM GOAL #2   Title  understand ways to manage her pain with correct body mechanics with daily activities    Time  12    Period  Weeks    Status  New    Target Date  02/13/19      PT LONG TERM GOAL #3   Title  able to sit and stand for 25 to 30 minutes with moderate weight shifting due to pelvis  in correct alignment and pain decreased >/= 4/10    Baseline  pain level 9/10, left ilium rotated posteriorly    Time  12    Period  Weeks    Status  New    Target Date  02/13/19      PT LONG TERM GOAL #4   Title  understand ways to give position herself to give birth that decreases strain on the back and pelvis to decrease strain on lumbar    Baseline  not educated yet    Time  12    Period  Weeks    Status  New    Target Date  02/13/19             Plan  - 11/21/18 0842    Clinical Impression Statement  Patient is a 33 year old female who is [redacted] weeks pregnant and due on 02/09/19. Patient came in wearing a SI belt. Patient reports her lumbar, hip and pelvic pain level is 9/10 with movement, sit to stand, sitting, walking. Left ilium rotated posteriorly. Decreased movement of T11-L5. Left hip 4/5 strength. Difficulty with standing on left leg. Lumbar flexion and right sidebending decreased by 25%. Patient will benefit from skilled therapy to reduce her pain, educate on ADL's, improve strength and education on birthing positions to decrease strain on back and pelvis.    Personal Factors and Comorbidities  Comorbidity 1    Comorbidities  [redacted] weeks pregnant    Examination-Activity Limitations  Transfers;Bed Mobility;Sit;Stand    Stability/Clinical Decision Making  Evolving/Moderate complexity    Clinical Decision Making  Low    Rehab Potential  Excellent    PT Frequency  1x / week    PT Duration  12 weeks    PT Treatment/Interventions  Cryotherapy;Moist Heat;Therapeutic activities;Therapeutic exercise;Patient/family education;Neuromuscular re-education;Manual techniques;Passive range of motion;Spinal Manipulations;Taping    PT Next Visit Plan  correct pelvis, left hip strength, stretches for back, possible spinal mobilization, body mechanics    PT Home Exercise Plan  Access Code: [email or web address]    Consulted and Agree with Plan of Care  Patient       Patient will benefit from skilled therapeutic intervention in order to improve the following deficits and impairments:  Decreased range of motion, Increased fascial restricitons, Increased muscle spasms, Pain, Decreased mobility, Decreased strength  Visit Diagnosis: 1. Acute left-sided low back pain with left-sided sciatica   2. Muscle weakness (generalized)   3. Pregnant and not yet delivered in second trimester        Problem List Patient Active Problem List   Diagnosis Date Noted  . Supervision of  other normal pregnancy, antepartum 06/04/2014    Eulis Fosterheryl Kodie Kishi, PT 11/21/18 8:53 AM    [facility] [address] [place], [place], [address] Phone: [phone]   Fax:  [phone]  Name: Kelly Rush MRN: [id number] Date of Birth: 03/21/1986

## 2018-11-21 NOTE — Patient Instructions (Signed)
Access Code: [email or web address]  URL: [email or web address]  Date: 11/21/2018  Prepared by: Earlie Counts   Exercises Quadruped Cat with Posterior Pelvic Tilt - 10 reps - 1 sets - 1x daily - 7x weekly Standing Pelvic Tilt to Neutral Spine at Wall - 10 reps - 1 sets - 1x daily - 7x weekly Seated Pelvic Tilts - 10 reps - 1 sets - 1x daily - 7x weekly [facility] [address], [place], [place] [address] Phone # [phone] Fax [phone]

## 2018-12-03 ENCOUNTER — Ambulatory Visit: Payer: Medicaid Other | Attending: Obstetrics and Gynecology | Admitting: Physical Therapy

## 2018-12-03 DIAGNOSIS — M6281 Muscle weakness (generalized): Secondary | ICD-10-CM | POA: Insufficient documentation

## 2018-12-03 DIAGNOSIS — M5442 Lumbago with sciatica, left side: Secondary | ICD-10-CM | POA: Insufficient documentation

## 2018-12-03 DIAGNOSIS — Z3492 Encounter for supervision of normal pregnancy, unspecified, second trimester: Secondary | ICD-10-CM | POA: Insufficient documentation

## 2018-12-05 ENCOUNTER — Encounter: Payer: Medicaid Other | Admitting: Physical Therapy

## 2018-12-06 ENCOUNTER — Encounter: Payer: Self-pay | Admitting: Physical Therapy

## 2018-12-06 ENCOUNTER — Ambulatory Visit: Payer: Medicaid Other | Admitting: Physical Therapy

## 2018-12-06 ENCOUNTER — Other Ambulatory Visit: Payer: Self-pay

## 2018-12-06 DIAGNOSIS — M5442 Lumbago with sciatica, left side: Secondary | ICD-10-CM

## 2018-12-06 DIAGNOSIS — Z3492 Encounter for supervision of normal pregnancy, unspecified, second trimester: Secondary | ICD-10-CM | POA: Diagnosis present

## 2018-12-06 DIAGNOSIS — M6281 Muscle weakness (generalized): Secondary | ICD-10-CM

## 2018-12-06 NOTE — Patient Instructions (Signed)
Access Code: [email or web address]  URL: [email or web address]  Date: 12/06/2018  Prepared by: Earlie Counts   Exercises Quadruped Cat with Posterior Pelvic Tilt - 10 reps - 1 sets - 1x daily - 7x weekly Standing Pelvic Tilt to Neutral Spine at Wall - 10 reps - 1 sets - 1x daily - 7x weekly Seated Pelvic Tilts - 10 reps - 1 sets - 1x daily - 7x weekly Child's Pose Stretch - 1 reps - 1 sets - 30 sec hold - 1x daily - 7x weekly Childs Pose Side Bend - 1 reps - 1 sets - 30 sec hold - 1x daily - 7x weekly Seated Hip Flexor Stretch - 1 reps - 1 sets - 30 sec hold - 1x daily - 7x weekly Seated Hip Flexor Stretch - 1 reps - 1 sets - 30 sec hold - 1x daily - 7x weekly Seated Hamstring Stretch - 1 reps - 1 sets - 30 sec hold - 1x daily - 7x weekly Seated Piriformis Stretch - 1 reps - 1 sets - 30 sec hold - 1x daily - 7x weekly Seated Lumbar Flexion Stretch - 1 reps - 1 sets - 30 sec hold - 1x daily - 7x weekly Standing Thoracic Spine Stretch - 1 reps - 1 sets - 30 sec hold - 1x daily - 7x weekly Seated Thoracic Lumbar Extension with Pectoralis Stretch - 1 reps - 1 sets - 15 sec hold - 1x daily - 7x weekly Seated Trunk Rotation Stretch - 1 reps - 1 sets - 15 sec hold - 1x daily - 7x weekly Patient Education walking program [facility] [address] [place], [place] [address] Phone # [phone] Fax [phone]

## 2018-12-06 NOTE — Therapy (Signed)
[facility] [address] [place], [place], [address] Phone: [phone]   Fax:  [phone]  Physical Therapy Treatment  Patient Details  Name: Kelly Rush MRN: [id number] Date of Birth: [birth year] Referring Provider (PT): Dr. Mora Bellman   Encounter Date: 12/06/2018  PT End of Session - 12/06/18 0845    Visit Number  2    Date for PT Re-Evaluation  02/13/19    Authorization Type  Medicaid    Authorization Time Period  7/29-8/18    Authorization - Visit Number  2    Authorization - Number of Visits  4    PT Start Time  0800    PT Stop Time  0840    PT Time Calculation (min)  40 min    Activity Tolerance  Patient tolerated treatment well;No increased pain    Behavior During Therapy  [facility] for tasks assessed/performed       Past Medical History:  Diagnosis Date  . Anemia   . HSV (herpes simplex virus) infection     History reviewed. No pertinent surgical history.  There were no vitals filed for this visit.  Subjective Assessment - 12/06/18 0807    Subjective  The brace is helping me out. I am having groin pain. I felt alot better after last visit. The baby sunk down into the area after I was aligned. I have been able to do more things around the house.    Limitations  Standing;Sitting;Walking    How long can you sit comfortably?  30 min    How long can you stand comfortably?  30 min    How long can you walk comfortably?  15 min    Patient Stated Goals  Want to know how to manage pain and ease it    Currently in Pain?  Yes    Pain Score  7     Pain Location  Back   hips   Pain Orientation  Right;Left    Pain Descriptors / Indicators  Aching;Pressure    Pain Type  Acute pain    Pain Onset  More than a month ago    Pain Frequency  Intermittent    Aggravating Factors   standing, walking, movement, sitting    Pain Relieving Factors  massage, stand to sit, brace                        [facility] Adult PT Treatment/Exercise - 12/06/18 0001      Self-Care   Self-Care  Other Self-Care Comments    Other Self-Care Comments   instruction on walking program and how to progress herself      Therapeutic Activites    Therapeutic Activities  Other Therapeutic Activities    Other Therapeutic Activities  sleeping with a tube so her baby can be placed in to take pressure off her back      Lumbar Exercises: Stretches   Active Hamstring Stretch  Right;Left;1 rep;30 seconds   sitting   Single Knee to Chest Stretch  Right;Left;1 rep;30 seconds   sitting   Lower Trunk Rotation  2 reps;10 seconds   sitting   Quadruped Mid Back Stretch  3 reps;30 seconds   all 3 directions   Piriformis Stretch  Right;Left;1 rep;30 seconds   sitting   Other Lumbar Stretch Exercise  thoracic stretch and rotation in sitting     Other Lumbar Stretch Exercise  standing with hands on table to stretch  thoracic      Manual Therapy   Manual Therapy  Soft tissue mobilization    Soft tissue mobilization  prone with pillows to make space for the baby soft tissue work to lumbar and gluteals to elongate tissue and bil. iliopsoaso             PT Education - 12/06/18 0827    Education Details  Access Code: [email or web address]    Person(s) Educated  Patient    Methods  Explanation;Demonstration;Handout    Comprehension  Returned demonstration;Verbalized understanding       PT Short Term Goals - 11/21/18 0847      PT SHORT TERM GOAL #1   Title  independent with initial HEP    Baseline  not educated yet    Time  4    Period  Weeks    Status  New    Target Date  12/19/18        PT Long Term Goals - 11/21/18 0848      PT LONG TERM GOAL #1   Title  independent with HEP and understand how to progress herself    Baseline  not educated yet    Time  12    Period  Weeks    Status  New    Target Date  02/13/19      PT LONG TERM GOAL #2   Title  understand ways to manage her  pain with correct body mechanics with daily activities    Time  12    Period  Weeks    Status  New    Target Date  02/13/19      PT LONG TERM GOAL #3   Title  able to sit and stand for 25 to 30 minutes with moderate weight shifting due to pelvis in correct alignment and pain decreased >/= 4/10    Baseline  pain level 9/10, left ilium rotated posteriorly    Time  12    Period  Weeks    Status  New    Target Date  02/13/19      PT LONG TERM GOAL #4   Title  understand ways to give position herself to give birth that decreases strain on the back and pelvis to decrease strain on lumbar    Baseline  not educated yet    Time  12    Period  Weeks    Status  New    Target Date  02/13/19            Plan - 12/06/18 0810    Clinical Impression Statement  Patient is able to stand for 1.5 hours with weightshifting since last visit. After therapy pain decreased to 3/10. Patient understands stretches to assist in flexibility and pain. Patient understands ways to lay prone with weight off her abdomen using a tube or pillows. Patient has not met goals due to just starting. Patient will benefit from skilled therapy to reduce her pain, educate on ADL's, improve strength and educaton on birthing positions to decrease strain on bac kand pelvis.    Personal Factors and Comorbidities  Comorbidity 1    Comorbidities  [redacted] weeks pregnant    Examination-Activity Limitations  Transfers;Bed Mobility;Sit;Stand    Stability/Clinical Decision Making  Evolving/Moderate complexity    Rehab Potential  Excellent    PT Frequency  1x / week    PT Duration  12 weeks    PT Treatment/Interventions  Cryotherapy;Moist Heat;Therapeutic activities;Therapeutic exercise;Patient/family education;Neuromuscular re-education;Manual techniques;Passive range of  motion;Spinal Manipulations;Taping    PT Next Visit Plan  correct pelvis, left hip strength,  possible spinal mobilization, body mechanics    PT Home Exercise Plan  Access  Code: [email or web address]    Recommended Other Services  MD signed initial eval    Consulted and Agree with Plan of Care  Patient       Patient will benefit from skilled therapeutic intervention in order to improve the following deficits and impairments:  Decreased range of motion, Increased fascial restricitons, Increased muscle spasms, Pain, Decreased mobility, Decreased strength  Visit Diagnosis: 1. Acute left-sided low back pain with left-sided sciatica   2. Muscle weakness (generalized)   3. Pregnant and not yet delivered in second trimester        Problem List Patient Active Problem List   Diagnosis Date Noted  . Supervision of other normal pregnancy, antepartum 06/04/2014    Earlie Counts, PT 12/06/18 8:49 AM   [facility] [address] [place], [place], [address] Phone: [phone]   Fax:  [phone]  Name: Wally Behan MRN: [id number] Date of Birth: [birth year]

## 2018-12-10 ENCOUNTER — Encounter: Payer: Medicaid Other | Admitting: Physical Therapy

## 2018-12-10 ENCOUNTER — Ambulatory Visit (INDEPENDENT_AMBULATORY_CARE_PROVIDER_SITE_OTHER): Payer: Medicaid Other | Admitting: Obstetrics and Gynecology

## 2018-12-10 ENCOUNTER — Ambulatory Visit: Payer: Medicaid Other | Admitting: Physical Therapy

## 2018-12-10 VITALS — BP 120/70 | HR 92 | Wt 165.4 lb

## 2018-12-10 DIAGNOSIS — O99891 Other specified diseases and conditions complicating pregnancy: Secondary | ICD-10-CM | POA: Insufficient documentation

## 2018-12-10 DIAGNOSIS — M549 Dorsalgia, unspecified: Secondary | ICD-10-CM | POA: Insufficient documentation

## 2018-12-10 DIAGNOSIS — O9989 Other specified diseases and conditions complicating pregnancy, childbirth and the puerperium: Secondary | ICD-10-CM

## 2018-12-10 DIAGNOSIS — Z3480 Encounter for supervision of other normal pregnancy, unspecified trimester: Secondary | ICD-10-CM

## 2018-12-10 DIAGNOSIS — B009 Herpesviral infection, unspecified: Secondary | ICD-10-CM

## 2018-12-10 DIAGNOSIS — O26893 Other specified pregnancy related conditions, third trimester: Secondary | ICD-10-CM

## 2018-12-10 DIAGNOSIS — Z3A31 31 weeks gestation of pregnancy: Secondary | ICD-10-CM

## 2018-12-10 NOTE — Progress Notes (Signed)
   [place] VIRTUAL VIDEO VISIT ENCOUNTER NOTE  Provider location: [facility] [place] [facility] [place]   I connected with Kelly Rush on 12/10/18 at  9:45 AM EDT by WebEx Video Encounter at home and verified that I am speaking with the correct person using two identifiers.   I discussed the limitations, risks, security and privacy concerns of performing an evaluation and management service virtually and the availability of in person appointments. I also discussed with the patient that there may be a patient responsible charge related to this service. The patient expressed understanding and agreed to proceed. Subjective:  Kelly Rush is a 33 y.o. G3P2002 at [redacted]w[redacted]d being seen today for ongoing prenatal care.  She is currently monitored for the following issues for this low-risk pregnancy and has Supervision of other normal pregnancy, antepartum; HSV (herpes simplex virus) infection; and Back pain affecting pregnancy on their problem list.  Patient reports backache.  Contractions: Irregular. Vag. Bleeding: None.  Movement: Present. Denies any leaking of fluid.   The following portions of the patient's history were reviewed and updated as appropriate: allergies, current medications, past family history, past medical history, past social history, past surgical history and problem list.   Objective:   Vitals:   12/10/18 1008  BP: 120/70  Pulse: 92  Weight: 165 lb 6.4 oz (75 kg)    Fetal Status:     Movement: Present     General:  Alert, oriented and cooperative. Patient is in no acute distress.  Respiratory: Normal respiratory effort, no problems with respiration noted  Mental Status: Normal mood and affect. Normal behavior. Normal judgment and thought content.  Rest of physical exam deferred due to type of encounter  Imaging: No results found.  Assessment and Plan:  Pregnancy: G3P2002 at [redacted]w[redacted]d  1. HSV (herpes simplex virus) infection To start valtrex 34  weeks, previously sent  2. 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 Back pain affecting pregnancy in third trimester Has been going to physical therapy for her hips and states it has helped quite a bit, although she recently has been having increased pain in her back  Preterm labor symptoms and general obstetric precautions including but not limited to vaginal bleeding, contractions, leaking of fluid and fetal movement were reviewed in detail with the patient. I discussed the assessment and treatment plan with the patient. The patient was provided an opportunity to ask questions and all were answered. The patient agreed with the plan and demonstrated an understanding of the instructions. The patient was advised to call back or seek an in-person office evaluation/go to MAU at [facility] for any urgent or concerning symptoms. Please refer to After Visit Summary for other counseling recommendations.   I provided 15 minutes of face-to-face time during this encounter.  No follow-ups on file.  Future Appointments  Date Time Provider [place]  12/10/2018  3:30 PM Sharion Dove [facility]  12/17/2018  3:30 PM Altamese Dilling, PTA [facility]    Sloan Leiter, [place] [facility], [place]

## 2018-12-10 NOTE — Progress Notes (Signed)
Pt states she is having increase in pelvic pressure, back and round ligament pain.  Pt is currently in PT for this problem.  Pt is using support belt as well.  Pt is having some ctx that are irregular.

## 2018-12-12 ENCOUNTER — Encounter: Payer: Self-pay | Admitting: Physical Therapy

## 2018-12-12 ENCOUNTER — Ambulatory Visit: Payer: Medicaid Other | Admitting: Physical Therapy

## 2018-12-12 ENCOUNTER — Other Ambulatory Visit: Payer: Self-pay

## 2018-12-12 DIAGNOSIS — Z3492 Encounter for supervision of normal pregnancy, unspecified, second trimester: Secondary | ICD-10-CM

## 2018-12-12 DIAGNOSIS — M6281 Muscle weakness (generalized): Secondary | ICD-10-CM

## 2018-12-12 DIAGNOSIS — M5442 Lumbago with sciatica, left side: Secondary | ICD-10-CM

## 2018-12-12 NOTE — Therapy (Signed)
[facility] [address], [address] [place], [place], [address] Phone: [phone]   Fax:  [phone]  Physical Therapy Treatment  Patient Details  Name: Kelly Rush MRN: [id number] Date of Birth: [birth year] Referring Provider (PT): Dr. Catalina AntiguaPeggy Constant   Encounter Date: 12/12/2018  PT End of Session - 12/12/18 0853    Visit Number  3    Date for PT Re-Evaluation  02/13/19    Authorization Type  Medicaid    Authorization Time Period  7/29-8/18    Authorization - Visit Number  3    Authorization - Number of Visits  4    PT Start Time  0853    PT Stop Time  0931    PT Time Calculation (min)  38 min    Activity Tolerance  Patient tolerated treatment well    Behavior During Therapy  [facility] for tasks assessed/performed       Past Medical History:  Diagnosis Date  . Anemia   . HSV (herpes simplex virus) infection     History reviewed. No pertinent surgical history.  There were no vitals filed for this visit.  Subjective Assessment - 12/12/18 0857    Subjective  Pt felt great after her last session but then was on her feet alot and that made her back hurt for the next 24 hrs.    Currently in Pain?  No/denies                       [facility] PT Treatment/Exercise - 12/12/18 0001      Lumbar Exercises: Stretches   Pelvic Tilt  --   Against the wall 10x 3-5 sec hold   Prone Mid Back Stretch  --   Modified childs pose at counter, added TA contractions 6x     Lumbar Exercises: Quadruped   Madcat/Old Horse  10 reps    Other Quadruped Lumbar Exercises  Modified childs pose with pillows for resting position after cat/cow exercise      Manual Therapy   Manual Therapy  Soft tissue mobilization    Soft tissue mobilization  Lt paraspinals, flank and into gluteals                 PT Short Term Goals - 11/21/18 0847      PT SHORT TERM GOAL #1   Title  independent with initial HEP    Baseline  not  educated yet    Time  4    Period  Weeks    Status  New    Target Date  12/19/18        PT Long Term Goals - 11/21/18 0848      PT LONG TERM GOAL #1   Title  independent with HEP and understand how to progress herself    Baseline  not educated yet    Time  12    Period  Weeks    Status  New    Target Date  02/13/19      PT LONG TERM GOAL #2   Title  understand ways to manage her pain with correct body mechanics with daily activities    Time  12    Period  Weeks    Status  New    Target Date  02/13/19      PT LONG TERM GOAL #3   Title  able to sit and stand for 25 to 30 minutes with moderate weight shifting due to  pelvis in correct alignment and pain decreased >/= 4/10    Baseline  pain level 9/10, left ilium rotated posteriorly    Time  12    Period  Weeks    Status  New    Target Date  02/13/19      PT LONG TERM GOAL #4   Title  understand ways to give position herself to give birth that decreases strain on the back and pelvis to decrease strain on lumbar    Baseline  not educated yet    Time  12    Period  Weeks    Status  New    Target Date  02/13/19            Plan - 12/12/18 0854    Clinical Impression Statement  Typically pt feels great when she leaves her therapy session, but if she has anything she needs to do that requires standing on her feet her back typically begins to hurt. th elonger she stands, the more her back hurts. PTA instructed pt in how to use the wall to rest her back intermittently as well as other positions that will take the weight of the baby off her back. We added some basic Lt hip strength to her HEP today ( isometric hip abd seated did not print out) to aide in her hip stability. Pelvis appeared aligned today. Most likely why pt did not have pain today.    Personal Factors and Comorbidities  Comorbidity 1    Comorbidities  [redacted] weeks pregnant    Examination-Activity Limitations  Transfers;Bed Mobility;Sit;Stand    Stability/Clinical  Decision Making  Evolving/Moderate complexity    Rehab Potential  Excellent    PT Frequency  1x / week    PT Duration  12 weeks    PT Treatment/Interventions  Cryotherapy;Moist Heat;Therapeutic activities;Therapeutic exercise;Patient/family education;Neuromuscular re-education;Manual techniques;Passive range of motion;Spinal Manipulations;Taping    PT Next Visit Plan  correct pelvis, left hip strength,  possible spinal mobilization, body mechanics    PT Home Exercise Plan  Access Code: [email or web address]    Consulted and Agree with Plan of Care  Patient       Patient will benefit from skilled therapeutic intervention in order to improve the following deficits and impairments:  Decreased range of motion, Increased fascial restricitons, Increased muscle spasms, Pain, Decreased mobility, Decreased strength  Visit Diagnosis: 1. Acute left-sided low back pain with left-sided sciatica   2. Muscle weakness (generalized)   3. Pregnant and not yet delivered in second trimester        Problem List Patient Active Problem List   Diagnosis Date Noted  . HSV (herpes simplex virus) infection   . Back pain affecting pregnancy   . Supervision of other normal pregnancy, antepartum 06/04/2014    Jade Burright, PTA 12/12/2018, 1:59 PM  [facility] [address], [place], [place], [address] Phone: [phone]   Fax:  [phone]  Name: Kelly Rush MRN: [id number] Date of Birth: 19-Feb-1986

## 2018-12-17 ENCOUNTER — Other Ambulatory Visit: Payer: Self-pay

## 2018-12-17 ENCOUNTER — Encounter: Payer: Self-pay | Admitting: Physical Therapy

## 2018-12-17 ENCOUNTER — Ambulatory Visit: Payer: Medicaid Other | Admitting: Physical Therapy

## 2018-12-17 ENCOUNTER — Encounter: Payer: Medicaid Other | Admitting: Physical Therapy

## 2018-12-17 DIAGNOSIS — M5442 Lumbago with sciatica, left side: Secondary | ICD-10-CM

## 2018-12-17 DIAGNOSIS — Z3492 Encounter for supervision of normal pregnancy, unspecified, second trimester: Secondary | ICD-10-CM

## 2018-12-17 DIAGNOSIS — M6281 Muscle weakness (generalized): Secondary | ICD-10-CM

## 2018-12-17 NOTE — Therapy (Signed)
[facility] [address], [address] [place], [place], [address] Phone: [phone]   Fax:  [phone]  Physical Therapy Treatment  Patient Details  Name: Kelly Rush MRN: [id number] Date of Birth: 07/25/1985 Referring Provider (PT): Dr. Catalina AntiguaPeggy Constant   Encounter Date: 12/17/2018  PT End of Session - 12/17/18 1538    Visit Number  4    Date for PT Re-Evaluation  02/13/19    Authorization Type  Medicaid    Authorization Time Period  7/29-8/18    Authorization - Visit Number  4    Authorization - Number of Visits  4    PT Start Time  1536    PT Stop Time  1612    PT Time Calculation (min)  36 min    Activity Tolerance  Patient tolerated treatment well    Behavior During Therapy  [facility] for tasks assessed/performed       Past Medical History:  Diagnosis Date  . Anemia   . HSV (herpes simplex virus) infection     History reviewed. No pertinent surgical history.  There were no vitals filed for this visit.  Subjective Assessment - 12/17/18 1554    Subjective  I am doing good today. Night time is difficult on my hips when I lay down. pillows between my legs helps some.    Currently in Pain?  No/denies   Mainly gets more pain when she is on her feet alot.   Pain Onset  More than a month ago    Aggravating Factors   Laying on her sides at night.    Pain Relieving Factors  Soft tissue work, her belly band, changing positions    Multiple Pain Sites  No         [facility] PT Assessment - 12/17/18 0001      Assessment   Medical Diagnosis  Z34.80 Supervision of other normal pregnancy antepartum; M54.32 Sciatic nerve pain, left    Referring Provider (PT)  Dr. Gigi GinPeggy Constant    Onset Date/Surgical Date  08/01/18   due date 02/09/2019     AROM   Lumbar Flexion  25% limited    Lumbar - Right Side Bend  WNL and no pain      Strength   Left Hip ABduction  4+/5   painful lateral hip   Left Hip ADduction  --   PT cannot  lifting her LTLE into adduction: painful pelvis                  [facility] Adult PT Treatment/Exercise - 12/17/18 0001      Manual Therapy   Manual Therapy  Soft tissue mobilization    Soft tissue mobilization  Lt paraspinals, flank, QL, lateral LT hip and into gluteals                 PT Short Term Goals - 12/17/18 1550      PT SHORT TERM GOAL #1   Title  independent with initial HEP    Time  4    Period  Weeks    Status  Achieved        PT Long Term Goals - 12/17/18 1550      PT LONG TERM GOAL #1   Title  independent with HEP and understand how to progress herself    Time  12    Period  Weeks    Status  On-going   Still working on     PT LONG  TERM GOAL #2   Title  understand ways to manage her pain with correct body mechanics with daily activities    Time  12    Period  Weeks    Status  Achieved      PT LONG TERM GOAL #3   Title  able to sit and stand for 25 to 30 minutes with moderate weight shifting due to pelvis in correct alignment and pain decreased >/= 4/10    Time  12    Period  Weeks    Status  Achieved   30-45 min range with shifting           Plan - 12/17/18 1549    Clinical Impression Statement  Pt reports she is doing better overall. Her pain is better and can now sit for 30-45 minutes with shifing before needing to stand up and move around due to discomfort. She is independent her HEP and we continue to progress her exercises. Pt remains weak in her left hip adductors in a long lever position. She could not lift her leg off the bed today for a MMT because this caused too much pain in her pelvis. She did score better on her Lt hip abduction, 4+/5 but this also produced pain around the greater trochanter. Soft tissues remians tight along the left lateral lumbar and down into her hip and proximal glutes. Soft tissue work helps loosen them for at least a day, longer if she doesn't have to be on her feet too long.    Personal Factors and  Comorbidities  Comorbidity 1    Comorbidities  [redacted] weeks pregnant    Examination-Activity Limitations  Transfers;Bed Mobility;Sit;Stand    Stability/Clinical Decision Making  Evolving/Moderate complexity    Rehab Potential  Excellent    PT Frequency  1x / week    PT Duration  12 weeks    PT Treatment/Interventions  Cryotherapy;Moist Heat;Therapeutic activities;Therapeutic exercise;Patient/family education;Neuromuscular re-education;Manual techniques;Passive range of motion;Spinal Manipulations;Taping    PT Next Visit Plan  Send Medicaid info for further auth: educate pt on lumbar protective positions for labor and delivery, soft tissue work LT lumbar and hip    PT Home Exercise Plan  Access Code: [email or web address]       Patient will benefit from skilled therapeutic intervention in order to improve the following deficits and impairments:  Decreased range of motion, Increased fascial restricitons, Increased muscle spasms, Pain, Decreased mobility, Decreased strength  Visit Diagnosis: 1. Acute left-sided low back pain with left-sided sciatica   2. Muscle weakness (generalized)   3. Pregnant and not yet delivered in second trimester        Problem List Patient Active Problem List   Diagnosis Date Noted  . HSV (herpes simplex virus) infection   . Back pain affecting pregnancy   . Supervision of other normal pregnancy, antepartum 06/04/2014    Myrene Galas, PTA 12/17/18 4:13 PM  [facility] [address] [place], [place], [address] Phone: [phone]   Fax:  [phone]  Name: Kelly Rush MRN: [id number] Date of Birth: 1986-02-02

## 2018-12-24 ENCOUNTER — Encounter: Payer: Self-pay | Admitting: Obstetrics

## 2018-12-24 ENCOUNTER — Other Ambulatory Visit: Payer: Self-pay

## 2018-12-24 ENCOUNTER — Encounter: Payer: Self-pay | Admitting: Physical Therapy

## 2018-12-24 ENCOUNTER — Ambulatory Visit (INDEPENDENT_AMBULATORY_CARE_PROVIDER_SITE_OTHER): Payer: Medicaid Other | Admitting: Obstetrics

## 2018-12-24 ENCOUNTER — Ambulatory Visit: Payer: Medicaid Other | Admitting: Physical Therapy

## 2018-12-24 VITALS — BP 106/71 | HR 95

## 2018-12-24 DIAGNOSIS — Z3480 Encounter for supervision of other normal pregnancy, unspecified trimester: Secondary | ICD-10-CM

## 2018-12-24 DIAGNOSIS — M6281 Muscle weakness (generalized): Secondary | ICD-10-CM

## 2018-12-24 DIAGNOSIS — Z3492 Encounter for supervision of normal pregnancy, unspecified, second trimester: Secondary | ICD-10-CM

## 2018-12-24 DIAGNOSIS — M5442 Lumbago with sciatica, left side: Secondary | ICD-10-CM

## 2018-12-24 DIAGNOSIS — Z3A33 33 weeks gestation of pregnancy: Secondary | ICD-10-CM

## 2018-12-24 DIAGNOSIS — Z3483 Encounter for supervision of other normal pregnancy, third trimester: Secondary | ICD-10-CM

## 2018-12-24 NOTE — Therapy (Signed)
[facility] [address] [place], [place], [address] Phone: [phone]   Fax:  [phone]  Physical Therapy Treatment  Patient Details  Name: Kelly Rush MRN: [id number] Date of Birth: December 17, 1985 Referring Provider (PT): Dr. Mora Bellman   Encounter Date: 12/24/2018  PT End of Session - 12/24/18 1603    Visit Number  5    Date for PT Re-Evaluation  02/13/19    Authorization Type  Medicaid    Authorization Time Period  7/29-8/18    Authorization - Visit Number  5    Authorization - Number of Visits  12    PT Start Time  3875   pt late   PT Stop Time  1615    PT Time Calculation (min)  36 min    Activity Tolerance  Patient tolerated treatment well    Behavior During Therapy  [facility] for tasks assessed/performed       Past Medical History:  Diagnosis Date  . Anemia   . HSV (herpes simplex virus) infection     History reviewed. No pertinent surgical history.  There were no vitals filed for this visit.  Subjective Assessment - 12/24/18 1605    Subjective  My hip is doing much better, less pain. I cannot sit for long periods now, I just get uncomfortable and need to change my position. Stressful weekend as her son had seizure and they had to deal with that. pt presents tired today.    Limitations  Standing;Sitting;Walking    Currently in Pain?  --   No pain but RT buttocks feels tight   Multiple Pain Sites  No                       [facility] Adult PT Treatment/Exercise - 12/24/18 0001      Moist Heat Therapy   Number Minutes Moist Heat  10 Minutes    Moist Heat Location  --   Rt buttocks     Manual Therapy   Manual Therapy  Soft tissue mobilization    Soft tissue mobilization  Lt paraspinals, flank, QL, lateral LT hip and gluteals/piriformis                 PT Short Term Goals - 12/17/18 1550      PT SHORT TERM GOAL #1   Title  independent with initial HEP    Time  4     Period  Weeks    Status  Achieved        PT Long Term Goals - 12/17/18 1550      PT LONG TERM GOAL #1   Title  independent with HEP and understand how to progress herself    Time  12    Period  Weeks    Status  On-going   Still working on     PT LONG TERM GOAL #2   Title  understand ways to manage her pain with correct body mechanics with daily activities    Time  12    Period  Weeks    Status  Achieved      PT LONG TERM GOAL #3   Title  able to sit and stand for 25 to 30 minutes with moderate weight shifting due to pelvis in correct alignment and pain decreased >/= 4/10    Time  12    Period  Weeks    Status  Achieved   30-45 min range with shifting  Plan - 12/24/18 1604    Clinical Impression Statement  Pt hip pain much better last week despite not being able to sit as long due to an overall back discomfort. Her LT flank/QL area feels more supple the piriformis was tender and thick today. Pt requested that area to be worked on more. 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    Personal Factors and Comorbidities  Comorbidity 1    Comorbidities  [redacted] weeks pregnant    Examination-Activity Limitations  Transfers;Bed Mobility;Sit;Stand    Stability/Clinical Decision Making  Evolving/Moderate complexity    Rehab Potential  Excellent    PT Frequency  1x / week    PT Duration  12 weeks    PT Treatment/Interventions  Cryotherapy;Moist Heat;Therapeutic activities;Therapeutic exercise;Patient/family education;Neuromuscular re-education;Manual techniques;Passive range of motion;Spinal Manipulations;Taping    PT Next Visit Plan  Educate on labor positions next, cont soft tissue work, look at updating HEP in next 1-2 visits.    PT Home Exercise Plan  Access Code: [email or web address]    Consulted and Agree with Plan of Care  Patient        Patient will benefit from skilled therapeutic intervention in order to improve the following deficits and impairments:  Decreased range of motion, Increased fascial restricitons, Increased muscle spasms, Pain, Decreased mobility, Decreased strength  Visit Diagnosis: Acute left-sided low back pain with left-sided sciatica  Muscle weakness (generalized)  Pregnant and not yet delivered in second trimester     Problem List Patient Active Problem List   Diagnosis Date Noted  . HSV (herpes simplex virus) infection   . Back pain affecting pregnancy   . Supervision of other normal pregnancy, antepartum 06/04/2014    Michio Thier, PTA 12/24/2018, 4:12 PM  [facility] [address], [address] [place], [place], [address] Phone: [phone]   Fax:  [phone]  Name: Kelly Rush MRN: [id number] Date of Birth: [birth year]

## 2018-12-24 NOTE — Progress Notes (Signed)
Pt is on the phone preparing for virtual visit with provider. [redacted]w[redacted]d.  

## 2018-12-24 NOTE — Progress Notes (Signed)
   [place] VIRTUAL VIDEO VISIT ENCOUNTER NOTE  Provider location: [facility] [place] [facility] [place]   I connected with Kelly Rush on 12/24/18 at  9:15 AM EDT by WebEx OB MyChart Video Encounter at home and verified that I am speaking with the correct person using two identifiers.   I discussed the limitations, risks, security and privacy concerns of performing an evaluation and management service virtually and the availability of in person appointments. I also discussed with the patient that there may be a patient responsible charge related to this service. The patient expressed understanding and agreed to proceed. Subjective:  Kelly Rush is a 33 y.o. G3P2002 at [redacted]w[redacted]d being seen today for ongoing prenatal care.  She is currently monitored for the following issues for this low-risk pregnancy and has Supervision of other normal pregnancy, antepartum; HSV (herpes simplex virus) infection; and Back pain affecting pregnancy on their problem list.  Patient reports no complaints.  Contractions: Irritability. Vag. Bleeding: None.  Movement: Present. Denies any leaking of fluid.   The following portions of the patient's history were reviewed and updated as appropriate: allergies, current medications, past family history, past medical history, past social history, past surgical history and problem list.   Objective:   Vitals:   12/24/18 0928  BP: 106/71  Pulse: 95    Fetal Status:     Movement: Present     General:  Alert, oriented and cooperative. Patient is in no acute distress.  Respiratory: Normal respiratory effort, no problems with respiration noted  Mental Status: Normal mood and affect. Normal behavior. Normal judgment and thought content.  Rest of physical exam deferred due to type of encounter  Imaging: No results found.  Assessment and Plan:  Pregnancy: G3P2002 at [redacted]w[redacted]d 1. Supervision of other normal pregnancy, antepartum   Preterm labor  symptoms and general obstetric precautions including but not limited to vaginal bleeding, contractions, leaking of fluid and fetal movement were reviewed in detail with the patient. I discussed the assessment and treatment plan with the patient. The patient was provided an opportunity to ask questions and all were answered. The patient agreed with the plan and demonstrated an understanding of the instructions. The patient was advised to call back or seek an in-person office evaluation/go to [facility] at [facility] for any urgent or concerning symptoms. Please refer to After Visit Summary for other counseling recommendations.   I provided 10 minutes of face-to-face time during this encounter.  Return in about 2 weeks (around 01/07/2019) for WebEx.  Future Appointments  Date Time Provider [place]  12/24/2018  3:30 PM Altamese Dilling, [place] [facility]  12/31/2018  3:30 PM Altamese Dilling, PTA [facility]  01/09/2019  3:45 PM Monico Hoar, PT [facility]  01/14/2019  3:30 PM Altamese Dilling, PTA [facility]  01/21/2019  3:30 PM Altamese Dilling, PTA [facility]  01/28/2019  3:30 PM Altamese Dilling, PTA [facility]    Baltazar Najjar, [place] for [facility], [place] .12/24/2018

## 2018-12-31 ENCOUNTER — Ambulatory Visit: Payer: Medicaid Other | Admitting: Physical Therapy

## 2019-01-01 ENCOUNTER — Ambulatory Visit: Payer: Medicaid Other | Attending: Obstetrics and Gynecology | Admitting: Physical Therapy

## 2019-01-01 ENCOUNTER — Other Ambulatory Visit: Payer: Self-pay

## 2019-01-01 ENCOUNTER — Encounter: Payer: Self-pay | Admitting: Physical Therapy

## 2019-01-01 DIAGNOSIS — Z3492 Encounter for supervision of normal pregnancy, unspecified, second trimester: Secondary | ICD-10-CM | POA: Insufficient documentation

## 2019-01-01 DIAGNOSIS — M5442 Lumbago with sciatica, left side: Secondary | ICD-10-CM | POA: Diagnosis present

## 2019-01-01 DIAGNOSIS — M6281 Muscle weakness (generalized): Secondary | ICD-10-CM

## 2019-01-01 NOTE — Therapy (Signed)
[facility] [address] [place], [place], [address] Phone: [phone]   Fax:  [phone]  Physical Therapy Treatment  Patient Details  Name: Kelly Rush MRN: [id number] Date of Birth: 07-12-1985 Referring Provider (PT): Dr. Catalina Antigua   Encounter Date: 01/01/2019  PT End of Session - 01/01/19 1158    Visit Number  6    Date for PT Re-Evaluation  02/13/19    Authorization Type  Medicaid    Authorization Time Period  12/24/18 to 03/17/2019    Authorization - Visit Number  2    Authorization - Number of Visits  12    PT Start Time  1150    PT Stop Time  1230    PT Time Calculation (min)  40 min    Activity Tolerance  Patient tolerated treatment well    Behavior During Therapy  [facility] for tasks assessed/performed       Past Medical History:  Diagnosis Date  . Anemia   . HSV (herpes simplex virus) infection     History reviewed. No pertinent surgical history.  There were no vitals filed for this visit.  Subjective Assessment - 01/01/19 1155    Subjective  I am tired. My left hip is starting to hurt a little bit more. I have had lower back pain. I have lots of pressure. I do not see the doctor for 1-2 weeks.    Limitations  Standing;Sitting;Walking    How long can you sit comfortably?  30 min    How long can you stand comfortably?  30 min    Patient Stated Goals  Want to know how to manage pain and ease it    Currently in Pain?  Yes    Pain Score  7     Pain Location  Back    Pain Orientation  Lower    Pain Descriptors / Indicators  Aching;Pressure    Pain Type  Acute pain    Pain Onset  More than a month ago    Pain Frequency  Intermittent    Aggravating Factors   standing, sitting too long    Pain Relieving Factors  stretches, laying down to relax    Multiple Pain Sites  No                       [facility] Adult PT Treatment/Exercise - 01/01/19 0001      Self-Care   Self-Care   Other Self-Care Comments    Other Self-Care Comments   education on different birthing positions to assist her with back pain and instagram site with the information on it      Lumbar Exercises: Standing   Other Standing Lumbar Exercises  stretch strap on lat bar and working on birthing positions using a strap      Lumbar Exercises: Seated   Other Seated Lumbar Exercises  sit on red physioball and pelvic tile, pelvic sway, pelvic circles, pelvic figure eight and bouncing      Lumbar Exercises: Quadruped   [place]  10 reps    Other Quadruped Lumbar Exercises  quadruped rocking back and forth; quadruped move knees in and out to open the outlet and reduce pressure    Other Quadruped Lumbar Exercises  quadruped with upper body on physioball and rock forward/backward then side to side      Manual Therapy   Manual Therapy  Soft tissue mobilization    Soft tissue mobilization  in right sidely work on left lumbar paraspinals and  SI joint             PT Education - 01/01/19 1327    Education Details  instruction on ways to give birth to reduce strain on pelvis and back    Person(s) Educated  Patient    Methods  Explanation;Demonstration    Comprehension  Verbalized understanding;Returned demonstration       PT Short Term Goals - 12/17/18 1550      PT SHORT TERM GOAL #1   Title  independent with initial HEP    Time  4    Period  Weeks    Status  Achieved        PT Long Term Goals - 12/17/18 1550      PT LONG TERM GOAL #1   Title  independent with HEP and understand how to progress herself    Time  12    Period  Weeks    Status  On-going   Still working on     [place] #2   Title  understand ways to manage her pain with correct body mechanics with daily activities    Time  12    Period  Weeks    Status  Achieved      PT LONG TERM GOAL #3   Title  able to sit and stand for 25 to 30 minutes with moderate weight shifting due to pelvis in correct  alignment and pain decreased >/= 4/10    Time  12    Period  Weeks    Status  Achieved   30-45 min range with shifting           Plan - 01/01/19 1200    Clinical Impression Statement  Patient baby had dropped and carrying very low with increased pelvic pressure. Patient has learned ways to have to give birth to reduce pressure on the pelvic area and back. Patient was not able to do deep squats due to increased pelvic pressure. Patient was feeling very tired today. Patient has less pressure with the exercises and using the physioball. Patient had tightness in the left lumbar and SI joint. Patient felt better after therapy. Patient will benefit from skilled therapy to reduce discomfort and work on birthing positions.    Comorbidities  [redacted] weeks pregnant    Examination-Activity Limitations  Transfers;Bed Mobility;Sit;Stand    Stability/Clinical Decision Making  Evolving/Moderate complexity    Rehab Potential  Excellent    PT Frequency  1x / week    PT Duration  12 weeks    PT Treatment/Interventions  Cryotherapy;Moist Heat;Therapeutic activities;Therapeutic exercise;Patient/family education;Neuromuscular re-education;Manual techniques;Passive range of motion;Spinal Manipulations;Taping    PT Next Visit Plan  Educate on labor positions next, cont soft tissue work, look at updating HEP in next 1-2 visits.    PT Home Exercise Plan  Access Code: [email or web address]    Consulted and Agree with Plan of Care  Patient       Patient will benefit from skilled therapeutic intervention in order to improve the following deficits and impairments:  Decreased range of motion, Increased fascial restricitons, Increased muscle spasms, Pain, Decreased mobility, Decreased strength  Visit Diagnosis: Acute left-sided low back pain with left-sided sciatica  Muscle weakness (generalized)  Pregnant and not yet delivered in second trimester     Problem List Patient Active Problem List   Diagnosis Date Noted  . HSV  (herpes simplex virus) infection   . Back  pain affecting pregnancy   . Supervision of other normal pregnancy, antepartum 06/04/2014    Eulis Fosterheryl Gray, PT 01/01/19 1:34 PM   [facility] [address] [place], [place], [address] Phone: [phone]   Fax:  [phone]  Name: Lillie ColumbiaMichelle Tremaine MRN: [id number] Date of Birth: 1985-10-14

## 2019-01-08 ENCOUNTER — Encounter: Payer: Self-pay | Admitting: Obstetrics

## 2019-01-08 ENCOUNTER — Telehealth (INDEPENDENT_AMBULATORY_CARE_PROVIDER_SITE_OTHER): Payer: Medicaid Other | Admitting: Obstetrics

## 2019-01-08 DIAGNOSIS — Z3A35 35 weeks gestation of pregnancy: Secondary | ICD-10-CM

## 2019-01-08 DIAGNOSIS — O98319 Other infections with a predominantly sexual mode of transmission complicating pregnancy, unspecified trimester: Secondary | ICD-10-CM

## 2019-01-08 DIAGNOSIS — A6009 Herpesviral infection of other urogenital tract: Secondary | ICD-10-CM

## 2019-01-08 DIAGNOSIS — Z3480 Encounter for supervision of other normal pregnancy, unspecified trimester: Secondary | ICD-10-CM

## 2019-01-08 NOTE — Progress Notes (Signed)
Virtual ROB pt has been able to take B/P at home has been WNL.  C/O : Pressure.  B/P: 109/70 P: 86

## 2019-01-08 NOTE — Progress Notes (Signed)
   [place] VIRTUAL VIDEO VISIT ENCOUNTER NOTE  Provider location: [facility] [place] [facility] [place]   I connected with Kelly Rush on 01/08/19 at  9:30 AM EDT by WebEx OB Video Encounter at home and verified that I am speaking with the correct person using two identifiers.   I discussed the limitations, risks, security and privacy concerns of performing an evaluation and management service virtually and the availability of in person appointments. I also discussed with the patient that there may be a patient responsible charge related to this service. The patient expressed understanding and agreed to proceed. Subjective:  Kelly Rush is a 33 y.o. G3P2002 at [redacted]w[redacted]d being seen today for ongoing prenatal care.  She is currently monitored for the following issues for this low-risk pregnancy and has Supervision of other normal pregnancy, antepartum; HSV (herpes simplex virus) infection; and Back pain affecting pregnancy on their problem list.  Patient reports pelvic pressure.  Contractions: Irritability. Vag. Bleeding: None.  Movement: Present. Denies any leaking of fluid.   The following portions of the patient's history were reviewed and updated as appropriate: allergies, current medications, past family history, past medical history, past social history, past surgical history and problem list.   Objective:  There were no vitals filed for this visit.  Fetal Status:     Movement: Present     General:  Alert, oriented and cooperative. Patient is in no acute distress.  Respiratory: Normal respiratory effort, no problems with respiration noted  Mental Status: Normal mood and affect. Normal behavior. Normal judgment and thought content.  Rest of physical exam deferred due to type of encounter  Imaging: No results found.  Assessment and Plan:  Pregnancy: G3P2002 at [redacted]w[redacted]d 1. Supervision of other normal pregnancy, antepartum  2. Genital herpes affecting  pregnancy, antepartum - taking Valtrex suppression  Preterm labor symptoms and general obstetric precautions including but not limited to vaginal bleeding, contractions, leaking of fluid and fetal movement were reviewed in detail with the patient. I discussed the assessment and treatment plan with the patient. The patient was provided an opportunity to ask questions and all were answered. The patient agreed with the plan and demonstrated an understanding of the instructions. The patient was advised to call back or seek an in-person office evaluation/go to [facility] at [facility] for any urgent or concerning symptoms. Please refer to After Visit Summary for other counseling recommendations.   I provided 10 minutes of face-to-face time during this encounter.  Return in about 1 week (around 01/15/2019) for ROB in person.  GBS..  Future Appointments  Date Time Provider [place]  01/09/2019  3:45 PM Monico Hoar, PT [facility]  01/14/2019  3:30 PM Altamese Dilling, PTA [facility]  01/21/2019  3:30 PM Altamese Dilling, PTA [facility]  01/28/2019  3:30 PM Altamese Dilling, PTA [facility]  02/04/2019  3:30 PM Monico Hoar, PT [facility]    Baltazar Najjar, [place] for [facility] [place] [facility] 01/08/2019

## 2019-01-09 ENCOUNTER — Ambulatory Visit: Payer: Medicaid Other | Admitting: Physical Therapy

## 2019-01-09 ENCOUNTER — Other Ambulatory Visit: Payer: Self-pay

## 2019-01-09 ENCOUNTER — Encounter: Payer: Self-pay | Admitting: Physical Therapy

## 2019-01-09 DIAGNOSIS — M5442 Lumbago with sciatica, left side: Secondary | ICD-10-CM

## 2019-01-09 DIAGNOSIS — M6281 Muscle weakness (generalized): Secondary | ICD-10-CM

## 2019-01-09 DIAGNOSIS — Z3492 Encounter for supervision of normal pregnancy, unspecified, second trimester: Secondary | ICD-10-CM

## 2019-01-09 NOTE — Therapy (Signed)
[facility] [address] [place], [place], [address] Phone: [phone]   Fax:  [phone]  Physical Therapy Treatment  Patient Details  Name: Kelly Rush MRN: [id number] Date of Birth: June 14, 1985 Referring Provider (PT): Dr. Mora Bellman   Encounter Date: 01/09/2019  PT End of Session - 01/09/19 1626    Visit Number  7    Date for PT Re-Evaluation  02/13/19    Authorization Type  Medicaid    Authorization Time Period  12/24/18 to 03/17/2019    Authorization - Visit Number  3    Authorization - Number of Visits  12    PT Start Time  2706    PT Stop Time  1526    PT Time Calculation (min)  41 min    Activity Tolerance  Patient tolerated treatment well    Behavior During Therapy  [facility] for tasks assessed/performed       Past Medical History:  Diagnosis Date  . Anemia   . HSV (herpes simplex virus) infection     History reviewed. No pertinent surgical history.  There were no vitals filed for this visit.  Subjective Assessment - 01/09/19 1554    Subjective  MD will see me next week. I am tired. I have alot of pelvic pain and pressure. I have been havng Braxton hicks. Today I had my first contraction.    Limitations  Standing;Sitting;Walking    How long can you sit comfortably?  30 min    How long can you stand comfortably?  30 min    How long can you walk comfortably?  15 min    Patient Stated Goals  Want to know how to manage pain and ease it    Currently in Pain?  Yes    Pain Score  6     Pain Location  Back    Pain Orientation  Lower    Pain Descriptors / Indicators  Aching;Pressure    Pain Type  Acute pain    Pain Onset  More than a month ago    Pain Frequency  Intermittent    Aggravating Factors   standing, sitting too long    Pain Relieving Factors  stretches, laying down to relax    Multiple Pain Sites  No                       [facility] Adult PT Treatment/Exercise - 01/09/19  0001      Self-Care   Self-Care  Other Self-Care Comments    Other Self-Care Comments   instructed patient on perineal massage to assist with birth to reduce tearing of the perineal body . Demonstrated on the pelvic model      Lumbar Exercises: Seated   Other Seated Lumbar Exercises  sit with ball squeeze 10x hold 1 sec    Other Seated Lumbar Exercises  sitting rolling the blue ball forward and back,       Lumbar Exercises: Quadruped   Other Quadruped Lumbar Exercises  quadruped rocking back and forth; quadruped move knees in and out to open the outlet and reduce pressure    Other Quadruped Lumbar Exercises  quadruped with upper body on physioball and rock forward/backward then side to side      Manual Therapy   Manual Therapy  Joint mobilization    Joint Mobilization  T5-T8 in sitting for rotation             PT Education -  01/09/19 1626    Education Details  perineal massage to reduce tearing during birth of her child    Person(s) Educated  Patient    Methods  Explanation;Demonstration;Handout    Comprehension  Verbalized understanding;Returned demonstration       PT Short Term Goals - 12/17/18 1550      PT SHORT TERM GOAL #1   Title  independent with initial HEP    Time  4    Period  Weeks    Status  Achieved        PT Long Term Goals - 01/09/19 1629      PT LONG TERM GOAL #1   Title  independent with HEP and understand how to progress herself    Baseline  still learning    Time  12    Period  Weeks    Status  On-going      PT LONG TERM GOAL #2   Title  understand ways to manage her pain with correct body mechanics with daily activities    Time  12    Period  Weeks    Status  Achieved      PT LONG TERM GOAL #3   Title  able to sit and stand for 25 to 30 minutes with moderate weight shifting due to pelvis in correct alignment and pain decreased >/= 4/10    Baseline  pain level 9/10, left ilium rotated posteriorly    Time  12    Period  Weeks     Status  Achieved      PT LONG TERM GOAL #4   Title  understand ways to give position herself to give birth that decreases strain on the back and pelvis to decrease strain on lumbar    Time  12    Period  Weeks    Status  On-going            Plan - 01/09/19 1627    Clinical Impression Statement  Patient will be seeing her doctor next week in person. Patient has had [employer] and 1 contraction today. Patient is tired and has increased pressure in the pelvis. Patient understands how to perform perineal massage to reduce tearing during birth. Patient will benefit from skilled therapy to reduce discomfort and work on birthing education.    Personal Factors and Comorbidities  Comorbidity 1    Comorbidities  [redacted] weeks pregnant    Examination-Activity Limitations  Transfers;Bed Mobility;Sit;Stand    Stability/Clinical Decision Making  Evolving/Moderate complexity    Rehab Potential  Excellent    PT Frequency  1x / week    PT Duration  12 weeks    PT Treatment/Interventions  Cryotherapy;Moist Heat;Therapeutic activities;Therapeutic exercise;Patient/family education;Neuromuscular re-education;Manual techniques;Passive range of motion;Spinal Manipulations;Taping    PT Next Visit Plan  Educate on labor positions next, cont soft tissue work, look at updating HEP in next 1-2 visits.    PT Home Exercise Plan  Access Code: [email or web address]    Consulted and Agree with Plan of Care  Patient       Patient will benefit from skilled therapeutic intervention in order to improve the following deficits and impairments:  Decreased range of motion, Increased fascial restricitons, Increased muscle spasms, Pain, Decreased mobility, Decreased strength  Visit Diagnosis: Acute left-sided low back pain with left-sided sciatica  Muscle weakness (generalized)  Pregnant and not yet delivered in second trimester     Problem List Patient Active Problem List   Diagnosis Date Noted  . HSV (  herpes simplex virus)  infection   . Back pain affecting pregnancy   . Supervision of other normal pregnancy, antepartum 06/04/2014    Eulis Fosterheryl Raygen Dahm, PT 01/09/19 4:30 PM   [facility] [address] [place], [place], [address] Phone: [phone]   Fax:  [phone]  Name: Kelly Rush MRN: [id number] Date of Birth: November 25, 1985

## 2019-01-14 ENCOUNTER — Ambulatory Visit: Payer: Medicaid Other | Admitting: Physical Therapy

## 2019-01-16 ENCOUNTER — Encounter: Payer: Self-pay | Admitting: Physical Therapy

## 2019-01-16 ENCOUNTER — Other Ambulatory Visit: Payer: Self-pay

## 2019-01-16 ENCOUNTER — Ambulatory Visit: Payer: Medicaid Other | Admitting: Physical Therapy

## 2019-01-16 ENCOUNTER — Other Ambulatory Visit (HOSPITAL_COMMUNITY)
Admission: RE | Admit: 2019-01-16 | Discharge: 2019-01-16 | Disposition: A | Discharge status: Home / Self Care | Payer: Medicaid Other | Source: Ambulatory Visit | Attending: Family Medicine | Admitting: Family Medicine

## 2019-01-16 ENCOUNTER — Other Ambulatory Visit: Payer: Self-pay | Admitting: Family Medicine

## 2019-01-16 ENCOUNTER — Ambulatory Visit (INDEPENDENT_AMBULATORY_CARE_PROVIDER_SITE_OTHER): Payer: Medicaid Other | Admitting: Family Medicine

## 2019-01-16 VITALS — BP 118/72 | HR 93 | Wt 173.7 lb

## 2019-01-16 DIAGNOSIS — M6281 Muscle weakness (generalized): Secondary | ICD-10-CM

## 2019-01-16 DIAGNOSIS — B009 Herpesviral infection, unspecified: Secondary | ICD-10-CM

## 2019-01-16 DIAGNOSIS — Z23 Encounter for immunization: Secondary | ICD-10-CM | POA: Diagnosis not present

## 2019-01-16 DIAGNOSIS — Z3A36 36 weeks gestation of pregnancy: Secondary | ICD-10-CM

## 2019-01-16 DIAGNOSIS — Z3480 Encounter for supervision of other normal pregnancy, unspecified trimester: Secondary | ICD-10-CM | POA: Diagnosis present

## 2019-01-16 DIAGNOSIS — M5442 Lumbago with sciatica, left side: Secondary | ICD-10-CM | POA: Diagnosis not present

## 2019-01-16 DIAGNOSIS — Z3483 Encounter for supervision of other normal pregnancy, third trimester: Secondary | ICD-10-CM

## 2019-01-16 DIAGNOSIS — Z3492 Encounter for supervision of normal pregnancy, unspecified, second trimester: Secondary | ICD-10-CM

## 2019-01-16 NOTE — Progress Notes (Signed)
   Subjective:  Kelly Rush is a 33 y.o. G3P2002 at [redacted]w[redacted]d being seen today for ongoing prenatal care.  She is currently monitored for the following issues for this low-risk pregnancy and has Supervision of other normal pregnancy, antepartum; HSV (herpes simplex virus) infection; and Back pain affecting pregnancy on their problem list.  Patient reports LOF this morning while lying in bed, clear.  Contractions: Irregular. Vag. Bleeding: None.  Movement: Present. Denies leaking of fluid.   The following portions of the patient's history were reviewed and updated as appropriate: allergies, current medications, past family history, past medical history, past social history, past surgical history and problem list. Problem list updated.  Objective:   Vitals:   01/16/19 0911  BP: 118/72  Pulse: 93  Weight: 173 lb 11.2 oz (78.8 kg)    Fetal Status: Fetal Heart Rate (bpm): 150 Fundal Height: 36 cm Movement: Present     General:  Alert, oriented and cooperative. Patient is in no acute distress.  Skin: Skin is warm and dry. No rash noted.   Cardiovascular: Normal heart rate noted  Respiratory: Normal respiratory effort, no problems with respiration noted  Abdomen: Soft, gravid, appropriate for gestational age. Pain/Pressure: Present     Pelvic: Vag. Bleeding: None     SSE shows visually closed cervix, neg pool, no LOF with valsalva Dilation: Closed      Extremities: Normal range of motion.  Edema: None  Mental Status: Normal mood and affect. Normal behavior. Normal judgment and thought content.   Urinalysis:      Assessment and Plan:  Pregnancy: G3P2002 at [redacted]w[redacted]d  1. Supervision of other normal pregnancy, antepartum - Patient reporting LOF this morning while in bed, SSE shows visually closed cervix with neg pool, no LOF w valsalva, and neg fern and nitrizine ruling out rupture.  - GBS and GN/CT swabs collected today - Accepts flu shot today - Talked at length regarding contraception as  she is not sure if she wants children in the future but definitively does not want more in the short term - Conversation mostly focused on hormonal IUD and possible post-placental insertion, she will consider and discuss again at next visit - Cervicovaginal ancillary only( [place]) - Strep Gp B NAA - Flu Vaccine QUAD 36+ mos IM (Fluarix, Quad PF)  2. HSV (herpes simplex virus) infection - Denies any symptoms today, reports compliance with valtrex  Preterm labor symptoms and general obstetric precautions including but not limited to vaginal bleeding, contractions, leaking of fluid and fetal movement were reviewed in detail with the patient. Please refer to After Visit Summary for other counseling recommendations.   Return in about 1 week (around 01/23/2019) for in person, [facility].   Clarnce Flock, MD

## 2019-01-16 NOTE — Therapy (Signed)
[facility] [address], [address] [place], [place], [address] Phone: [phone]   Fax:  [phone]  Physical Therapy Treatment  Patient Details  Name: Kelly Rush MRN: [id number] Date of Birth: 05-27-1985 Referring Provider (PT): Dr. Catalina AntiguaPeggy Constant   Encounter Date: 01/16/2019  PT End of Session - 01/16/19 1058    Visit Number  8    Date for PT Re-Evaluation  02/13/19    Authorization Type  Medicaid    Authorization Time Period  12/24/18 to 03/17/2019    Authorization - Visit Number  8    Authorization - Number of Visits  12    PT Start Time  1058    PT Stop Time  1138    PT Time Calculation (min)  40 min    Activity Tolerance  Patient tolerated treatment well    Behavior During Therapy  [facility] for tasks assessed/performed       Past Medical History:  Diagnosis Date  . Anemia   . HSV (herpes simplex virus) infection     History reviewed. No pertinent surgical history.  There were no vitals filed for this visit.  Subjective Assessment - 01/16/19 1059    Subjective  Saw MD this morning. Next week she will have her cervix checked. Continues to have pelvic pressure and and back pain.    Currently in Pain?  Yes    Pain Score  6     Pain Location  Back    Pain Descriptors / Indicators  Discomfort    Aggravating Factors   Standing or siting too long    Pain Relieving Factors  Stretches, changing postioins, laying down.                       [facility] Adult PT Treatment/Exercise - 01/16/19 0001      Self-Care   Self-Care  Other Self-Care Comments    Other Self-Care Comments   Concepts of breathing, rhythmic movemetnts snd distraction techniques for labor and delivery      Lumbar Exercises: Seated   Other Seated Lumbar Exercises  Red band horizontal abduction for posturral strength 3x10       Manual Therapy   Manual Therapy  Soft tissue mobilization    Soft tissue mobilization  in right sidely work  on left lumbar paraspinals and  SI joint               PT Short Term Goals - 12/17/18 1550      PT SHORT TERM GOAL #1   Title  independent with initial HEP    Time  4    Period  Weeks    Status  Achieved        PT Long Term Goals - 01/09/19 1629      PT LONG TERM GOAL #1   Title  independent with HEP and understand how to progress herself    Baseline  still learning    Time  12    Period  Weeks    Status  On-going      PT LONG TERM GOAL #2   Title  understand ways to manage her pain with correct body mechanics with daily activities    Time  12    Period  Weeks    Status  Achieved      PT LONG TERM GOAL #3   Title  able to sit and stand for 25 to 30 minutes with moderate weight shifting  due to pelvis in correct alignment and pain decreased >/= 4/10    Baseline  pain level 9/10, left ilium rotated posteriorly    Time  12    Period  Weeks    Status  Achieved      PT LONG TERM GOAL #4   Title  understand ways to give position herself to give birth that decreases strain on the back and pelvis to decrease strain on lumbar    Time  12    Period  Weeks    Status  On-going            Plan - 01/16/19 1058    Clinical Impression Statement  Pt is a few days from [redacted] weeks pregnant. She is having pelvic pressure and LT back/hip pain often. She also presents today wanting to know how to help this new interscapula pain that has come up in the last 24 hrs. Added red band horizontal abd pulls for HEP. Pt understands the concepts of breath, rhythm and distraction during labor and delivery. Soft tissues were mildly tight today. Pt reports pain was abolished at end of session.    Personal Factors and Comorbidities  Comorbidity 1    Comorbidities  [redacted] weeks pregnant    Examination-Activity Limitations  Transfers;Bed Mobility;Sit;Stand    Stability/Clinical Decision Making  Evolving/Moderate complexity    Rehab Potential  Excellent    PT Frequency  1x / week    PT Duration   12 weeks    PT Treatment/Interventions  Cryotherapy;Moist Heat;Therapeutic activities;Therapeutic exercise;Patient/family education;Neuromuscular re-education;Manual techniques;Passive range of motion;Spinal Manipulations;Taping    PT Next Visit Plan  Review red band, soft tissue work, look at [employer] next or whatever needs pt may have.    PT Home Exercise Plan  Access Code: [email or web address]    Consulted and Agree with Plan of Care  Patient       Patient will benefit from skilled therapeutic intervention in order to improve the following deficits and impairments:  Decreased range of motion, Increased fascial restricitons, Increased muscle spasms, Pain, Decreased mobility, Decreased strength  Visit Diagnosis: Acute left-sided low back pain with left-sided sciatica  Muscle weakness (generalized)  Pregnant and not yet delivered in second trimester     Problem List Patient Active Problem List   Diagnosis Date Noted  . HSV (herpes simplex virus) infection   . Back pain affecting pregnancy   . Supervision of other normal pregnancy, antepartum 06/04/2014    Azarius Lambson, PTA 01/16/2019, 11:46 AM  [facility] [address], [address] [place], [place], [address] Phone: [phone]   Fax:  [phone]  Name: Kelly Rush MRN: [id number] Date of Birth: 1985/08/17  Access Code: [email or web address]  URL: [email or web address]  Date: 01/16/2019  Prepared by: Ane Payment   Exercises  Quadruped Cat with Posterior Pelvic Tilt - 10 reps - 1 sets - 1x daily - 7x weekly  Standing Pelvic Tilt to Neutral Spine at Wall - 10 reps - 1 sets - 1x daily - 7x weekly  Seated Pelvic Tilts - 10 reps - 1 sets - 1x daily - 7x weekly  Child's Pose Stretch - 1 reps - 1 sets - 30 sec hold - 1x daily - 7x weekly  Childs Pose Side Bend - 1 reps - 1 sets - 30 sec hold - 1x daily - 7x weekly  Seated Hip Flexor Stretch - 1 reps - 1 sets - 30 sec hold - 1x  daily - 7x weekly  Seated  Hip Flexor Stretch - 1 reps - 1 sets - 30 sec hold - 1x daily - 7x weekly  Seated Hamstring Stretch - 1 reps - 1 sets - 30 sec hold - 1x daily - 7x weekly  Seated Piriformis Stretch - 1 reps - 1 sets - 30 sec hold - 1x daily - 7x weekly  Seated Lumbar Flexion Stretch - 1 reps - 1 sets - 30 sec hold - 1x daily - 7x weekly  Standing Thoracic Spine Stretch - 1 reps - 1 sets - 30 sec hold - 1x daily - 7x weekly  Seated Thoracic Lumbar Extension with Pectoralis Stretch - 1 reps - 1 sets - 15 sec hold - 1x daily - 7x weekly  Seated Trunk Rotation Stretch - 10 reps - 1 sets - 5 hold - 1x daily - 7x weekly  Sidelying Bent Knee Lift at 45 Degrees - 10 reps - 1 sets - 1x daily - 7x weekly  Seated Shoulder Horizontal Abduction with Resistance - 10 reps - 3 sets - 2x daily - 7x weekly  Patient Education  walking program

## 2019-01-16 NOTE — Patient Instructions (Signed)
Breastfeeding and Self-Care It is normal to have some problems when you start to breastfeed your new baby. But there are things that you can do to take care of yourself and help prevent many common problems. This includes keeping your breasts healthy and making sure that your baby's mouth attaches (latches) properly to your nipple for feedings. Work with your doctor or breastfeeding specialist (Science writer) to find what works best for you. Follow these instructions at home: Breastfeeding strategy   Always make sure that your baby latches properly to breastfeed.  Make sure that your baby is in a proper position. Try different breastfeeding positions to find one that works best for you and your baby.  Breastfeed when you feel like you need to make your breasts less full or when your baby shows signs of hunger. This is called "breastfeeding on demand."  Do not delay feedings.  Try to relax when it is time to feed your baby. This helps your body release milk from your breast.  To help increase milk flow: ? Remove a small amount of milk from your breast right before breastfeeding. Do this using a pump or by squeezing with your hand. ? Apply warm, moist heat to your breast right before feeding. You can do this in the shower or with hand towels soaked with warm water. ? Massage your breast right before or during feeding. Breast care   To help your breasts stay healthy and keep them from getting too dry: ? Avoid using soap on your nipples. ? Let your nipples air-dry for 3-4 minutes after each feeding. ? Use only cotton bra pads to soak up breast milk that leaks. Be sure to change the pads if they become soaked with milk. If you use bra pads that can be thrown away, change them often. ? Put some lanolin on your nipples after breastfeeding. Pure lanolin does not need to be washed off your nipple before you feed your baby again. Pure lanolin is not harmful to your baby. ? Rub some breast  milk into your nipples: ? Use your hand to squeeze out a few drops of breast milk. ? Gently massage the milk into your nipples. ? Let your nipples air-dry.  Wear a supportive nursing bra. Avoid wearing: ? Tight clothing. ? Underwire bras or bras that put pressure on your breasts.  Use ice to help relieve pain or swelling of your breasts: ? Put ice in a plastic bag. ? Place a towel between your skin and the bag. ? Leave the ice on for 20 minutes, 2-3 times a day. General instructions  Drink enough fluid to keep your pee (urine) pale yellow.  Get plenty of rest. Sleep when your baby sleeps.  Talk to your doctor or breastfeeding specialist before taking any herbal supplements. Contact a health care provider if:  You have nipple pain.  You have cracking or soreness in your nipples that lasts longer than 1 week.  Your breasts are overfilled with milk (engorgement) and this lasts longer than 48 hours.  You have a fever.  You have pus-like fluid coming from your nipple.  You have redness, a rash, swelling, itching, or burning on your breast.  Your baby does not gain weight.  Your baby loses weight. Summary  There are things that you can do to take care of yourself and help prevent many common breastfeeding problems.  Always make sure that your baby's mouth attaches (latches) to your nipple properly to breastfeed.  Keep your nipples  from getting too dry, drink plenty of fluid, and get plenty of rest.  Feed on demand. Do not delay feedings. This information is not intended to replace advice given to you by your health care provider. Make sure you discuss any questions you have with your health care provider. Document Released: 11/23/2016 Document Revised: 08/08/2018 Document Reviewed: 11/23/2016 Elsevier Patient Education  2020 [employer].   Signs and Symptoms of Labor Labor is your body's natural process of moving your baby, placenta, and umbilical cord out of your  uterus. The process of labor usually starts when your baby is full-term, between 52 and 40 weeks of pregnancy. How will I know when I am close to going into labor? As your body prepares for labor and the birth of your baby, you may notice the following symptoms in the weeks and days before true labor starts:  Having a strong desire to get your home ready to receive your new baby. This is called nesting. Nesting may be a sign that labor is approaching, and it may occur several weeks before birth. Nesting may involve cleaning and organizing your home.  Passing a small amount of thick, bloody mucus out of your vagina (normal bloody show or losing your mucus plug). This may happen more than a week before labor begins, or it might occur right before labor begins as the opening of the cervix starts to widen (dilate). For some women, the entire mucus plug passes at once. For others, smaller portions of the mucus plug may gradually pass over several days.  Your baby moving (dropping) lower in your pelvis to get into position for birth (lightening). When this happens, you may feel more pressure on your bladder and pelvic bone and less pressure on your ribs. This may make it easier to breathe. It may also cause you to need to urinate more often and have problems with bowel movements.  Having "practice contractions" (Braxton Hicks contractions) that occur at irregular (unevenly spaced) intervals that are more than 10 minutes apart. This is also called false labor. False labor contractions are common after exercise or sexual activity, and they will stop if you change position, rest, or drink fluids. These contractions are usually mild and do not get stronger over time. They may feel like: ? A backache or back pain. ? Mild cramps, similar to menstrual cramps. ? Tightening or pressure in your abdomen. Other early symptoms that labor may be starting soon include:  Nausea or loss of appetite.  Diarrhea.  Having  a sudden burst of energy, or feeling very tired.  Mood changes.  Having trouble sleeping. How will I know when labor has begun? Signs that true labor has begun may include:  Having contractions that come at regular (evenly spaced) intervals and increase in intensity. This may feel like more intense tightening or pressure in your abdomen that moves to your back. ? Contractions may also feel like rhythmic pain in your upper thighs or back that comes and goes at regular intervals. ? For first-time mothers, this change in intensity of contractions often occurs at a more gradual pace. ? Women who have given birth before may notice a more rapid progression of contraction changes.  Having a feeling of pressure in the vaginal area.  Your water breaking (rupture of membranes). This is when the sac of fluid that surrounds your baby breaks. When this happens, you will notice fluid leaking from your vagina. This may be clear or blood-tinged. Labor usually starts within 43  hours of your water breaking, but it may take longer to begin. ? Some women notice this as a gush of fluid. ? Others notice that their underwear repeatedly becomes damp. Follow these instructions at home:   When labor starts, or if your water breaks, call your health care provider or nurse care line. Based on your situation, they will determine when you should go in for an exam.  When you are in early labor, you may be able to rest and manage symptoms at home. Some strategies to try at home include: ? Breathing and relaxation techniques. ? Taking a warm bath or shower. ? Listening to music. ? Using a heating pad on the lower back for pain. If you are directed to use heat:  Place a towel between your skin and the heat source.  Leave the heat on for 20-30 minutes.  Remove the heat if your skin turns bright red. This is especially important if you are unable to feel pain, heat, or cold. You may have a greater risk of getting  burned. Get help right away if:  You have painful, regular contractions that are 5 minutes apart or less.  Labor starts before you are [redacted] weeks along in your pregnancy.  You have a fever.  You have a headache that does not go away.  You have bright red blood coming from your vagina.  You do not feel your baby moving.  You have a sudden onset of: ? Severe headache with vision problems. ? Nausea, vomiting, or diarrhea. ? Chest pain or shortness of breath. These symptoms may be an emergency. If your health care provider recommends that you go to the hospital or birth center where you plan to deliver, do not drive yourself. Have someone else drive you, or call emergency services (911 in the [place] Summary  Labor is your body's natural process of moving your baby, placenta, and umbilical cord out of your uterus.  The process of labor usually starts when your baby is full-term, between 6237 and 40 weeks of pregnancy.  When labor starts, or if your water breaks, call your health care provider or nurse care line. Based on your situation, they will determine when you should go in for an exam. This information is not intended to replace advice given to you by your health care provider. Make sure you discuss any questions you have with your health care provider. Document Released: 09/23/2016 Document Revised: 01/16/2017 Document Reviewed: 09/23/2016 Elsevier Patient Education  2020 [employer]

## 2019-01-16 NOTE — Progress Notes (Addendum)
ROB/GBS.  FLU given in LD, tolerated well.  C/o LOF this AM and contractions 10-20 mins apart and pressure.

## 2019-01-17 LAB — CERVICOVAGINAL ANCILLARY ONLY
Chlamydia: NEGATIVE
Neisseria Gonorrhea: NEGATIVE

## 2019-01-18 LAB — STREP GP B NAA: Strep Gp B NAA: NEGATIVE

## 2019-01-21 ENCOUNTER — Encounter: Payer: Self-pay | Admitting: Physical Therapy

## 2019-01-21 ENCOUNTER — Other Ambulatory Visit: Payer: Self-pay

## 2019-01-21 ENCOUNTER — Ambulatory Visit: Payer: Medicaid Other | Admitting: Physical Therapy

## 2019-01-21 DIAGNOSIS — M5442 Lumbago with sciatica, left side: Secondary | ICD-10-CM

## 2019-01-21 DIAGNOSIS — Z3492 Encounter for supervision of normal pregnancy, unspecified, second trimester: Secondary | ICD-10-CM

## 2019-01-21 DIAGNOSIS — M6281 Muscle weakness (generalized): Secondary | ICD-10-CM

## 2019-01-21 NOTE — Therapy (Signed)
[facility] [address], [address] [place], [address] Phone: [phone]   Fax:  [phone]  Physical Therapy Treatment  Patient Details  Name: Kelly Rush MRN: [id number] Date of Birth: December 08, 1985 Referring Provider (PT): Dr. Catalina AntiguaPeggy Constant   Encounter Date: 01/21/2019  PT End of Session - 01/21/19 1544    Visit Number  9    Date for PT Re-Evaluation  02/13/19    Authorization Type  Medicaid    Authorization Time Period  12/24/18 to 03/17/2019    Authorization - Visit Number  9    Authorization - Number of Visits  12    PT Start Time  1545   Pt late and then had to go to the bathroom   PT Stop Time  1613    PT Time Calculation (min)  28 min    Activity Tolerance  Patient tolerated treatment well    Behavior During Therapy  [facility] for tasks assessed/performed       Past Medical History:  Diagnosis Date  . Anemia   . HSV (herpes simplex virus) infection     History reviewed. No pertinent surgical history.  There were no vitals filed for this visit.  Subjective Assessment - 01/21/19 1546    Subjective  I am sore everywhere. I am 37 weeks now. i have dropped and I feel it.    Currently in Pain?  Yes    Pain Score  7     Pain Location  Pelvis    Pain Orientation  Lower    Pain Descriptors / Indicators  Sore    Aggravating Factors   Standing too long    Pain Relieving Factors  Laying on my side can be helpful    Multiple Pain Sites  No                       [facility] Adult PT Treatment/Exercise - 01/21/19 0001      Manual Therapy   Manual Therapy  Soft tissue mobilization    Soft tissue mobilization  in right sidely work on left lumbar paraspinals, QL, gluteals and  SI joint               PT Short Term Goals - 12/17/18 1550      PT SHORT TERM GOAL #1   Title  independent with initial HEP    Time  4    Period  Weeks    Status  Achieved        PT Long Term Goals -  01/09/19 1629      PT LONG TERM GOAL #1   Title  independent with HEP and understand how to progress herself    Baseline  still learning    Time  12    Period  Weeks    Status  On-going      PT LONG TERM GOAL #2   Title  understand ways to manage her pain with correct body mechanics with daily activities    Time  12    Period  Weeks    Status  Achieved      PT LONG TERM GOAL #3   Title  able to sit and stand for 25 to 30 minutes with moderate weight shifting due to pelvis in correct alignment and pain decreased >/= 4/10    Baseline  pain level 9/10, left ilium rotated posteriorly    Time  12    Period  Weeks    Status  Achieved      PT LONG TERM GOAL #4   Title  understand ways to give position herself to give birth that decreases strain on the back and pelvis to decrease strain on lumbar    Time  12    Period  Weeks    Status  On-going            Plan - 01/21/19 1614    Clinical Impression Statement  Pt arrives late then had to be in the bathroom for a few minutes so treatment was limited. Pt continues to have pelvic pressure, contractions intermittently and back and LT hip pain. Lt QL was pretty taught and responded fairly well to soft tissue work, still had some tension at end of session. Scapular pain is reported better. Pt will have internal exam this Thursday.    Personal Factors and Comorbidities  Comorbidity 1    Comorbidities  [redacted] weeks pregnant    Examination-Activity Limitations  Transfers;Bed Mobility;Sit;Stand    Stability/Clinical Decision Making  Evolving/Moderate complexity    Rehab Potential  Excellent    PT Frequency  1x / week    PT Duration  12 weeks    PT Treatment/Interventions  Cryotherapy;Moist Heat;Therapeutic activities;Therapeutic exercise;Patient/family education;Neuromuscular re-education;Manual techniques;Passive range of motion;Spinal Manipulations;Taping    PT Next Visit Plan  Pt will be 38 weeks at next session. Soft tissue work to help  reduce any pain/discomfort. Consider preparing for DC??    PT Home Exercise Plan  Access Code: [email or web address]    Consulted and Agree with Plan of Care  Patient       Patient will benefit from skilled therapeutic intervention in order to improve the following deficits and impairments:  Decreased range of motion, Increased fascial restricitons, Increased muscle spasms, Pain, Decreased mobility, Decreased strength  Visit Diagnosis: Acute left-sided low back pain with left-sided sciatica  Muscle weakness (generalized)  Pregnant and not yet delivered in second trimester     Problem List Patient Active Problem List   Diagnosis Date Noted  . HSV (herpes simplex virus) infection   . Back pain affecting pregnancy   . Supervision of other normal pregnancy, antepartum 06/04/2014    ,, PTA 01/21/2019, 4:19 PM  [facility] [address], [place], [place], [address] Phone: [phone]   Fax:  [phone]  Name: Kelly Rush MRN: [id number] Date of Birth: Dec 08, 1985

## 2019-01-24 ENCOUNTER — Ambulatory Visit (INDEPENDENT_AMBULATORY_CARE_PROVIDER_SITE_OTHER): Payer: Medicaid Other | Admitting: Internal Medicine

## 2019-01-24 ENCOUNTER — Other Ambulatory Visit: Payer: Self-pay

## 2019-01-24 VITALS — BP 117/77 | HR 85 | Wt 176.0 lb

## 2019-01-24 DIAGNOSIS — Z3490 Encounter for supervision of normal pregnancy, unspecified, unspecified trimester: Secondary | ICD-10-CM

## 2019-01-24 DIAGNOSIS — Z3A37 37 weeks gestation of pregnancy: Secondary | ICD-10-CM

## 2019-01-24 DIAGNOSIS — O98313 Other infections with a predominantly sexual mode of transmission complicating pregnancy, third trimester: Secondary | ICD-10-CM

## 2019-01-24 DIAGNOSIS — Z3009 Encounter for other general counseling and advice on contraception: Secondary | ICD-10-CM

## 2019-01-24 DIAGNOSIS — Z3480 Encounter for supervision of other normal pregnancy, unspecified trimester: Secondary | ICD-10-CM

## 2019-01-24 DIAGNOSIS — B009 Herpesviral infection, unspecified: Secondary | ICD-10-CM

## 2019-01-24 NOTE — Progress Notes (Signed)
   PRENATAL VISIT NOTE  Subjective:  Kelly Rush is a 33 y.o. G3P2002 at [redacted]w[redacted]d being seen today for ongoing prenatal care.  She is currently monitored for the following issues for this low-risk pregnancy and has Supervision of other normal pregnancy, antepartum; HSV (herpes simplex virus) infection; and Back pain affecting pregnancy on their problem list.  Patient reports irregular contractions off and on since Monday, no bleeding, no changes in discharge, and no leaking.  Contractions: Irregular. Vag. Bleeding: None.  Movement: Present. Denies leaking of fluid.   She has a history of HSV, currently on prophylactic valacyclovir daily. No recent or current outbreaks or lesions. No complications in prior pregnancies and no concerns today. Denies lightheadedness, dizziness, vision changes, headache, chest pain, SOB, rapid weight gain, and swelling in hands and face.  The following portions of the patient's history were reviewed and updated as appropriate: allergies, current medications, past family history, past medical history, past social history, past surgical history and problem list.   Objective:   Vitals:   01/24/19 1016  BP: 117/77  Pulse: 85  Weight: 176 lb (79.8 kg)    Fetal Status: Fetal Heart Rate (bpm): 152 Fundal Height: 37 cm Movement: Present     General:  Alert, oriented and cooperative. Patient is in no acute distress.  Skin: Skin is warm and dry. No rash noted.   Cardiovascular: Normal heart rate noted, RRR, no murmurs  Respiratory: Normal respiratory effort, no problems with respiration noted, clear to auscultation bilaterally  Abdomen: Soft, gravid, appropriate for gestational age.  Pain/Pressure: Present     Pelvic: Cervical exam performed Dilation: 1 Effacement (%): 50 Station: -3  Extremities: Normal range of motion.  Edema: Trace  Mental Status: Normal mood and affect. Normal behavior. Normal judgment and thought content.   Assessment and Plan:  Pregnancy:  G3P2002 at [redacted]w[redacted]d 1. Supervision of other normal pregnancy, antepartum - Started having irregular contractions on Monday. No leakage of fluid.  - No signs or symptoms of preeclampsia.  - No current concerns. - Planning to breastfeed. - Planning for outpatient circumcision. Advised pt when to make this appt and what to expect with circumcision.  2. HSV (herpes simplex virus) infection - On prophylactic valacyclovir daily - No current lesions or prodromal symptoms. No recent outbreaks.   3. General counseling and advice on female contraception - Counseled pt on contraception options. Pt had questions about tubal ligation vs Mirena vs Paraguard. Advised pt against tubal ligation if she thinks she may want more children in the future. Discussed risks and benefits of tubal ligation vs IUD. All questions answered and pt thinks she would like a post-placental Mirena. Advised pt to let her hospital team know this when she is admitted to L&D.  Term labor symptoms and general obstetric precautions including but not limited to vaginal bleeding, contractions, leaking of fluid and fetal movement were reviewed in detail with the patient. Please refer to After Visit Summary for other counseling recommendations.   Return in about 1 week (around 01/31/2019) for routine PNC.  Future Appointments  Date Time Provider [place]  01/28/2019  3:30 PM Sharion Dove [facility]  02/04/2019  3:30 PM Monico Hoar, PT [facility]    [place], Medical Student   Phill Myron, D.O. [facility] Family Medicine Fellow, [facility], [facility] 01/24/2019, 11:26 AM

## 2019-01-24 NOTE — Patient Instructions (Signed)
Call the office or go to [facility] if:  You begin to have strong, frequent contractions  Your water breaks.  Sometimes it is a big gush of fluid, sometimes it is just a trickle that keeps getting your panties wet or running down your legs  You have vaginal bleeding.  It is normal to have a small amount of spotting if your cervix was checked.   You don't feel your baby moving like normal.  If you don't, get you something to eat and drink and lay down and focus on feeling your baby move.  You should feel at least 10 movements in 2 hours.  If you don't, you should call the office or go to [facility].    Tdap Vaccine  It is recommended that you get the Tdap vaccine during the third trimester of EACH pregnancy to help protect your baby from getting pertussis (whooping cough)  27-36 weeks is the BEST time to do this so that you can pass the protection on to your baby. During pregnancy is better than after pregnancy, but if you are unable to get it during pregnancy it will be offered at the hospital.   You can get this vaccine with us, at the health department, your family doctor, or some local pharmacies  Everyone who will be around your baby should also be up-to-date on their vaccines before the baby comes. Adults (who are not pregnant) only need 1 dose of Tdap during adulthood.   Third Trimester of Pregnancy The third trimester is from week 29 through week 42, months 7 through 9. The third trimester is a time when the fetus is growing rapidly. At the end of the ninth month, the fetus is about 20 inches in length and weighs 6-10 pounds.  BODY CHANGES Your body goes through many changes during pregnancy. The changes vary from woman to woman.   Your weight will continue to increase. You can expect to gain 25-35 pounds (11-16 kg) by the end of the pregnancy.  You may begin to get stretch marks on your hips, abdomen, and breasts.  You may urinate more often because the fetus is  moving lower into your pelvis and pressing on your bladder.  You may develop or continue to have heartburn as a result of your pregnancy.  You may develop constipation because certain hormones are causing the muscles that push waste through your intestines to slow down.  You may develop hemorrhoids or swollen, bulging veins (varicose veins).  You may have pelvic pain because of the weight gain and pregnancy hormones relaxing your joints between the bones in your pelvis. Backaches may result from overexertion of the muscles supporting your posture.  You may have changes in your hair. These can include thickening of your hair, rapid growth, and changes in texture. Some women also have hair loss during or after pregnancy, or hair that feels dry or thin. Your hair will most likely return to normal after your baby is born.  Your breasts will continue to grow and be tender. A yellow discharge may leak from your breasts called colostrum.  Your belly button may stick out.  You may feel short of breath because of your expanding uterus.  You may notice the fetus "dropping," or moving lower in your abdomen.  You may have a bloody mucus discharge. This usually occurs a few days to a week before labor begins.  Your cervix becomes thin and soft (effaced) near your due date. WHAT TO EXPECT AT   YOUR PRENATAL EXAMS  You will have prenatal exams every 2 weeks until week 89. Then, you will have weekly prenatal exams. During a routine prenatal visit:  You will be weighed to make sure you and the fetus are growing normally.  Your blood pressure is taken.  Your abdomen will be measured to track your baby's growth.  The fetal heartbeat will be listened to.  Any test results from the previous visit will be discussed.  You may have a cervical check near your due date to see if you have effaced. At around 36 weeks, your caregiver will check your cervix. At the same time, your caregiver will also perform a  test on the secretions of the vaginal tissue. This test is to determine if a type of bacteria, Group B streptococcus, is present. Your caregiver will explain this further. Your caregiver may ask you:  What your birth plan is.  How you are feeling.  If you are feeling the baby move.  If you have had any abnormal symptoms, such as leaking fluid, bleeding, severe headaches, or abdominal cramping.  If you have any questions. Other tests or screenings that may be performed during your third trimester include:  Blood tests that check for low iron levels (anemia).  Fetal testing to check the health, activity level, and growth of the fetus. Testing is done if you have certain medical conditions or if there are problems during the pregnancy. FALSE LABOR You may feel small, irregular contractions that eventually go away. These are called Braxton Hicks contractions, or false labor. Contractions may last for hours, days, or even weeks before true labor sets in. If contractions come at regular intervals, intensify, or become painful, it is best to be seen by your caregiver.  SIGNS OF LABOR   Menstrual-like cramps.  Contractions that are 5 minutes apart or less.  Contractions that start on the top of the uterus and spread down to the lower abdomen and back.  A sense of increased pelvic pressure or back pain.  A watery or bloody mucus discharge that comes from the vagina. If you have any of these signs before the 37th week of pregnancy, call your caregiver right away. You need to go to the hospital to get checked immediately. HOME CARE INSTRUCTIONS   Avoid all smoking, herbs, alcohol, and unprescribed drugs. These chemicals affect the formation and growth of the baby.  Follow your caregiver's instructions regarding medicine use. There are medicines that are either safe or unsafe to take during pregnancy.  Exercise only as directed by your caregiver. Experiencing uterine cramps is a good sign to  stop exercising.  Continue to eat regular, healthy meals.  Wear a good support bra for breast tenderness.  Do not use hot tubs, steam rooms, or saunas.  Wear your seat belt at all times when driving.  Avoid raw meat, uncooked cheese, cat litter boxes, and soil used by cats. These carry germs that can cause birth defects in the baby.  Take your prenatal vitamins.  Try taking a stool softener (if your caregiver approves) if you develop constipation. Eat more high-fiber foods, such as fresh vegetables or fruit and whole grains. Drink plenty of fluids to keep your urine clear or pale yellow.  Take warm sitz baths to soothe any pain or discomfort caused by hemorrhoids. Use hemorrhoid cream if your caregiver approves.  If you develop varicose veins, wear support hose. Elevate your feet for 15 minutes, 3-4 times a day. Limit salt in your  diet.  Avoid heavy lifting, wear low heal shoes, and practice good posture.  Rest a lot with your legs elevated if you have leg cramps or low back pain.  Visit your dentist if you have not gone during your pregnancy. Use a soft toothbrush to brush your teeth and be gentle when you floss.  A sexual relationship may be continued unless your caregiver directs you otherwise.  Do not travel far distances unless it is absolutely necessary and only with the approval of your caregiver.  Take prenatal classes to understand, practice, and ask questions about the labor and delivery.  Make a trial run to the hospital.  Pack your hospital bag.  Prepare the baby's nursery.  Continue to go to all your prenatal visits as directed by your caregiver. SEEK MEDICAL CARE IF:  You are unsure if you are in labor or if your water has broken.  You have dizziness.  You have mild pelvic cramps, pelvic pressure, or nagging pain in your abdominal area.  You have persistent nausea, vomiting, or diarrhea.  You have a bad smelling vaginal discharge.  You have pain with  urination. SEEK IMMEDIATE MEDICAL CARE IF:   You have a fever.  You are leaking fluid from your vagina.  You have spotting or bleeding from your vagina.  You have severe abdominal cramping or pain.  You have rapid weight loss or gain.  You have shortness of breath with chest pain.  You notice sudden or extreme swelling of your face, hands, ankles, feet, or legs.  You have not felt your baby move in over an hour.  You have severe headaches that do not go away with medicine.  You have vision changes. Document Released: 04/12/2001 Document Revised: 04/23/2013 Document Reviewed: 06/19/2012 [facility] Patient Information 2015 [place], [place]. This information is not intended to replace advice given to you by your health care provider. Make sure you discuss any questions you have with your health care provider.

## 2019-01-24 NOTE — Progress Notes (Signed)
ROB GBS & GC/CT on 01/16/19: NEGATIVE   Pt request cervix check today due to increased pressure and pain.

## 2019-01-28 ENCOUNTER — Other Ambulatory Visit: Payer: Self-pay

## 2019-01-28 ENCOUNTER — Encounter: Payer: Self-pay | Admitting: Physical Therapy

## 2019-01-28 ENCOUNTER — Ambulatory Visit: Payer: Medicaid Other | Admitting: Physical Therapy

## 2019-01-28 DIAGNOSIS — M5442 Lumbago with sciatica, left side: Secondary | ICD-10-CM | POA: Diagnosis not present

## 2019-01-28 DIAGNOSIS — Z3492 Encounter for supervision of normal pregnancy, unspecified, second trimester: Secondary | ICD-10-CM

## 2019-01-28 DIAGNOSIS — M6281 Muscle weakness (generalized): Secondary | ICD-10-CM

## 2019-01-28 NOTE — Therapy (Addendum)
[facility] [address], [address] [place], [place], [address] Phone: [phone]   Fax:  [phone]  Physical Therapy Treatment  Patient Details  Name: Kelly Rush MRN: [id number] Date of Birth: 11/14/1985 Referring Provider (PT): Dr. Peggy Constant   Encounter Date: 01/28/2019  PT End of Session - 01/28/19 1549    Visit Number  10    Date for PT Re-Evaluation  02/13/19    Authorization Type  Medicaid    Authorization Time Period  12/24/18 to 03/17/2019    Authorization - Visit Number  10    Authorization - Number of Visits  12    PT Start Time  1545   Pt 15 min late   PT Stop Time  1615    PT Time Calculation (min)  30 min    Activity Tolerance  Patient tolerated treatment well    Behavior During Therapy  WFL for tasks assessed/performed       Past Medical History:  Diagnosis Date  . Anemia   . HSV (herpes simplex virus) infection     History reviewed. No pertinent surgical history.  There were no vitals filed for this visit.  Subjective Assessment - 01/28/19 1550    Subjective  I had contractions all day yesterday. Pelvic pressure high.    Limitations  Standing;Sitting;Walking    Currently in Pain?  Yes    Pain Score  6     Pain Location  --   Hip and low back   Pain Descriptors / Indicators  Sore    Aggravating Factors   Standing too long    Pain Relieving Factors  Massage but not much right now    Multiple Pain Sites  No                       [facility] Adult PT Treatment/Exercise - 01/28/19 0001      Manual Therapy   Manual Therapy  Soft tissue mobilization    Soft tissue mobilization  in right sidely work on left lumbar paraspinals, QL, gluteals and  SI joint               PT Short Term Goals - 12/17/18 1550      PT SHORT TERM GOAL #1   Title  independent with initial HEP    Time  4    Period  Weeks    Status  Achieved        PT Long Term Goals - 01/09/19 1629      PT LONG TERM GOAL #1   Title  independent with HEP and understand how to progress herself    Baseline  still learning    Time  12    Period  Weeks    Status  On-going      PT LONG TERM GOAL #2   Title  understand ways to manage her pain with correct body mechanics with daily activities    Time  12    Period  Weeks    Status  Achieved      PT LONG TERM GOAL #3   Title  able to sit and stand for 25 to 30 minutes with moderate weight shifting due to pelvis in correct alignment and pain decreased >/= 4/10    Baseline  pain level 9/10, left ilium rotated posteriorly    Time  12    Period  Weeks    Status  Achieved        PT LONG TERM GOAL #4   Title  understand ways to give position herself to give birth that decreases strain on the back and pelvis to decrease strain on lumbar    Time  12    Period  Weeks    Status  On-going            Plan - 01/28/19 1549    Clinical Impression Statement  Pt is [redacted] weeks pregnant. She is 50% effaced and 1 cm dilated. She continues to have LT low bcak and hip pain along with pelvic pressure that is more constant as well as her contractions. She wants to keep her last appt for Monday afternoon. She will see the MD this Thursday. Lt QL tight and congested. PTA educated pt on abdominal/pelvic garments she can wear after having the baby to help support her lumbopelvic area if she continued to have pain in addition to coming back to PT when MD allows.    Personal Factors and Comorbidities  Comorbidity 1    Comorbidities  [redacted] weeks pregnant    Examination-Activity Limitations  Transfers;Bed Mobility;Sit;Stand    Stability/Clinical Decision Making  Evolving/Moderate complexity    Rehab Potential  Excellent    PT Frequency  1x / week    PT Duration  12 weeks    PT Treatment/Interventions  Cryotherapy;Moist Heat;Therapeutic activities;Therapeutic exercise;Patient/family education;Neuromuscular re-education;Manual techniques;Passive range of motion;Spinal  Manipulations;Taping    PT Next Visit Plan  DC next visit. Pt will be [redacted] weeks pregnant.    PT Home Exercise Plan  Access Code: [email or web address]    Consulted and Agree with Plan of Care  Patient       Patient will benefit from skilled therapeutic intervention in order to improve the following deficits and impairments:  Decreased range of motion, Increased fascial restricitons, Increased muscle spasms, Pain, Decreased mobility, Decreased strength  Visit Diagnosis: Acute left-sided low back pain with left-sided sciatica  Muscle weakness (generalized)  Pregnant and not yet delivered in second trimester     Problem List Patient Active Problem List   Diagnosis Date Noted  . HSV (herpes simplex virus) infection   . Back pain affecting pregnancy   . Supervision of other normal pregnancy, antepartum 06/04/2014    Nettie Cromwell, PTA 01/28/2019, 4:15 PM  [facility] [address], [place], [place], [address] Phone: [phone]   Fax:  [phone]  Name: Kelly Rush MRN: [id number] Date of Birth: Apr 26, 1986  PHYSICAL THERAPY DISCHARGE SUMMARY  Visits from Start of Care: 10  Current functional level related to goals / functional outcomes: Patient did not come to her last visit due to having her baby on 02/01/2019.    Remaining deficits: See above.    Education / Equipment: HEP Plan:                                                    Patient goals were partially met. Patient is being discharged due to not returning since the last visit. Patient did not return due to giving birth on 02/01/2019. Thank you for the referral. Earlie Counts, PT 02/04/19 3:43 PM   ?????

## 2019-01-31 ENCOUNTER — Ambulatory Visit (INDEPENDENT_AMBULATORY_CARE_PROVIDER_SITE_OTHER): Payer: Medicaid Other | Admitting: Obstetrics

## 2019-01-31 ENCOUNTER — Other Ambulatory Visit: Payer: Self-pay

## 2019-01-31 ENCOUNTER — Encounter: Payer: Self-pay | Admitting: Obstetrics

## 2019-01-31 DIAGNOSIS — Z3480 Encounter for supervision of other normal pregnancy, unspecified trimester: Secondary | ICD-10-CM

## 2019-01-31 DIAGNOSIS — Z3483 Encounter for supervision of other normal pregnancy, third trimester: Secondary | ICD-10-CM

## 2019-01-31 DIAGNOSIS — Z3A38 38 weeks gestation of pregnancy: Secondary | ICD-10-CM

## 2019-01-31 NOTE — Progress Notes (Signed)
Pt is here for ROB [redacted]w[redacted]d.  

## 2019-01-31 NOTE — Progress Notes (Signed)
Subjective:  Kelly Rush is a 33 y.o. G3P2002 at [redacted]w[redacted]d being seen today for ongoing prenatal care.  She is currently monitored for the following issues for this low-risk pregnancy and has Supervision of other normal pregnancy, antepartum; HSV (herpes simplex virus) infection; and Back pain affecting pregnancy on their problem list.  Patient reports no complaints.  Contractions: Irregular. Vag. Bleeding: None.  Movement: Present. Denies leaking of fluid.   The following portions of the patient's history were reviewed and updated as appropriate: allergies, current medications, past family history, past medical history, past social history, past surgical history and problem list. Problem list updated.  Objective:   Vitals:   01/31/19 0936  BP: 128/81  Pulse: 100  Weight: 178 lb 8 oz (81 kg)    Fetal Status:     Movement: Present     General:  Alert, oriented and cooperative. Patient is in no acute distress.  Skin: Skin is warm and dry. No rash noted.   Cardiovascular: Normal heart rate noted  Respiratory: Normal respiratory effort, no problems with respiration noted  Abdomen: Soft, gravid, appropriate for gestational age. Pain/Pressure: Present     Pelvic:  Cervical exam performed      2cm / 50% / -3 / Vtx  Extremities: Normal range of motion.  Edema: Trace  Mental Status: Normal mood and affect. Normal behavior. Normal judgment and thought content.   Urinalysis:      Assessment and Plan:  Pregnancy: G3P2002 at [redacted]w[redacted]d  1. Supervision of other normal pregnancy, antepartum   Term labor symptoms and general obstetric precautions including but not limited to vaginal bleeding, contractions, leaking of fluid and fetal movement were reviewed in detail with the patient. Please refer to After Visit Summary for other counseling recommendations.  Return in about 1 week (around 02/07/2019) for MyChart.   Shelly Bombard, MD  01/31/2019

## 2019-02-01 ENCOUNTER — Inpatient Hospital Stay (HOSPITAL_COMMUNITY): Payer: Medicaid Other | Admitting: Anesthesiology

## 2019-02-01 ENCOUNTER — Encounter (HOSPITAL_COMMUNITY): Payer: Self-pay

## 2019-02-01 ENCOUNTER — Inpatient Hospital Stay (HOSPITAL_COMMUNITY)
Admission: AD | Admit: 2019-02-01 | Discharge: 2019-02-02 | DRG: 806 | Disposition: A | Discharge status: Home / Self Care | Payer: Medicaid Other | Attending: Family Medicine | Admitting: Family Medicine

## 2019-02-01 ENCOUNTER — Other Ambulatory Visit: Payer: Self-pay

## 2019-02-01 DIAGNOSIS — A6000 Herpesviral infection of urogenital system, unspecified: Secondary | ICD-10-CM | POA: Diagnosis present

## 2019-02-01 DIAGNOSIS — O321XX0 Maternal care for breech presentation, not applicable or unspecified: Secondary | ICD-10-CM | POA: Diagnosis present

## 2019-02-01 DIAGNOSIS — O9832 Other infections with a predominantly sexual mode of transmission complicating childbirth: Secondary | ICD-10-CM | POA: Diagnosis present

## 2019-02-01 DIAGNOSIS — O26893 Other specified pregnancy related conditions, third trimester: Secondary | ICD-10-CM | POA: Diagnosis present

## 2019-02-01 DIAGNOSIS — Z3480 Encounter for supervision of other normal pregnancy, unspecified trimester: Secondary | ICD-10-CM

## 2019-02-01 DIAGNOSIS — Z3A38 38 weeks gestation of pregnancy: Secondary | ICD-10-CM

## 2019-02-01 DIAGNOSIS — Z30430 Encounter for insertion of intrauterine contraceptive device: Secondary | ICD-10-CM

## 2019-02-01 DIAGNOSIS — Z20828 Contact with and (suspected) exposure to other viral communicable diseases: Secondary | ICD-10-CM | POA: Diagnosis present

## 2019-02-01 DIAGNOSIS — B009 Herpesviral infection, unspecified: Secondary | ICD-10-CM | POA: Diagnosis present

## 2019-02-01 LAB — CBC
HCT: 29.2 % — ABNORMAL LOW (ref 36.0–46.0)
Hemoglobin: 9.3 g/dL — ABNORMAL LOW (ref 12.0–15.0)
MCH: 26.8 pg (ref 26.0–34.0)
MCHC: 31.8 g/dL (ref 30.0–36.0)
MCV: 84.1 fL (ref 80.0–100.0)
Platelets: 181 10*3/uL (ref 150–400)
RBC: 3.47 MIL/uL — ABNORMAL LOW (ref 3.87–5.11)
RDW: 13.8 % (ref 11.5–15.5)
WBC: 8.5 10*3/uL (ref 4.0–10.5)
nRBC: 0 % (ref 0.0–0.2)

## 2019-02-01 LAB — TYPE AND SCREEN
ABO/RH(D): B POS
Antibody Screen: NEGATIVE

## 2019-02-01 LAB — ABO/RH: ABO/RH(D): B POS

## 2019-02-01 LAB — SARS CORONAVIRUS 2 BY RT PCR (HOSPITAL ORDER, PERFORMED IN ~~LOC~~ HOSPITAL LAB): SARS Coronavirus 2: NEGATIVE

## 2019-02-01 LAB — RPR: RPR Ser Ql: NONREACTIVE

## 2019-02-01 MED ORDER — DIPHENHYDRAMINE HCL 50 MG/ML IJ SOLN: 12.5000 mg | INTRAMUSCULAR | Status: DC | PRN

## 2019-02-01 MED ORDER — EPHEDRINE 5 MG/ML INJ: 10.0000 mg | INTRAVENOUS | Status: DC | PRN

## 2019-02-01 MED ORDER — DIBUCAINE (PERIANAL) 1 % EX OINT: 1.0000 "application " | TOPICAL_OINTMENT | CUTANEOUS | Status: DC | PRN

## 2019-02-01 MED ORDER — ACETAMINOPHEN 325 MG PO TABS: 650.0000 mg | ORAL_TABLET | ORAL | Status: DC | PRN

## 2019-02-01 MED ORDER — OXYTOCIN BOLUS FROM INFUSION
500.0000 mL | Freq: Once | INTRAVENOUS | Status: AC
  Administered 2019-02-01: 500 mL via INTRAVENOUS

## 2019-02-01 MED ORDER — OXYTOCIN 40 UNITS IN NORMAL SALINE INFUSION - SIMPLE MED
2.5000 [IU]/h | INTRAVENOUS | Status: DC
  Filled 2019-02-01: qty 1000

## 2019-02-01 MED ORDER — PHENYLEPHRINE 40 MCG/ML (10ML) SYRINGE FOR IV PUSH (FOR BLOOD PRESSURE SUPPORT): 80.0000 ug | PREFILLED_SYRINGE | INTRAVENOUS | Status: DC | PRN

## 2019-02-01 MED ORDER — ONDANSETRON HCL 4 MG PO TABS: 4.0000 mg | ORAL_TABLET | ORAL | Status: DC | PRN

## 2019-02-01 MED ORDER — IBUPROFEN 600 MG PO TABS
600.0000 mg | ORAL_TABLET | Freq: Four times a day (QID) | ORAL | Status: DC
  Administered 2019-02-01 – 2019-02-02 (×4): 600 mg via ORAL
  Filled 2019-02-01 (×4): qty 1

## 2019-02-01 MED ORDER — ZOLPIDEM TARTRATE 5 MG PO TABS: 5.0000 mg | ORAL_TABLET | Freq: Every evening | ORAL | Status: DC | PRN

## 2019-02-01 MED ORDER — SENNOSIDES-DOCUSATE SODIUM 8.6-50 MG PO TABS
2.0000 | ORAL_TABLET | ORAL | Status: DC
  Administered 2019-02-02: 2 via ORAL
  Filled 2019-02-01: qty 2

## 2019-02-01 MED ORDER — SOD CITRATE-CITRIC ACID 500-334 MG/5ML PO SOLN: 30.0000 mL | ORAL | Status: DC | PRN

## 2019-02-01 MED ORDER — TETANUS-DIPHTH-ACELL PERTUSSIS 5-2.5-18.5 LF-MCG/0.5 IM SUSP: 0.5000 mL | Freq: Once | INTRAMUSCULAR | Status: DC

## 2019-02-01 MED ORDER — DIPHENHYDRAMINE HCL 25 MG PO CAPS: 25.0000 mg | ORAL_CAPSULE | Freq: Four times a day (QID) | ORAL | Status: DC | PRN

## 2019-02-01 MED ORDER — ACETAMINOPHEN 325 MG PO TABS
650.0000 mg | ORAL_TABLET | ORAL | Status: DC | PRN
  Administered 2019-02-02: 650 mg via ORAL
  Filled 2019-02-01: qty 2

## 2019-02-01 MED ORDER — LIDOCAINE HCL (PF) 1 % IJ SOLN: 30.0000 mL | INTRAMUSCULAR | Status: DC | PRN

## 2019-02-01 MED ORDER — LACTATED RINGERS IV SOLN
INTRAVENOUS | Status: DC
  Administered 2019-02-01 (×2): via INTRAVENOUS

## 2019-02-01 MED ORDER — WITCH HAZEL-GLYCERIN EX PADS: 1.0000 "application " | MEDICATED_PAD | CUTANEOUS | Status: DC | PRN

## 2019-02-01 MED ORDER — SIMETHICONE 80 MG PO CHEW: 80.0000 mg | CHEWABLE_TABLET | ORAL | Status: DC | PRN

## 2019-02-01 MED ORDER — SODIUM CHLORIDE (PF) 0.9 % IJ SOLN
INTRAMUSCULAR | Status: DC | PRN
  Administered 2019-02-01: 12 mL/h via EPIDURAL

## 2019-02-01 MED ORDER — COCONUT OIL OIL
1.0000 "application " | TOPICAL_OIL | Status: DC | PRN
  Administered 2019-02-02: 1 via TOPICAL

## 2019-02-01 MED ORDER — ONDANSETRON HCL 4 MG/2ML IJ SOLN: 4.0000 mg | INTRAMUSCULAR | Status: DC | PRN

## 2019-02-01 MED ORDER — LACTATED RINGERS IV SOLN: 500.0000 mL | Freq: Once | INTRAVENOUS | Status: DC

## 2019-02-01 MED ORDER — BENZOCAINE-MENTHOL 20-0.5 % EX AERO
1.0000 "application " | INHALATION_SPRAY | CUTANEOUS | Status: DC | PRN
  Administered 2019-02-01: 1 via TOPICAL
  Filled 2019-02-01: qty 56

## 2019-02-01 MED ORDER — LEVONORGESTREL 19.5 MCG/DAY IU IUD
INTRAUTERINE_SYSTEM | Freq: Once | INTRAUTERINE | Status: AC
  Administered 2019-02-01: 1 via INTRAUTERINE
  Filled 2019-02-01: qty 1

## 2019-02-01 MED ORDER — PRENATAL MULTIVITAMIN CH
1.0000 | ORAL_TABLET | Freq: Every day | ORAL | Status: DC
  Administered 2019-02-02: 1 via ORAL
  Filled 2019-02-01: qty 1

## 2019-02-01 MED ORDER — LIDOCAINE HCL (PF) 1 % IJ SOLN
INTRAMUSCULAR | Status: DC | PRN
  Administered 2019-02-01: 10 mL via EPIDURAL
  Administered 2019-02-01: 1 mL via EPIDURAL

## 2019-02-01 MED ORDER — FENTANYL-BUPIVACAINE-NACL 0.5-0.125-0.9 MG/250ML-% EP SOLN
12.0000 mL/h | EPIDURAL | Status: DC | PRN
  Filled 2019-02-01: qty 250

## 2019-02-01 MED ORDER — ONDANSETRON HCL 4 MG/2ML IJ SOLN
4.0000 mg | Freq: Four times a day (QID) | INTRAMUSCULAR | Status: DC | PRN
  Administered 2019-02-01: 08:00:00 4 mg via INTRAVENOUS
  Filled 2019-02-01: qty 2

## 2019-02-01 MED ORDER — LACTATED RINGERS IV SOLN
500.0000 mL | INTRAVENOUS | Status: DC | PRN
  Administered 2019-02-01 (×2): 500 mL via INTRAVENOUS

## 2019-02-01 NOTE — MAU Note (Signed)
Contractions since 0230 consistantly, every 5 min apart.  No leaking. No bleeding. Baby moving well.

## 2019-02-01 NOTE — Discharge Summary (Signed)
Postpartum Discharge Summary   Patient Name: Kelly Rush DOB: 06/04/1985 MRN: [id number]  Date of admission: 02/01/2019 Delivering Provider: Starr Lake   Date of discharge: 02/02/2019  Admitting diagnosis:  38 WKS, CTX Intrauterine pregnancy: [redacted]w[redacted]d    Secondary diagnosis:  Active Problems:   HSV (herpes simplex virus) infection   Labor and delivery, indication for care   [redacted] weeks gestation of pregnancy  Additional problems: None     Discharge diagnosis: Term Pregnancy Delivered                                                                                                Post partum procedures:PP IUD (Mirena)  Augmentation: none  Complications: None  Hospital course:  Onset of Labor With Vaginal Delivery     33y.o. yo [id number] 313w6das admitted3w6das admitted in Active Labor on 02/01/2019. Patient had an uncomplicated labor course as follows:  Membrane Rupture Time/Date: 11:26 AM ,02/01/2019   Intrapartum Procedures: Episiotomy: None [1]                                         Lacerations:  None [1]  Patient had a delivery of a Viable infant. 02/01/2019  Information for the patient's newborn:  JaClaribel, Sachs0[[id number]]Delivery Method: Vaginal, Spontaneous(Filed from Delivery Summary)     Pateint had an uncomplicated postpartum course.  She is ambulating, tolerating a regular diet, passing flatus, and urinating well. Patient is discharged home in stable condition on 02/02/19.  Delivery time: 1:38 PM    Magnesium Sulfate received: No BMZ received: No Rhophylac:No MMR:No Transfusion:No  Physical exam  Vitals:   02/01/19 1649 02/01/19 2000 02/02/19 0010 02/02/19 0427  BP: 110/81 128/76 109/67 112/74  Pulse: 79 78 81 70  Resp:  _0 Temp: 99.1 F (37.3 C) 98.5 F (36.9 C) 98.4 F (36.9 C) 98.5 F (36.9 C)  TempSrc: Oral Oral Oral Oral  SpO2: 99% 100%     General: alert, cooperative and no distress Lochia: appropriate Uterine Fundus:  firm Incision: N/A DVT Evaluation: No evidence of DVT seen on physical exam. Labs: Lab Results  Component Value Date   WBC 8.0 02/02/2019   HGB 8.7 (L) 02/02/2019   HCT 27.5 (L) 02/02/2019   MCV 84.1 02/02/2019   PLT 161 02/02/2019   CMP Latest Ref Rng & Units 05/15/2016  Glucose 65 - 99 mg/dL 98  BUN 6 - 20 mg/dL 11  Creatinine 0.44 - 1.00 mg/dL 0.58  Sodium 135 - 145 mmol/L 135  Potassium 3.5 - 5.1 mmol/L 4.1  Chloride 101 - 111 mmol/L 105  CO2 22 - 32 mmol/L 23  Calcium 8.9 - 10.3 mg/dL 9.1  Total Protein 6.5 - 8.1 g/dL 6.8  Total Bilirubin 0.3 - 1.2 mg/dL 0.7  Alkaline Phos 38 - 126 U/L 53  AST 15 - 41 U/L 15  ALT 14 - 54 U/L 14    Discharge instruction: per After Visit Summary and "Baby  and Me Booklet".  After visit meds:  Allergies as of 02/02/2019   No Known Allergies     Medication List    TAKE these medications   Comfort Fit Maternity Supp Med Misc 1 Device by Does not apply route daily.   ibuprofen 800 MG tablet Commonly known as: ADVIL Take 1 tablet (800 mg total) by mouth every 8 (eight) hours as needed.   pantoprazole 40 MG tablet Commonly known as: Protonix Take 1 tablet (40 mg total) by mouth daily. What changed:   when to take this  reasons to take this   valACYclovir 1000 MG tablet Commonly known as: Valtrex Take 1 tablet (1,000 mg total) by mouth daily. FOR SUPPRESSION.   Vitafol Gummies 3.33-0.333-34.8 MG Chew Chew 3 tablets by mouth daily before breakfast.       Diet: routine diet  Activity: Advance as tolerated. Pelvic rest for 6 weeks.   Outpatient follow up:4 weeks Follow up Appt: Future Appointments  Date Time Provider Department Center  02/04/2019  3:30 PM Gray, Cheryl F, PT [facility]  02/07/2019  9:45 AM Harper, Charles A, MD [facility] None  02/14/2019 10:00 AM Burleson, Terri L, NP [facility] None   Follow up Visit:   Please schedule this patient for Postpartum visit in: 4 weeks with the following provider: Any  provider. Should be in person to check  IUD strings.  For C/S patients schedule nurse incision check in weeks 2 weeks: no Low risk pregnancy complicated by: nothing Delivery mode:  SVD Anticipated Birth Control:  IUD-placed in hospital PP Procedures needed: NA  Schedule Integrated BH visit: no      Newborn Data: Live born female  Birth Weight:   APGAR: 8, 9  Newborn Delivery   Birth date/time: 02/01/2019 13:38:00 Delivery type: Vaginal, Spontaneous      Baby Feeding: Breast Disposition:home with mother  Heather Hogan DNP, CNM  02/02/19  11:49 AM     

## 2019-02-01 NOTE — Anesthesia Preprocedure Evaluation (Signed)
Anesthesia Evaluation  Patient identified by MRN, date of birth, ID band Patient awake    Reviewed: Allergy & Precautions, H&P , NPO status , Patient's Chart, lab work & pertinent test results  Airway Mallampati: II  TM Distance: >3 FB Neck ROM: full    Dental no notable dental hx.    Pulmonary neg pulmonary ROS,    Pulmonary exam normal        Cardiovascular negative cardio ROS Normal cardiovascular exam     Neuro/Psych negative neurological ROS  negative psych ROS   GI/Hepatic negative GI ROS, Neg liver ROS,   Endo/Other  negative endocrine ROS  Renal/GU negative Renal ROS  negative genitourinary   Musculoskeletal negative musculoskeletal ROS (+)   Abdominal Normal abdominal exam  (+)   Peds  Hematology  (+) anemia ,   Anesthesia Other Findings   Reproductive/Obstetrics (+) Pregnancy                             Anesthesia Physical  Anesthesia Plan  ASA: II  Anesthesia Plan: Epidural   Post-op Pain Management:    Induction:   PONV Risk Score and Plan:   Airway Management Planned:   Additional Equipment: None  Intra-op Plan:   Post-operative Plan:   Informed Consent: I have reviewed the patients History and Physical, chart, labs and discussed the procedure including the risks, benefits and alternatives for the proposed anesthesia with the patient or authorized representative who has indicated his/her understanding and acceptance.       Plan Discussed with:   Anesthesia Plan Comments:         Anesthesia Quick Evaluation

## 2019-02-01 NOTE — H&P (Signed)
OBSTETRIC ADMISSION HISTORY AND PHYSICAL  Kelly Rush is a 33 y.o. female G3P2002 with IUP at [redacted]w[redacted]d by LMP presenting for SOL. Reports ctx since 0230 increasing in strength and frequency. She reports +FMs, No LOF, no VB, no blurry vision, headaches or peripheral edema, and RUQ pain.  She plans on breast feeding. She requests PP IUD for birth control. She received her prenatal care at [place]: By LMP --->  Estimated Date of Delivery: 02/09/19  Sono:  5/18  @[redacted]w[redacted]d , CWD, normal anatomy, breech presentation, posterior fundal placental lie, 264g, 38% EFW  Prenatal History/Complications: Hx of HSV on Valtrex  Past Medical History: Past Medical History:  Diagnosis Date  . Anemia   . HSV (herpes simplex virus) infection     Past Surgical History: History reviewed. No pertinent surgical history.  Obstetrical History: OB History    Gravida  3   Para  2   Term  2   Preterm      AB      Living  2     SAB      TAB      Ectopic      Multiple  0   Live Births  2           Social History: Social History   Socioeconomic History  . Marital status: Single    Spouse name: Not on file  . Number of children: Not on file  . Years of education: Not on file  . Highest education level: Not on file  Occupational History  . Not on file  Social Needs  . Financial resource strain: Not on file  . Food insecurity    Worry: Not on file    Inability: Not on file  . Transportation needs    Medical: Not on file    Non-medical: Not on file  Tobacco Use  . Smoking status: Never Smoker  . Smokeless tobacco: Never Used  Substance and Sexual Activity  . Alcohol use: No  . Drug use: No    Frequency: 3.0 times per week  . Sexual activity: Yes  Lifestyle  . Physical activity    Days per week: Not on file    Minutes per session: Not on file  . Stress: Not on file  Relationships  . Social Herbalist on phone: Not on file    Gets together: Not on file    Attends religious service: Not on file    Active member of club or organization: Not on file    Attends meetings of clubs or organizations: Not on file    Relationship status: Not on file  Other Topics Concern  . Not on file  Social History Narrative  . Not on file    Family History: Family History  Problem Relation Age of Onset  . Hypertension Mother   . Hypertension Father     Allergies: No Known Allergies  Medications Prior to Admission  Medication Sig Dispense Refill Last Dose  . pantoprazole (PROTONIX) 40 MG tablet Take 1 tablet (40 mg total) by mouth daily. 30 tablet 5 Past Month at Unknown time  . Prenatal Vit-Fe Phos-FA-Omega (VITAFOL GUMMIES) 3.33-0.333-34.8 MG CHEW Chew 3 tablets by mouth daily before breakfast. 90 tablet 11 01/31/2019 at Unknown time  . valACYclovir (VALTREX) 1000 MG tablet Take 1 tablet (1,000 mg total) by mouth daily. FOR SUPPRESSION. 30 tablet 11 01/31/2019 at Unknown time  . Elastic Bandages & Supports (COMFORT FIT MATERNITY  SUPP MED) MISC 1 Device by Does not apply route daily. 1 each 0      Review of Systems   All systems reviewed and negative except as stated in HPI  Blood pressure 137/81, pulse 81, temperature 98.6 F (37 C), temperature source Oral, resp. rate 16, last menstrual period 05/05/2018, SpO2 98 %, unknown if currently breastfeeding. General appearance: alert, cooperative, appears stated age and no distress Lungs: normal effort Heart: regular rate  Abdomen: soft, non-tender; bowel sounds normal Pelvic: gravid uterus GU: No vaginal lesions  Extremities: Homans sign is negative, no sign of DVT Fetal monitoringBaseline: 140 bpm, Variability: Good {> 6 bpm), Accelerations: Reactive and Decelerations: Absent Uterine activityFrequency: Every 5 minutes Dilation: 5 Effacement (%): 90 Station: -2 Exam by:: benji stanley RN   Prenatal labs: ABO, Rh: B/Positive/-- (03/23 0919) Antibody: Negative (03/23 0919) Rubella: 1.84 (03/23  0919) RPR: Non Reactive (07/13 0849)  HBsAg: Negative (03/23 0919)  HIV: Non Reactive (07/13 0849)  GBS: --Theda Sers (09/16 0928)  2 hr Glucola WNL Genetic screening  Low risk NIPS Anatomy US WNL  Prenatal Transfer Tool  Maternal Diabetes: No Genetic Screening: Normal Maternal Ultrasounds/Referrals: Normal Fetal Ultrasounds or other Referrals:  None Maternal Substance Abuse:  No Significant Maternal Medications:  None Significant Maternal Lab Results: Group B Strep negative  Results for orders placed or performed during the hospital encounter of 02/01/19 (from the past 24 hour(s))  CBC   Collection Time: 02/01/19  6:25 AM  Result Value Ref Range   WBC 8.5 4.0 - 10.5 K/uL   RBC 3.47 (L) 3.87 - 5.11 MIL/uL   Hemoglobin 9.3 (L) 12.0 - 15.0 g/dL   HCT 81.1 (L) 57.2 - 62.0 %   MCV 84.1 80.0 - 100.0 fL   MCH 26.8 26.0 - 34.0 pg   MCHC 31.8 30.0 - 36.0 g/dL   RDW 35.5 97.4 - 16.3 %   Platelets 181 150 - 400 K/uL   nRBC 0.0 0.0 - 0.2 %    Patient Active Problem List   Diagnosis Date Noted  . Labor and delivery, indication for care 02/01/2019  . [redacted] weeks gestation of pregnancy 02/01/2019  . HSV (herpes simplex virus) infection   . Back pain affecting pregnancy   . Supervision of other normal pregnancy, antepartum 06/04/2014    Assessment/Plan:  Kelly Rush is a 33 y.o. G3P2002 at [redacted]w[redacted]d here for SOL.  #Labor: SOL. Expectant management.  #Pain: Desires epidural #FWB: Cat I; EFW: 3300g #ID:  GBS neg #MOF: Breast #MOC: PP IUD; consented and device ordered #Circ:  declines #Hx of HSV: no vaginal lesions; reports compliance with Valtrex   Kelly Birkenhead, MD OB Family Medicine Fellow, [facility] for [facility], [facility] 02/01/2019, 7:33 AM

## 2019-02-01 NOTE — Anesthesia Postprocedure Evaluation (Signed)
Anesthesia Post Note  Patient: Kelly Rush  Procedure(s) Performed: AN AD [place]     Patient location during evaluation: Mother Baby Anesthesia Type: Epidural Level of consciousness: awake and alert Pain management: pain level controlled Vital Signs Assessment: post-procedure vital signs reviewed and stable Respiratory status: spontaneous breathing, nonlabored ventilation and respiratory function stable Cardiovascular status: stable Postop Assessment: no headache, no backache and epidural receding Anesthetic complications: no    Last Vitals:  Vitals:   02/01/19 1535 02/01/19 1649  BP: 127/75 110/81  Pulse: 82 79  Resp: 17   Temp: 37.5 C 37.3 C  SpO2:  99%    Last Pain:  Vitals:   02/01/19 1649  TempSrc: Oral  PainSc: 0-No pain   Pain Goal:                Epidural/Spinal Function Cutaneous sensation: Normal sensation (02/01/19 1649), Patient able to flex knees: Yes (02/01/19 1649), Patient able to lift hips off bed: Yes (02/01/19 1649), Back pain beyond tenderness at insertion site: No (02/01/19 1649), Progressively worsening motor and/or sensory loss: No (02/01/19 1649), Bowel and/or bladder incontinence post epidural: No (02/01/19 1649)  Danna Casella

## 2019-02-01 NOTE — Anesthesia Procedure Notes (Signed)
Epidural Patient location during procedure: OB Start time: 02/01/2019 8:00 AM End time: 02/01/2019 8:15 AM  Staffing Anesthesiologist: Pervis Hocking, DO Performed: anesthesiologist   Preanesthetic Checklist Completed: patient identified, pre-op evaluation, timeout performed, IV checked, risks and benefits discussed and monitors and equipment checked  Epidural Patient position: sitting Prep: site prepped and draped and DuraPrep Patient monitoring: continuous pulse ox, blood pressure, heart rate and cardiac monitor Approach: midline Location: L3-L4 Injection technique: LOR air  Needle:  Needle type: Tuohy  Needle gauge: 17 G Needle length: 9 cm Needle insertion depth: 6 cm Catheter type: closed end flexible Catheter size: 19 Gauge Catheter at skin depth: 12 cm Test dose: negative  Assessment Sensory level: T8 Events: blood not aspirated, injection not painful, no injection resistance, negative IV test and no paresthesia  Additional Notes Patient identified. Risks/Benefits/Options discussed with patient including but not limited to bleeding, infection, nerve damage, paralysis, failed block, incomplete pain control, headache, blood pressure changes, nausea, vomiting, reactions to medication both or allergic, itching and postpartum back pain. Confirmed with bedside nurse the patient's most recent platelet count. Confirmed with patient that they are not currently taking any anticoagulation, have any bleeding history or any family history of bleeding disorders. Patient expressed understanding and wished to proceed. All questions were answered. Sterile technique was used throughout the entire procedure. Please see nursing notes for vital signs. Test dose was given through epidural catheter and negative prior to continuing to dose epidural or start infusion. Warning signs of high block given to the patient including shortness of breath, tingling/numbness in hands, complete motor  block, or any concerning symptoms with instructions to call for help. Patient was given instructions on fall risk and not to get out of bed. All questions and concerns addressed with instructions to call with any issues or inadequate analgesia.  Reason for block:procedure for pain

## 2019-02-02 LAB — CBC
HCT: 27.5 % — ABNORMAL LOW (ref 36.0–46.0)
Hemoglobin: 8.7 g/dL — ABNORMAL LOW (ref 12.0–15.0)
MCH: 26.6 pg (ref 26.0–34.0)
MCHC: 31.6 g/dL (ref 30.0–36.0)
MCV: 84.1 fL (ref 80.0–100.0)
Platelets: 161 10*3/uL (ref 150–400)
RBC: 3.27 MIL/uL — ABNORMAL LOW (ref 3.87–5.11)
RDW: 13.9 % (ref 11.5–15.5)
WBC: 8 10*3/uL (ref 4.0–10.5)
nRBC: 0 % (ref 0.0–0.2)

## 2019-02-02 MED ORDER — IBUPROFEN 800 MG PO TABS: 800.0000 mg | ORAL_TABLET | Freq: Three times a day (TID) | ORAL | 0 refills | Status: DC | PRN

## 2019-02-02 NOTE — Lactation Note (Signed)
This note was copied from a baby's chart. Lactation Consultation Note  Patient Name: Kelly Rush [id number] Date: 02/02/2019 Reason for consult: Initial assessment;Early term 37-38.6wks;Infant weight loss  2 hours old ETI female who is being exclusively BF by his mother, she's a P3 and experienced BF. Mom and baby are going home today, baby is at 2% weight loss. Per mom BF is going well, baby is having longer feedings now and she can feel he's transferring, last LATCH score was 6 last night though, no LATCH score recorded today.   Offered assistance with latch but mom politely declined, she stated that her nipples are a little "tender" but when assisting mom with hand expression noticed that there's no signs of trauma, just a little thickness on her tissue, it's not very compressible but explained to mom that with lots of hand expression and pre-pumping will help lessen the areolar edema. Colostrum drops pour easily out of both breasts. Mom has a hand pump and a DEBP at home.  Reviewed discharge instructions, engorgement prevention/treatment, treatment/prevention for sore nipples and red flags on when to call baby's pediatrician. Encouraged mom to keep feeding baby STS on cues 8-12 times/24 hours or sooner if feeding cues are present. Parents reported all questions and concerns were answered, they're both aware of [place] OP services and will contact PRN.   Maternal Data    Feeding Feeding Type: Breast Fed  LATCH Score                   Interventions Interventions: Breast feeding basics reviewed;Breast massage;Hand express;Breast compression  Lactation Tools Discussed/Used     Consult Status Consult Status: Complete Date: 02/02/19 Follow-up type: Call as needed    [name] 02/02/2019, 5:18 PM

## 2019-02-02 NOTE — Lactation Note (Signed)
This note was copied from a baby's chart. Lactation Consultation Note Baby 69 hrs old. Mom BF her now 60 months old for 24 months w/o difficulty. Mom has med/lg everted nipples. Nipple tissue slightly hard/thick. Encouraged to stimulate to soften w/finger tips for easier latch.  Baby in crib cueing. Mom states fed 1 hr ago for 10-15 min. Assisted in latching. Discussed feeding position options. Mom states football is better right now. Mom put a pillow to her side. LC gave mom another pillow, adjusted mom's position in bed for comfort.  Took several times for latching. Demonstrated chin tug. Baby suckle a few times then hold in mouth. Finally started suckling well. Heard occasional swallows. Newborn feeding habits, behavior, STS, I&O, breast massage, supply and demand discussed. Mom encouraged to feed baby 8-12 times/24 hours and with feeding cues. Encouraged mom to wake baby for feedings if hasn't cued in 3 hrs. Mom demonstrated hand expression w/colostrum noted. Encouraged to call for assistance or questions. Lactation brochure given.   Patient Name: Boy Genell Thede [id number] Date: 02/02/2019 Reason for consult: Initial assessment;Early term 37-38.6wks   Maternal Data    Feeding Feeding Type: Breast Fed  LATCH Score Latch: Repeated attempts needed to sustain latch, nipple held in mouth throughout feeding, stimulation needed to elicit sucking reflex.  Audible Swallowing: A few with stimulation  Type of Nipple: Everted at rest and after stimulation  Comfort (Breast/Nipple): Filling, red/small blisters or bruises, mild/mod discomfort(mom states slightly tender/intact)  Hold (Positioning): Assistance needed to correctly position infant at breast and maintain latch.  LATCH Score: 6  Interventions Interventions: Breast feeding basics reviewed;Adjust position;Assisted with latch;Support pillows;Skin to skin;Position options;Breast massage;Hand express;Breast  compression  Lactation Tools Discussed/Used     Consult Status Consult Status: Follow-up Date: 02/03/19 Follow-up type: In-patient    Latorie Montesano, Elta Guadeloupe 02/02/2019, 1:26 AM

## 2019-02-04 ENCOUNTER — Ambulatory Visit: Payer: Medicaid Other | Attending: Obstetrics and Gynecology | Admitting: Physical Therapy

## 2019-02-04 ENCOUNTER — Encounter: Payer: Medicaid Other | Admitting: Physical Therapy

## 2019-02-07 ENCOUNTER — Encounter: Payer: Medicaid Other | Admitting: Obstetrics

## 2019-02-09 ENCOUNTER — Inpatient Hospital Stay (HOSPITAL_COMMUNITY): Admission: AD | Admit: 2019-02-09 | Payer: Medicaid Other | Source: Home / Self Care

## 2019-02-14 ENCOUNTER — Encounter: Payer: Medicaid Other | Admitting: Nurse Practitioner

## 2019-02-15 ENCOUNTER — Encounter (HOSPITAL_COMMUNITY): Payer: Self-pay | Admitting: *Deleted

## 2019-02-15 ENCOUNTER — Other Ambulatory Visit: Payer: Self-pay

## 2019-02-15 ENCOUNTER — Inpatient Hospital Stay (HOSPITAL_COMMUNITY)
Admission: AD | Admit: 2019-02-15 | Discharge: 2019-02-17 | DRG: 776 | Disposition: A | Discharge status: Home / Self Care | Payer: Medicaid Other | Attending: Family Medicine | Admitting: Family Medicine

## 2019-02-15 ENCOUNTER — Telehealth: Payer: Self-pay | Admitting: *Deleted

## 2019-02-15 DIAGNOSIS — O1495 Unspecified pre-eclampsia, complicating the puerperium: Secondary | ICD-10-CM | POA: Diagnosis present

## 2019-02-15 DIAGNOSIS — O1415 Severe pre-eclampsia, complicating the puerperium: Principal | ICD-10-CM | POA: Diagnosis present

## 2019-02-15 DIAGNOSIS — Z20828 Contact with and (suspected) exposure to other viral communicable diseases: Secondary | ICD-10-CM | POA: Diagnosis present

## 2019-02-15 LAB — SARS CORONAVIRUS 2 (TAT 6-24 HRS): SARS Coronavirus 2: NEGATIVE

## 2019-02-15 LAB — COMPREHENSIVE METABOLIC PANEL
ALT: 18 U/L (ref 0–44)
AST: 17 U/L (ref 15–41)
Albumin: 3.6 g/dL (ref 3.5–5.0)
Alkaline Phosphatase: 100 U/L (ref 38–126)
Anion gap: 8 (ref 5–15)
BUN: 12 mg/dL (ref 6–20)
CO2: 24 mmol/L (ref 22–32)
Calcium: 9 mg/dL (ref 8.9–10.3)
Chloride: 107 mmol/L (ref 98–111)
Creatinine, Ser: 0.66 mg/dL (ref 0.44–1.00)
GFR calc Af Amer: >60 mL/min (ref >60)
GFR calc non Af Amer: >60 mL/min (ref >60)
Glucose, Bld: 92 mg/dL (ref 70–99)
Potassium: 3.6 mmol/L (ref 3.5–5.1)
Sodium: 139 mmol/L (ref 135–145)
Total Bilirubin: 0.6 mg/dL (ref 0.3–1.2)
Total Protein: 6.5 g/dL (ref 6.5–8.1)

## 2019-02-15 LAB — CBC
HCT: 37 % (ref 36.0–46.0)
Hemoglobin: 11.7 g/dL — ABNORMAL LOW (ref 12.0–15.0)
MCH: 26.4 pg (ref 26.0–34.0)
MCHC: 31.6 g/dL (ref 30.0–36.0)
MCV: 83.3 fL (ref 80.0–100.0)
Platelets: 263 10*3/uL (ref 150–400)
RBC: 4.44 MIL/uL (ref 3.87–5.11)
RDW: 14.4 % (ref 11.5–15.5)
WBC: 4.6 10*3/uL (ref 4.0–10.5)
nRBC: 0 % (ref 0.0–0.2)

## 2019-02-15 LAB — PROTEIN / CREATININE RATIO, URINE
Creatinine, Urine: 53.41 mg/dL
Total Protein, Urine: <6 mg/dL

## 2019-02-15 MED ORDER — CALCIUM CARBONATE ANTACID 500 MG PO CHEW: 2.0000 | CHEWABLE_TABLET | ORAL | Status: DC | PRN

## 2019-02-15 MED ORDER — NIFEDIPINE 10 MG PO CAPS
20.0000 mg | ORAL_CAPSULE | ORAL | Status: DC | PRN
  Filled 2019-02-15: qty 2

## 2019-02-15 MED ORDER — DOCUSATE SODIUM 100 MG PO CAPS
100.0000 mg | ORAL_CAPSULE | Freq: Every day | ORAL | Status: DC
  Administered 2019-02-16 – 2019-02-17 (×2): 100 mg via ORAL
  Filled 2019-02-15 (×2): qty 1

## 2019-02-15 MED ORDER — AMLODIPINE BESYLATE 5 MG PO TABS
5.0000 mg | ORAL_TABLET | Freq: Every day | ORAL | Status: DC
  Administered 2019-02-15: 5 mg via ORAL
  Filled 2019-02-15: qty 1

## 2019-02-15 MED ORDER — DEXAMETHASONE SODIUM PHOSPHATE 10 MG/ML IJ SOLN
10.0000 mg | Freq: Once | INTRAMUSCULAR | Status: AC
  Administered 2019-02-15: 10 mg via INTRAVENOUS
  Filled 2019-02-15: qty 1

## 2019-02-15 MED ORDER — ACETAMINOPHEN 325 MG PO TABS
650.0000 mg | ORAL_TABLET | ORAL | Status: DC | PRN
  Administered 2019-02-16 – 2019-02-17 (×4): 650 mg via ORAL
  Filled 2019-02-15 (×4): qty 2

## 2019-02-15 MED ORDER — MAGNESIUM SULFATE BOLUS VIA INFUSION
4.0000 g | Freq: Once | INTRAVENOUS | Status: AC
  Administered 2019-02-15: 4 g via INTRAVENOUS
  Filled 2019-02-15: qty 1000

## 2019-02-15 MED ORDER — AMLODIPINE BESYLATE 10 MG PO TABS
10.0000 mg | ORAL_TABLET | Freq: Every day | ORAL | Status: DC
  Administered 2019-02-16 – 2019-02-17 (×2): 10 mg via ORAL
  Filled 2019-02-15 (×2): qty 1

## 2019-02-15 MED ORDER — LABETALOL HCL 5 MG/ML IV SOLN: 40.0000 mg | INTRAVENOUS | Status: DC | PRN

## 2019-02-15 MED ORDER — LACTATED RINGERS IV SOLN
INTRAVENOUS | Status: DC
  Administered 2019-02-15 (×2): via INTRAVENOUS

## 2019-02-15 MED ORDER — MAGNESIUM SULFATE 40 GM/1000ML IV SOLN
2.0000 g/h | INTRAVENOUS | Status: AC
  Administered 2019-02-15 – 2019-02-16 (×2): 2 g/h via INTRAVENOUS
  Filled 2019-02-15: qty 1000

## 2019-02-15 MED ORDER — METOCLOPRAMIDE HCL 5 MG/ML IJ SOLN
10.0000 mg | Freq: Once | INTRAMUSCULAR | Status: AC
  Administered 2019-02-15: 10 mg via INTRAVENOUS
  Filled 2019-02-15: qty 2

## 2019-02-15 MED ORDER — ZOLPIDEM TARTRATE 5 MG PO TABS: 5.0000 mg | ORAL_TABLET | Freq: Every evening | ORAL | Status: DC | PRN

## 2019-02-15 MED ORDER — DIPHENHYDRAMINE HCL 50 MG/ML IJ SOLN
12.5000 mg | Freq: Once | INTRAMUSCULAR | Status: AC
  Administered 2019-02-15: 12.5 mg via INTRAVENOUS
  Filled 2019-02-15: qty 1

## 2019-02-15 MED ORDER — PRENATAL MULTIVITAMIN CH
1.0000 | ORAL_TABLET | Freq: Every day | ORAL | Status: DC
  Filled 2019-02-15: qty 1

## 2019-02-15 MED ORDER — MAGNESIUM SULFATE 40 GM/1000ML IV SOLN
INTRAVENOUS | Status: AC
  Filled 2019-02-15: qty 1000

## 2019-02-15 MED ORDER — NIFEDIPINE 10 MG PO CAPS
10.0000 mg | ORAL_CAPSULE | ORAL | Status: DC | PRN
  Administered 2019-02-15: 10 mg via ORAL

## 2019-02-15 MED ORDER — LACTATED RINGERS IV SOLN: INTRAVENOUS | Status: DC

## 2019-02-15 NOTE — H&P (Signed)
ANTEPARTUM ADMISSION HISTORY AND PHYSICAL NOTE   History of Present Illness: Kelly Rush is a 33 y.o. G3P3003 at 14 days postpartum with complaints of headache and elevated BPs at the dentist office today. She reports that her  BP was 190/107 (second reading was similar). She also reports an on-going headache on the top of her head that feels "inside" her head.  She denies blurry vision, floating spots, sudden swelling, or RUQ pain.   Patient Vitals for the past 24 hrs:  BP Temp Temp src Pulse Resp SpO2  02/15/19 1434 (!) 178/95 - - 66 - -  02/15/19 1400 (!) 164/96 98.2 F (36.8 C) Oral 79 18 100 %       Patient reports  vaginal bleeding as scant staining.   Patient Active Problem List   Diagnosis Date Noted  . Labor and delivery, indication for care 02/01/2019  . [redacted] weeks gestation of pregnancy 02/01/2019  . HSV (herpes simplex virus) infection   . Back pain affecting pregnancy   . Supervision of other normal pregnancy, antepartum 06/04/2014    Past Medical History:  Diagnosis Date  . Anemia   . HSV (herpes simplex virus) infection     No past surgical history on file.  OB History  Gravida Para Term Preterm AB Living  3 3 3     3   SAB TAB Ectopic Multiple Live Births        0 3    # Outcome Date GA Lbr Len/2nd Weight Sex Delivery Anes PTL Lv  3 Term 02/01/19 [redacted]w[redacted]d 08:56 / 02:12 2866 g M Vag-Spont EPI  LIV  2 Term 06/03/14 [redacted]w[redacted]d 07:01 / 01:00 3255 g F Vag-Spont EPI  LIV  1 Term 02/06/08 [redacted]w[redacted]d   M Vag-Spont   LIV    Social History   Socioeconomic History  . Marital status: Single    Spouse name: Not on file  . Number of children: Not on file  . Years of education: Not on file  . Highest education level: Not on file  Occupational History  . Not on file  Social Needs  . Financial resource strain: Not on file  . Food insecurity    Worry: Not on file    Inability: Not on file  . Transportation needs    Medical: Not on file    Non-medical: Not on file   Tobacco Use  . Smoking status: Never Smoker  . Smokeless tobacco: Never Used  Substance and Sexual Activity  . Alcohol use: No  . Drug use: No    Frequency: 3.0 times per week  . Sexual activity: Yes  Lifestyle  . Physical activity    Days per week: Not on file    Minutes per session: Not on file  . Stress: Not on file  Relationships  . Social [redacted]w[redacted]d on phone: Not on file    Gets together: Not on file    Attends religious service: Not on file    Active member of club or organization: Not on file    Attends meetings of clubs or organizations: Not on file    Relationship status: Not on file  Other Topics Concern  . Not on file  Social History Narrative  . Not on file    Family History  Problem Relation Age of Onset  . Hypertension Mother   . Hypertension Father     No Known Allergies  Medications Prior to Admission  Medication Sig Dispense Refill Last  Dose  . Elastic Bandages & Supports (COMFORT FIT MATERNITY SUPP MED) MISC 1 Device by Does not apply route daily. 1 each 0   . ibuprofen (ADVIL) 800 MG tablet Take 1 tablet (800 mg total) by mouth every 8 (eight) hours as needed. 30 tablet 0   . pantoprazole (PROTONIX) 40 MG tablet Take 1 tablet (40 mg total) by mouth daily. (Patient taking differently: Take 40 mg by mouth daily as needed (acid reflux). ) 30 tablet 5   . Prenatal Vit-Fe Phos-FA-Omega (VITAFOL GUMMIES) 3.33-0.333-34.8 MG CHEW Chew 3 tablets by mouth daily before breakfast. 90 tablet 11   . valACYclovir (VALTREX) 1000 MG tablet Take 1 tablet (1,000 mg total) by mouth daily. FOR SUPPRESSION. 30 tablet 11     Review of Systems - Negative except headache. She has tried ibuprofen and tylenol and nothing has helped.   Vitals:  BP (!) 164/96 (BP Location: Right Arm) Comment: pt breastfeeding  Pulse 79   Temp 98.2 F (36.8 C) (Oral)   Resp 18   SpO2 100%   Breastfeeding Yes Comment: doing really good Physical Examination: CONSTITUTIONAL:  Well-developed, well-nourished female in no acute distress.  HENT:  Normocephalic, atraumatic, External right and left ear normal. Oropharynx is clear and moist EYES: Conjunctivae and EOM are normal. Pupils are equal, round, and reactive to light. No scleral icterus.  NECK: Normal range of motion, supple, no masses SKIN: Skin is warm and dry. No rash noted. Not diaphoretic. No erythema. No pallor. [place]: Alert and oriented to person, place, and time. Normal reflexes, muscle tone coordination. No cranial nerve deficit noted. PSYCHIATRIC: Normal mood and affect. Normal behavior. Normal judgment and thought content. CARDIOVASCULAR: Normal heart rate noted, regular rhythm RESPIRATORY: Effort and breath sounds normal, no problems with respiration noted ABDOMEN: Soft, nontender, nondistended, gravid. MUSCULOSKELETAL: Normal range of motion. No edema and no tenderness. 2+ distal pulses.  Labs:  No results found for this or any previous visit (from the past 24 hour(s)).  Imaging Studies: No results found.   Assessment and Plan: Patient Active Problem List   Diagnosis Date Noted  . Labor and delivery, indication for care 02/01/2019  . [redacted] weeks gestation of pregnancy 02/01/2019  . HSV (herpes simplex virus) infection   . Back pain affecting pregnancy   . Supervision of other normal pregnancy, antepartum 06/04/2014    -IV was started, labs drawn and Dr. Rip Harbour immediately notified upon arrival in MAU.  -Start MgSo4 bolus started, plus HA cocktail. -Repeat Cepheid Covid test. -start on Norvasc 10 mg. -Consult with Dr. Rip Harbour, will admit to [facility], CNM

## 2019-02-15 NOTE — Telephone Encounter (Signed)
Lft a vmail for patient informing her that I spoke with Nurse and she is to go to the MAU to be seen for her Blood pressure issues with the headaches. Also informed her I cancelled the nurse vistt previously schedule.Marland KitchenMarland Kitchen

## 2019-02-15 NOTE — MAU Note (Signed)
Was supposed to have teeth pulled today, they checked her BP 3 times, it was really elevated and they would not pull her teeth.  She called her dr and they had her to come here. No BP problems with preg.  SVD on 10/02.  Has a HA all wk, was taking the  800mg  Ibuprofen- kind of dulls.  Yesterday started having little pains in her chest, she thought it was anxiety. Denies visual changes or increase in swelling, denies RUQ pain.

## 2019-02-16 ENCOUNTER — Observation Stay (HOSPITAL_COMMUNITY): Payer: Medicaid Other

## 2019-02-16 MED ORDER — DEXAMETHASONE SODIUM PHOSPHATE 10 MG/ML IJ SOLN
10.0000 mg | Freq: Once | INTRAMUSCULAR | Status: AC
  Administered 2019-02-16: 10 mg via INTRAVENOUS
  Filled 2019-02-16: qty 1

## 2019-02-16 MED ORDER — METOCLOPRAMIDE HCL 5 MG/ML IJ SOLN
10.0000 mg | Freq: Once | INTRAMUSCULAR | Status: AC
  Administered 2019-02-16: 10 mg via INTRAVENOUS
  Filled 2019-02-16: qty 2

## 2019-02-16 MED ORDER — DIPHENHYDRAMINE HCL 50 MG/ML IJ SOLN
12.5000 mg | Freq: Once | INTRAMUSCULAR | Status: AC
  Administered 2019-02-16: 12.5 mg via INTRAVENOUS
  Filled 2019-02-16: qty 1

## 2019-02-16 MED ORDER — CYCLOBENZAPRINE HCL 10 MG PO TABS
10.0000 mg | ORAL_TABLET | Freq: Once | ORAL | Status: AC
  Administered 2019-02-16: 10 mg via ORAL
  Filled 2019-02-16: qty 1

## 2019-02-16 NOTE — Progress Notes (Signed)
Post Partum Day #15 Subjective: Pt still has headache, responded to reglan, decadron yesterday but returned, same level as yesterday  Objective: Blood pressure 118/74, pulse 95, temperature 98.7 F (37.1 C), temperature source Oral, resp. rate 18, SpO2 98 %, currently breastfeeding. Vitals:   02/15/19 2100 02/15/19 2200 02/15/19 2300 02/16/19 0100  BP: 128/81 126/76 119/69 118/74  Pulse: (!) 102 (!) 115 97 95  Resp: 18 18 18 18   Temp:      TempSrc:      SpO2: 98% 98% 96% 98%    Physical Exam:  General: alert, cooperative and no distress Lochia: appropriate Uterine Fundus: firm Incision:  DVT Evaluation: No evidence of DVT seen on physical exam.  Recent Labs    02/15/19 1423  HGB 11.7*  HCT 37.0    Assessment/Plan: 15 days postpartum with atypical pp pre eclampsia/hypertensive presentation  Magnesium to be stopped today at 1500 BP are excellent on just norvasc 10 mg no more IV meds needed Headache persistent if continues after magnesium is cleared consider anesthesia consult to evaluate for atypical post epidural headache, symptoms are not positional at this time   LOS: 1 day   Florian Buff 02/16/2019, 7:21 AM

## 2019-02-16 NOTE — Progress Notes (Signed)
Pt reports minimum relief in regards to her headache. Decadron, Benadryl, Reglan given. Pt next on list to go to [place]. Toya Smothers, RN

## 2019-02-17 ENCOUNTER — Other Ambulatory Visit: Payer: Self-pay | Admitting: Advanced Practice Midwife

## 2019-02-17 ENCOUNTER — Other Ambulatory Visit: Payer: Self-pay | Admitting: Obstetrics & Gynecology

## 2019-02-17 DIAGNOSIS — R519 Headache, unspecified: Secondary | ICD-10-CM | POA: Diagnosis not present

## 2019-02-17 DIAGNOSIS — O1415 Severe pre-eclampsia, complicating the puerperium: Secondary | ICD-10-CM | POA: Diagnosis not present

## 2019-02-17 DIAGNOSIS — Z20828 Contact with and (suspected) exposure to other viral communicable diseases: Secondary | ICD-10-CM | POA: Diagnosis present

## 2019-02-17 MED ORDER — AMLODIPINE BESYLATE 10 MG PO TABS: 10.0000 mg | ORAL_TABLET | Freq: Every day | ORAL | 1 refills | Status: DC

## 2019-02-17 MED ORDER — BUTALBITAL-APAP-CAFFEINE 50-325-40 MG PO TABS: 1.0000 | ORAL_TABLET | Freq: Four times a day (QID) | ORAL | 0 refills | Status: DC | PRN

## 2019-02-17 NOTE — Discharge Summary (Signed)
Physician Discharge Summary  Patient ID: Kelly Rush MRN: [id number] DOB/AGE: 11/25/1985 33 y.o.  Admit date: 02/15/2019 Discharge date: 02/17/2019  Admission Diagnoses: Postpartum severe pre eclampsia  Discharge Diagnoses:  Active Problems:   Preeclampsia in postpartum period   Discharged Condition: good  Hospital Course: pt was admitted for BP management and magnesium sulfate therapy.  CT scan was normal, did not show PRES.  Her BP normalized on oral therapy after initial requirement of IV meds.  Additionally her headache improved. She was discharged on Norvasc orally  Consults: None  Significant Diagnostic Studies: CT scan normal  Treatments: magnesium sulfate prophylaxis and anti hypertensive therapy  Discharge Exam: Blood pressure 130/82, pulse 84, temperature 98.3 F (36.8 C), temperature source Oral, resp. rate 17, SpO2 99 %, currently breastfeeding. General appearance: alert, cooperative and no distress GI: soft, non-tender; bowel sounds normal; no masses,  no organomegaly  Disposition: Discharge disposition: 01-Home or Self Care       Discharge Instructions    Ambulatory referral to Lactation   Complete by: As directed    Reason for consult: The Mother-Infant Dyad Needs Assistance in the Continuation of Breastfeeding   Diet - low sodium heart healthy   Complete by: As directed      Allergies as of 02/17/2019   No Known Allergies     Medication List    TAKE these medications   amLODipine 10 MG tablet Commonly known as: NORVASC Take 1 tablet (10 mg total) by mouth daily.   butalbital-acetaminophen-caffeine 50-325-40 MG tablet Commonly known as: FIORICET Take 1-2 tablets by mouth every 6 (six) hours as needed for headache.   Comfort Fit Maternity Supp Med Misc 1 Device by Does not apply route daily.   ibuprofen 800 MG tablet Commonly known as: ADVIL Take 1 tablet (800 mg total) by mouth every 8 (eight) hours as needed.   pantoprazole 40  MG tablet Commonly known as: Protonix Take 1 tablet (40 mg total) by mouth daily. What changed:   when to take this  reasons to take this   valACYclovir 1000 MG tablet Commonly known as: Valtrex Take 1 tablet (1,000 mg total) by mouth daily. FOR SUPPRESSION.   Vitafol Gummies 3.33-0.333-34.8 MG Chew Chew 3 tablets by mouth daily before breakfast.      Follow-up Information    [facility] Follow up on 02/22/2019.   Why: BP check Contact information: [address] [place] [address] [phone]          Signed: Florian Buff 02/17/2019, 8:33 AM

## 2019-02-17 NOTE — Progress Notes (Signed)
Pt discharged home with printed instructions. Pt verbalized an understanding. No concerns noted. Anya Murphey L Rhett Najera, RN 

## 2019-02-17 NOTE — Discharge Instructions (Signed)
Preeclampsia and Eclampsia °Preeclampsia is a serious condition that may develop during pregnancy. This condition causes high blood pressure and increased protein in your urine along with other symptoms, such as headaches and vision changes. These symptoms may develop as the condition gets worse. Preeclampsia may occur at 20 weeks of pregnancy or later. °Diagnosing and treating preeclampsia early is very important. If not treated early, it can cause serious problems for you and your baby. One problem it can lead to is eclampsia. Eclampsia is a condition that causes muscle jerking or shaking (convulsions or seizures) and other serious problems for the mother. During pregnancy, delivering your baby may be the best treatment for preeclampsia or eclampsia. For most women, preeclampsia and eclampsia symptoms go away after giving birth. °In rare cases, a woman may develop preeclampsia after giving birth (postpartum preeclampsia). This usually occurs within 48 hours after childbirth but may occur up to 6 weeks after giving birth. °What are the causes? °The cause of preeclampsia is not known. °What increases the risk? °The following risk factors make you more likely to develop preeclampsia: °· Being pregnant for the first time. °· Having had preeclampsia during a past pregnancy. °· Having a family history of preeclampsia. °· Having high blood pressure. °· Being pregnant with more than one baby. °· Being 35 or older. °· Being African-American. °· Having kidney disease or diabetes. °· Having medical conditions such as lupus or blood diseases. °· Being very overweight (obese). °What are the signs or symptoms? °The most common symptoms are: °· Severe headaches. °· Vision problems, such as blurred or double vision. °· Abdominal pain, especially upper abdominal pain. °Other symptoms that may develop as the condition gets worse include: °· Sudden weight gain. °· Sudden swelling of the hands, face, legs, and feet. °· Severe nausea  and vomiting. °· Numbness in the face, arms, legs, and feet. °· Dizziness. °· Urinating less than usual. °· Slurred speech. °· Convulsions or seizures. °How is this diagnosed? °There are no screening tests for preeclampsia. Your health care provider will ask you about symptoms and check for signs of preeclampsia during your prenatal visits. You may also have tests that include: °· Checking your blood pressure. °· Urine tests to check for protein. Your health care provider will check for this at every prenatal visit. °· Blood tests. °· Monitoring your baby's heart rate. °· Ultrasound. °How is this treated? °You and your health care provider will determine the treatment approach that is best for you. Treatment may include: °· Having more frequent prenatal exams to check for signs of preeclampsia, if you have an increased risk for preeclampsia. °· Medicine to lower your blood pressure. °· Staying in the hospital, if your condition is severe. There, treatment will focus on controlling your blood pressure and the amount of fluids in your body (fluid retention). °· Taking medicine (magnesium sulfate) to prevent seizures. This may be given as an injection or through an IV. °· Taking a low-dose aspirin during your pregnancy. °· Delivering your baby early. You may have your labor started with medicine (induced), or you may have a cesarean delivery. °Follow these instructions at home: °Eating and drinking ° °· Drink enough fluid to keep your urine pale yellow. °· Avoid caffeine. °Lifestyle °· Do not use any products that contain nicotine or tobacco, such as cigarettes and e-cigarettes. If you need help quitting, ask your health care provider. °· Do not use alcohol or drugs. °· Avoid stress as much as possible. Rest and get   plenty of sleep. °General instructions °· Take over-the-counter and prescription medicines only as told by your health care provider. °· When lying down, lie on your left side. This keeps pressure off your  major blood vessels. °· When sitting or lying down, raise (elevate) your feet. Try putting some pillows underneath your lower legs. °· Exercise regularly. Ask your health care provider what kinds of exercise are best for you. °· Keep all follow-up and prenatal visits as told by your health care provider. This is important. °How is this prevented? °There is no known way of preventing preeclampsia or eclampsia from developing. However, to lower your risk of complications and detect problems early: °· Get regular prenatal care. Your health care provider may be able to diagnose and treat the condition early. °· Maintain a healthy weight. Ask your health care provider for help managing weight gain during pregnancy. °· Work with your health care provider to manage any long-term (chronic) health conditions you have, such as diabetes or kidney problems. °· You may have tests of your blood pressure and kidney function after giving birth. °· Your health care provider may have you take low-dose aspirin during your next pregnancy. °Contact a health care provider if: °· You have symptoms that your health care provider told you may require more treatment or monitoring, such as: °? Headaches. °? Nausea or vomiting. °? Abdominal pain. °? Dizziness. °? Light-headedness. °Get help right away if: °· You have severe: °? Abdominal pain. °? Headaches that do not get better. °? Dizziness. °? Vision problems. °? Confusion. °? Nausea or vomiting. °· You have any of the following: °? A seizure. °? Sudden, rapid weight gain. °? Sudden swelling in your hands, ankles, or face. °? Trouble moving any part of your body. °? Numbness in any part of your body. °? Trouble speaking. °? Abnormal bleeding. °· You faint. °Summary °· Preeclampsia is a serious condition that may develop during pregnancy. °· This condition causes high blood pressure and increased protein in your urine along with other symptoms, such as headaches and vision  changes. °· Diagnosing and treating preeclampsia early is very important. If not treated early, it can cause serious problems for you and your baby. °· Get help right away if you have symptoms that your health care provider told you to watch for. °This information is not intended to replace advice given to you by your health care provider. Make sure you discuss any questions you have with your health care provider. °Document Released: 04/15/2000 Document Revised: 12/19/2017 Document Reviewed: 11/23/2015 °Elsevier Patient Education © 2020 [employer] ° °

## 2019-02-20 ENCOUNTER — Ambulatory Visit: Payer: Medicaid Other

## 2019-02-22 ENCOUNTER — Inpatient Hospital Stay (HOSPITAL_COMMUNITY)
Admission: AD | Admit: 2019-02-22 | Discharge: 2019-02-22 | Disposition: A | Discharge status: Home / Self Care | Payer: Medicaid Other | Attending: Obstetrics & Gynecology | Admitting: Obstetrics & Gynecology

## 2019-02-22 ENCOUNTER — Encounter (HOSPITAL_COMMUNITY): Payer: Self-pay

## 2019-02-22 ENCOUNTER — Other Ambulatory Visit: Payer: Self-pay

## 2019-02-22 ENCOUNTER — Ambulatory Visit (INDEPENDENT_AMBULATORY_CARE_PROVIDER_SITE_OTHER): Payer: Medicaid Other

## 2019-02-22 DIAGNOSIS — H538 Other visual disturbances: Secondary | ICD-10-CM | POA: Insufficient documentation

## 2019-02-22 DIAGNOSIS — Z3480 Encounter for supervision of other normal pregnancy, unspecified trimester: Secondary | ICD-10-CM

## 2019-02-22 DIAGNOSIS — O9089 Other complications of the puerperium, not elsewhere classified: Secondary | ICD-10-CM | POA: Insufficient documentation

## 2019-02-22 DIAGNOSIS — R519 Headache, unspecified: Secondary | ICD-10-CM | POA: Diagnosis not present

## 2019-02-22 DIAGNOSIS — O1495 Unspecified pre-eclampsia, complicating the puerperium: Secondary | ICD-10-CM | POA: Insufficient documentation

## 2019-02-22 DIAGNOSIS — Z8249 Family history of ischemic heart disease and other diseases of the circulatory system: Secondary | ICD-10-CM | POA: Insufficient documentation

## 2019-02-22 LAB — URINALYSIS, ROUTINE W REFLEX MICROSCOPIC
Bilirubin Urine: NEGATIVE
Glucose, UA: NEGATIVE mg/dL
Ketones, ur: NEGATIVE mg/dL
Leukocytes,Ua: NEGATIVE
Nitrite: NEGATIVE
Protein, ur: NEGATIVE mg/dL
Specific Gravity, Urine: 1.015 (ref 1.005–1.030)
pH: 5 (ref 5.0–8.0)

## 2019-02-22 LAB — CBC WITH DIFFERENTIAL/PLATELET
Abs Immature Granulocytes: 0.01 10*3/uL (ref 0.00–0.07)
Basophils Absolute: 0 10*3/uL (ref 0.0–0.1)
Basophils Relative: 0 %
Eosinophils Absolute: 0.1 10*3/uL (ref 0.0–0.5)
Eosinophils Relative: 2 %
HCT: 37.9 % (ref 36.0–46.0)
Hemoglobin: 11.6 g/dL — ABNORMAL LOW (ref 12.0–15.0)
Immature Granulocytes: 0 %
Lymphocytes Relative: 37 %
Lymphs Abs: 1.8 10*3/uL (ref 0.7–4.0)
MCH: 25.6 pg — ABNORMAL LOW (ref 26.0–34.0)
MCHC: 30.6 g/dL (ref 30.0–36.0)
MCV: 83.5 fL (ref 80.0–100.0)
Monocytes Absolute: 0.4 10*3/uL (ref 0.1–1.0)
Monocytes Relative: 8 %
Neutro Abs: 2.6 10*3/uL (ref 1.7–7.7)
Neutrophils Relative %: 53 %
Platelets: 300 10*3/uL (ref 150–400)
RBC: 4.54 MIL/uL (ref 3.87–5.11)
RDW: 14.7 % (ref 11.5–15.5)
WBC: 4.9 10*3/uL (ref 4.0–10.5)
nRBC: 0 % (ref 0.0–0.2)

## 2019-02-22 LAB — PROTEIN / CREATININE RATIO, URINE
Creatinine, Urine: 115.14 mg/dL
Total Protein, Urine: <6 mg/dL

## 2019-02-22 LAB — COMPREHENSIVE METABOLIC PANEL
ALT: 17 U/L (ref 0–44)
AST: 16 U/L (ref 15–41)
Albumin: 3.6 g/dL (ref 3.5–5.0)
Alkaline Phosphatase: 96 U/L (ref 38–126)
Anion gap: 9 (ref 5–15)
BUN: 8 mg/dL (ref 6–20)
CO2: 26 mmol/L (ref 22–32)
Calcium: 9.6 mg/dL (ref 8.9–10.3)
Chloride: 105 mmol/L (ref 98–111)
Creatinine, Ser: 0.64 mg/dL (ref 0.44–1.00)
GFR calc Af Amer: >60 mL/min (ref >60)
GFR calc non Af Amer: >60 mL/min (ref >60)
Glucose, Bld: 108 mg/dL — ABNORMAL HIGH (ref 70–99)
Potassium: 3.5 mmol/L (ref 3.5–5.1)
Sodium: 140 mmol/L (ref 135–145)
Total Bilirubin: 0.4 mg/dL (ref 0.3–1.2)
Total Protein: 6.9 g/dL (ref 6.5–8.1)

## 2019-02-22 MED ORDER — KETOROLAC TROMETHAMINE 60 MG/2ML IM SOLN
60.0000 mg | Freq: Once | INTRAMUSCULAR | Status: AC
  Administered 2019-02-22: 60 mg via INTRAMUSCULAR
  Filled 2019-02-22: qty 2

## 2019-02-22 MED ORDER — CYCLOBENZAPRINE HCL 10 MG PO TABS
10.0000 mg | ORAL_TABLET | Freq: Once | ORAL | Status: AC
  Administered 2019-02-22: 10 mg via ORAL
  Filled 2019-02-22: qty 1

## 2019-02-22 MED ORDER — CYCLOBENZAPRINE HCL 10 MG PO TABS: 10.0000 mg | ORAL_TABLET | Freq: Three times a day (TID) | ORAL | 0 refills | Status: DC | PRN

## 2019-02-22 NOTE — Progress Notes (Signed)
I connected with  Kelly Rush on 02/22/19 by a video enabled telemedicine application and verified that I am speaking with the correct person using two identifiers.   I discussed the limitations of evaluation and management by telemedicine. The patient expressed understanding and agreed to proceed.  Subjective:  Kelly Rush is a 33 y.o. female here for BP check.   Hypertension ROS:c/o headaches 4-8/10, taking medications as instructed, no medication side effects noted, no TIA's, no chest pain on exertion, no dyspnea on exertion and no swelling of ankles.    Objective:  BP (!) 141/92 (BP Location: Left Arm)   Pulse 80   Ht 5\' 2"  (1.575 m)   LMP  (LMP Unknown)   Breastfeeding Yes   BMI 32.65 kg/m   Appearance alert, well appearing, and in no distress and oriented to person, place, and time. General exam BP noted to be well controlled today in office.    Assessment:   Blood Pressure control uncertain. C/o headaches and dizziness.  Plan:  Per Dr. Jodi Mourning the patient was advised to go to the Hospital because the headaches were contant, Rx does not help.Marland Kitchen

## 2019-02-22 NOTE — MAU Note (Signed)
Pt has hx of PP Pre-E. Delivered 02/01/2019. Was admitted to [facility] for [place], d/c'd on Sunday. Was checking BP at home. Today was 141/98, and has headache 5/10. Tried Tylenol with no relief.  Was told to come in for eval. Currently has headache, slight blurry vision, denies swelling.

## 2019-02-22 NOTE — MAU Provider Note (Signed)
History     CSN: [id number]  Arrival date and time: 02/22/19 1120    First Provider Initiated Contact with Patient 02/22/19 1225      Chief Complaint  Patient presents with  . Hypertension  . Headache   HPI  Kelly Rush is a 33 yo F [id number] s/p SVD on 02/01/19 and 14 days post partum for headaches and elevated BPS with vision changes consistent wit post partum pre-eclamspsia. She presents presents with elevated BP and headache refractory to medications (fioricet, ibuprofen 800mg ) with occasional blurring of vision, however, vision changes not always associated with headache. Patient reports the headaches have been intermittent and with 5/10 pain. She has not had any N/V, chest pain, arm pain or palpitations. She recently had dental work w 2 extractions mandibular and maxillary biscuspid (right side) and wonders if the resurgence of headaches are due to the procedure. The patient also notes that post dental procedure diet consists mostly of soups. She has also been drinking 64-80 fl oz of gatorade daily but not a lot of water. Patient presented to clinic with BP elevated, however, the patient had not taken her amlodipine prior to high BP and teleheath meeting with nursing staff. Since the patient took her amlodipine her BP has remained within acceptable range.     Past Medical History:  Diagnosis Date  . Anemia   . HSV (herpes simplex virus) infection     History reviewed. No pertinent surgical history.  Family History  Problem Relation Age of Onset  . Hypertension Mother   . Hypertension Father     Social History   Tobacco Use  . Smoking status: Never Smoker  . Smokeless tobacco: Never Used  Substance Use Topics  . Alcohol use: No  . Drug use: No    Frequency: 3.0 times per week    Allergies: No Known Allergies  No medications prior to admission.    Review of Systems  + HA Occasional vision changes   Physical Exam   Blood pressure 121/87, pulse 86,  temperature 98.3 F (36.8 C), temperature source Oral, resp. rate 16, height 5\' 2"  (1.575 m), weight 164 lb 4.8 oz (74.5 kg), SpO2 100 %, currently breastfeeding.   Patient Vitals for the past 24 hrs:  BP Temp Temp src Pulse Resp SpO2 Height Weight  02/22/19 1550 - - - - 16 - - -  02/22/19 1530 121/87 - - 86 - - - -  02/22/19 1515 121/79 - - 87 - - - -  02/22/19 1500 119/73 - - 89 - - - -  02/22/19 1445 113/75 - - 86 - - - -  02/22/19 1430 121/79 - - 88 - - - -  02/22/19 1415 120/85 - - 89 - - - -  02/22/19 1400 124/88 - - 90 - - - -  02/22/19 1345 124/84 - - 90 - - - -  02/22/19 1330 121/82 - - 79 - - - -  02/22/19 1315 127/79 - - 83 - - - -  02/22/19 1300 125/79 - - 90 - - - -  02/22/19 1230 131/80 - - 92 - - - -  02/22/19 1215 122/80 - - 93 - - - -  02/22/19 1205 128/81 - - 93 - - - -  02/22/19 1149 126/72 98.3 F (36.8 C) Oral 96 16 100 % 5\' 2"  (1.575 m) 164 lb 4.8 oz (74.5 kg)    Physical Exam  Constitutional: She is oriented to person, place, and time  and well-developed, well-nourished, and in no distress.  HENT:  Head: Normocephalic.  Eyes: Pupils are equal, round, and reactive to light. No scleral icterus.  Neck: Normal range of motion.  Cardiovascular: Normal rate, regular rhythm and normal heart sounds.  Respiratory: Effort normal and breath sounds normal.  GI: Soft. She exhibits no mass. There is no abdominal tenderness. There is no rebound and no guarding.  Musculoskeletal: Normal range of motion.        General: No edema.  Neurological: She is oriented to person, place, and time.  Skin: Skin is warm and dry. There is erythema.    Physical Exam  Constitutional: She is oriented to person, place, and time and well-developed, well-nourished, and in no distress.  HENT:  Head: Normocephalic.  Eyes: Pupils are equal, round, and reactive to light. No scleral icterus.  Neck: Normal range of motion.  Cardiovascular: Normal rate, regular rhythm and normal heart sounds.   Pulmonary/Chest: Effort normal and breath sounds normal.  Abdominal: Soft. She exhibits no mass. There is no abdominal tenderness. There is no rebound and no guarding.  Musculoskeletal: Normal range of motion.        General: No edema.  Neurological: She is oriented to person, place, and time.  Skin: Skin is warm and dry. There is erythema.  GU-------   MAU Course  Procedures  MDM CBC    Component Value Date/Time   WBC 4.9 02/22/2019 1302   RBC 4.54 02/22/2019 1302   HGB 11.6 (L) 02/22/2019 1302   HGB 10.6 (L) 11/12/2018 0849   HCT 37.9 02/22/2019 1302   HCT 31.8 (L) 11/12/2018 0849   PLT 300 02/22/2019 1302   PLT 205 11/12/2018 0849   MCV 83.5 02/22/2019 1302   MCV 89 11/12/2018 0849   MCH 25.6 (L) 02/22/2019 1302   MCHC 30.6 02/22/2019 1302   RDW 14.7 02/22/2019 1302   RDW 12.9 11/12/2018 0849   LYMPHSABS 1.8 02/22/2019 1302   LYMPHSABS 1.5 07/23/2018 0919   MONOABS 0.4 02/22/2019 1302   EOSABS 0.1 02/22/2019 1302   EOSABS 0.0 07/23/2018 0919   BASOSABS 0.0 02/22/2019 1302   BASOSABS 0.0 07/23/2018 0919   CMP     Component Value Date/Time   NA 140 02/22/2019 1302   K 3.5 02/22/2019 1302   CL 105 02/22/2019 1302   CO2 26 02/22/2019 1302   GLUCOSE 108 (H) 02/22/2019 1302   BUN 8 02/22/2019 1302   CREATININE 0.64 02/22/2019 1302   CALCIUM 9.6 02/22/2019 1302   PROT 6.9 02/22/2019 1302   ALBUMIN 3.6 02/22/2019 1302   AST 16 02/22/2019 1302   ALT 17 02/22/2019 1302   ALKPHOS 96 02/22/2019 1302   BILITOT 0.4 02/22/2019 1302   GFRNONAA >60 02/22/2019 1302   GFRAA >60 02/22/2019 1302   Urinalysis    Component Value Date/Time   COLORURINE YELLOW 02/22/2019 1209   APPEARANCEUR CLEAR 02/22/2019 1209   LABSPEC 1.015 02/22/2019 1209   PHURINE 5.0 02/22/2019 1209   GLUCOSEU NEGATIVE 02/22/2019 1209   HGBUR MODERATE (A) 02/22/2019 1209   Comfort 02/22/2019 [place] 02/22/2019 1209   PROTEINUR NEGATIVE 02/22/2019 1209    UROBILINOGEN 0.2 03/12/2014 2008   NITRITE NEGATIVE 02/22/2019 1209   LEUKOCYTESUR NEGATIVE 02/22/2019 1209   Results for orders placed or performed during the hospital encounter of 02/22/19 (from the past 48 hour(s))  Urinalysis, Routine w reflex microscopic     Status: Abnormal   Collection Time: 02/22/19 12:09 PM  Result Value Ref Range   Color, Urine YELLOW YELLOW   APPearance CLEAR CLEAR   Specific Gravity, Urine 1.015 1.005 - 1.030   pH 5.0 5.0 - 8.0   Glucose, UA NEGATIVE NEGATIVE mg/dL   Hgb urine dipstick MODERATE (A) NEGATIVE   Bilirubin Urine NEGATIVE NEGATIVE   Ketones, ur NEGATIVE NEGATIVE mg/dL   Protein, ur NEGATIVE NEGATIVE mg/dL   Nitrite NEGATIVE NEGATIVE   Leukocytes,Ua NEGATIVE NEGATIVE   RBC / HPF 6-10 0 - 5 RBC/hpf   WBC, UA 0-5 0 - 5 WBC/hpf   Bacteria, UA RARE (A) NONE SEEN   Squamous Epithelial / LPF 0-5 0 - 5   Mucus PRESENT    Hyaline Casts, UA PRESENT     Comment: Performed at [facility], [address] [place], [place] [address]  Protein / creatinine ratio, urine     Status: None   Collection Time: 02/22/19 12:53 PM  Result Value Ref Range   Creatinine, Urine 115.14 mg/dL   Total Protein, Urine <6 mg/dL    Comment: NO NORMAL RANGE ESTABLISHED FOR THIS TEST   Protein Creatinine Ratio        0.00 - 0.15 mg/mg[Cre]    Comment: RESULT BELOW REPORTABLE RANGE, UNABLE TO CALCULATE. Performed at [facility], [address] [place], [place] [address]   CBC with Differential/Platelet     Status: Abnormal   Collection Time: 02/22/19  1:02 PM  Result Value Ref Range   WBC 4.9 4.0 - 10.5 K/uL   RBC 4.54 3.87 - 5.11 MIL/uL   Hemoglobin 11.6 (L) 12.0 - 15.0 g/dL   HCT 41.337.9 24.436.0 - 01.046.0 %   MCV 83.5 80.0 - 100.0 fL   MCH 25.6 (L) 26.0 - 34.0 pg   MCHC 30.6 30.0 - 36.0 g/dL   RDW 27.214.7 53.611.5 - 64.415.5 %   Platelets 300 150 - 400 K/uL   nRBC 0.0 0.0 - 0.2 %   Neutrophils Relative % 53 %   Neutro Abs 2.6 1.7 - 7.7 K/uL   Lymphocytes Relative 37  %   Lymphs Abs 1.8 0.7 - 4.0 K/uL   Monocytes Relative 8 %   Monocytes Absolute 0.4 0.1 - 1.0 K/uL   Eosinophils Relative 2 %   Eosinophils Absolute 0.1 0.0 - 0.5 K/uL   Basophils Relative 0 %   Basophils Absolute 0.0 0.0 - 0.1 K/uL   Immature Granulocytes 0 %   Abs Immature Granulocytes 0.01 0.00 - 0.07 K/uL    Comment: Performed at [facility], [address] [place], [place] [address]  Comprehensive metabolic panel     Status: Abnormal   Collection Time: 02/22/19  1:02 PM  Result Value Ref Range   Sodium 140 135 - 145 mmol/L   Potassium 3.5 3.5 - 5.1 mmol/L   Chloride 105 98 - 111 mmol/L   CO2 26 22 - 32 mmol/L   Glucose, Bld 108 (H) 70 - 99 mg/dL   BUN 8 6 - 20 mg/dL   Creatinine, Ser 2.590.64 0.44 - 1.00 mg/dL   Calcium 9.6 8.9 - 56.310.3 mg/dL   Total Protein 6.9 6.5 - 8.1 g/dL   Albumin 3.6 3.5 - 5.0 g/dL   AST 16 15 - 41 U/L   ALT 17 0 - 44 U/L   Alkaline Phosphatase 96 38 - 126 U/L   Total Bilirubin 0.4 0.3 - 1.2 mg/dL   GFR calc non Af Amer >60 >60 mL/min   GFR  calc Af Amer >60 >60 mL/min   Anion gap 9 5 - 15    Comment: Performed at [facility], [address] [place], [place] [address]    Assessment and Plan  Due to patient history of post partum pre eclamspia which was treated effectively with Magnesium and out patient amlodipine, pre-eclamspia work up initiated. Based on history and physical exam, it was likely dehydration and secondary pain due to dental procedure and patient's history of migraine HA.  Pre eclamspia has been ruled out due to multiple normal range BP while patient was roomed and normal lab results. HA improved.   Headaches: Based on history of headaches refractory to current acet-butab-caff and ibuprofen use prn, medication changed to cyclobenzaprine. Pt has not had HTN since roomed. Pt treatment with cyclobenzaprine and toradol has improved pt headache and decreased from 7/10 to 3-4/10 pain.    Victorino Dike Rasch 02/22/2019, 3:59 PM     I confirm that I have verified the information documented in the medical student's note and that I have also personally reperformed the history, physical exam and all medical decision making activities of this service and have verified that all service and findings are accurately documented in this student's note.   Discussed patient with Dr. Macon Large. RX for flexeril. Strict return precautions.   Duane Lope, NP 02/22/2019 4:01 PM

## 2019-02-22 NOTE — Discharge Instructions (Signed)
General Headache Without Cause °A headache is pain or discomfort that is felt around the head or neck area. There are many causes and types of headaches. In some cases, the cause may not be found. °Follow these instructions at home: °Watch your condition for any changes. Let your doctor know about them. Take these steps to help with your condition: °Managing pain ° °  ° °· Take over-the-counter and prescription medicines only as told by your doctor. °· Lie down in a dark, quiet room when you have a headache. °· If told, put ice on your head and neck area: °? Put ice in a plastic bag. °? Place a towel between your skin and the bag. °? Leave the ice on for 20 minutes, 2-3 times per day. °· If told, put heat on the affected area. Use the heat source that your doctor recommends, such as a moist heat pack or a heating pad. °? Place a towel between your skin and the heat source. °? Leave the heat on for 20-30 minutes. °? Remove the heat if your skin turns bright red. This is very important if you are unable to feel pain, heat, or cold. You may have a greater risk of getting burned. °· Keep lights dim if bright lights bother you or make your headaches worse. °Eating and drinking °· Eat meals on a regular schedule. °· If you drink alcohol: °? Limit how much you use to: °§ 0-1 drink a day for women. °§ 0-2 drinks a day for men. °? Be aware of how much alcohol is in your drink. In the [place] one drink equals one 12 oz bottle of beer (355 mL), one 5 oz glass of wine (148 mL), or one 1½ oz glass of hard liquor (44 mL). °· Stop drinking caffeine, or reduce how much caffeine you drink. °General instructions ° °· Keep a journal to find out if certain things bring on headaches. For example, write down: °? What you eat and drink. °? How much sleep you get. °? Any change to your diet or medicines. °· Get a massage or try other ways to relax. °· Limit stress. °· Sit up straight. Do not tighten (tense) your muscles. °· Do not use any  products that contain nicotine or tobacco. This includes cigarettes, e-cigarettes, and chewing tobacco. If you need help quitting, ask your doctor. °· Exercise regularly as told by your doctor. °· Get enough sleep. This often means 7-9 hours of sleep each night. °· Keep all follow-up visits as told by your doctor. This is important. °Contact a doctor if: °· Your symptoms are not helped by medicine. °· You have a headache that feels different than the other headaches. °· You feel sick to your stomach (nauseous) or you throw up (vomit). °· You have a fever. °Get help right away if: °· Your headache gets very bad quickly. °· Your headache gets worse after a lot of physical activity. °· You keep throwing up. °· You have a stiff neck. °· You have trouble seeing. °· You have trouble speaking. °· You have pain in the eye or ear. °· Your muscles are weak or you lose muscle control. °· You lose your balance or have trouble walking. °· You feel like you will pass out (faint) or you pass out. °· You are mixed up (confused). °· You have a seizure. °Summary °· A headache is pain or discomfort that is felt around the head or neck area. °· There are many causes and   types of headaches. In some cases, the cause may not be found. °· Keep a journal to help find out what causes your headaches. Watch your condition for any changes. Let your doctor know about them. °· Contact a doctor if you have a headache that is different from usual, or if your headache is not helped by medicine. °· Get help right away if your headache gets very bad, you throw up, you have trouble seeing, you lose your balance, or you have a seizure. °This information is not intended to replace advice given to you by your health care provider. Make sure you discuss any questions you have with your health care provider. °Document Released: 01/26/2008 Document Revised: 11/06/2017 Document Reviewed: 11/06/2017 °[employer]vier Patient Education © 2020 [employer] ° °

## 2019-02-22 NOTE — Progress Notes (Addendum)
History     CSN: [id number]  Arrival date and time: 02/22/19 1120    First Provider Initiated Contact with Patient 02/22/19 1225      Chief Complaint  Patient presents with  . Hypertension  . Headache   HPI  Kelly Rush is a 33 yo F G3P3003 s/p SVD on 02/01/19 and 14 days post partum for headaches and elevated BPS with vision changes consistent wit post partum pre-eclamspsia. She presents presents with elevated BP and headache refractory to medications (fioricet, ibuprofen 800mg ) with occasional blurring of vision, however, vision changes not always associated with headache. Patient reports the headaches have been intermittent and with 5/10 pain. She has not had any N/V, chest pain, arm pain or palpitations. She recently had dental work w 2 extractions mandibular and maxillary biscuspid (right side) and wonders if the resurgence of headaches are due to the procedure. The patient also notes that post dental procedure diet consists mostly of soups. She has also been drinking 64-80 fl oz of gatorade daily but not a lot of water. Patient presented to clinic with BP elevated, however, the patient had not taken her amlodipine prior to high BP and teleheath meeting with nursing staff. Since the patient took her amlodipine her BP has remained within acceptable range.     Past Medical History:  Diagnosis Date  . Anemia   . HSV (herpes simplex virus) infection     History reviewed. No pertinent surgical history.  Family History  Problem Relation Age of Onset  . Hypertension Mother   . Hypertension Father     Social History   Tobacco Use  . Smoking status: Never Smoker  . Smokeless tobacco: Never Used  Substance Use Topics  . Alcohol use: No  . Drug use: No    Frequency: 3.0 times per week    Allergies: No Known Allergies  No medications prior to admission.    Review of Systems  + HA Occasional vision changes   Physical Exam   Blood pressure 121/87, pulse 86,  temperature 98.3 F (36.8 C), temperature source Oral, resp. rate 16, height 5\' 2"  (1.575 m), weight 164 lb 4.8 oz (74.5 kg), SpO2 100 %, currently breastfeeding.   Patient Vitals for the past 24 hrs:  BP Temp Temp src Pulse Resp SpO2 Height Weight  02/22/19 1550 - - - - 16 - - -  02/22/19 1530 121/87 - - 86 - - - -  02/22/19 1515 121/79 - - 87 - - - -  02/22/19 1500 119/73 - - 89 - - - -  02/22/19 1445 113/75 - - 86 - - - -  02/22/19 1430 121/79 - - 88 - - - -  02/22/19 1415 120/85 - - 89 - - - -  02/22/19 1400 124/88 - - 90 - - - -  02/22/19 1345 124/84 - - 90 - - - -  02/22/19 1330 121/82 - - 79 - - - -  02/22/19 1315 127/79 - - 83 - - - -  02/22/19 1300 125/79 - - 90 - - - -  02/22/19 1230 131/80 - - 92 - - - -  02/22/19 1215 122/80 - - 93 - - - -  02/22/19 1205 128/81 - - 93 - - - -  02/22/19 1149 126/72 98.3 F (36.8 C) Oral 96 16 100 % 5\' 2"  (1.575 m) 164 lb 4.8 oz (74.5 kg)    Physical Exam  Constitutional: She is oriented to person, place, and time  and well-developed, well-nourished, and in no distress.  HENT:  Head: Normocephalic.  Eyes: Pupils are equal, round, and reactive to light. No scleral icterus.  Neck: Normal range of motion.  Cardiovascular: Normal rate, regular rhythm and normal heart sounds.  Respiratory: Effort normal and breath sounds normal.  GI: Soft. She exhibits no mass. There is no abdominal tenderness. There is no rebound and no guarding.  Musculoskeletal: Normal range of motion.        General: No edema.  Neurological: She is oriented to person, place, and time.  Skin: Skin is warm and dry. There is erythema.    Physical Exam  Constitutional: She is oriented to person, place, and time and well-developed, well-nourished, and in no distress.  HENT:  Head: Normocephalic.  Eyes: Pupils are equal, round, and reactive to light. No scleral icterus.  Neck: Normal range of motion.  Cardiovascular: Normal rate, regular rhythm and normal heart sounds.   Pulmonary/Chest: Effort normal and breath sounds normal.  Abdominal: Soft. She exhibits no mass. There is no abdominal tenderness. There is no rebound and no guarding.  Musculoskeletal: Normal range of motion.        General: No edema.  Neurological: She is oriented to person, place, and time.  Skin: Skin is warm and dry. There is erythema.  GU-------   MAU Course  Procedures  MDM CBC    Component Value Date/Time   WBC 4.9 02/22/2019 1302   RBC 4.54 02/22/2019 1302   HGB 11.6 (L) 02/22/2019 1302   HGB 10.6 (L) 11/12/2018 0849   HCT 37.9 02/22/2019 1302   HCT 31.8 (L) 11/12/2018 0849   PLT 300 02/22/2019 1302   PLT 205 11/12/2018 0849   MCV 83.5 02/22/2019 1302   MCV 89 11/12/2018 0849   MCH 25.6 (L) 02/22/2019 1302   MCHC 30.6 02/22/2019 1302   RDW 14.7 02/22/2019 1302   RDW 12.9 11/12/2018 0849   LYMPHSABS 1.8 02/22/2019 1302   LYMPHSABS 1.5 07/23/2018 0919   MONOABS 0.4 02/22/2019 1302   EOSABS 0.1 02/22/2019 1302   EOSABS 0.0 07/23/2018 0919   BASOSABS 0.0 02/22/2019 1302   BASOSABS 0.0 07/23/2018 0919   CMP     Component Value Date/Time   NA 140 02/22/2019 1302   K 3.5 02/22/2019 1302   CL 105 02/22/2019 1302   CO2 26 02/22/2019 1302   GLUCOSE 108 (H) 02/22/2019 1302   BUN 8 02/22/2019 1302   CREATININE 0.64 02/22/2019 1302   CALCIUM 9.6 02/22/2019 1302   PROT 6.9 02/22/2019 1302   ALBUMIN 3.6 02/22/2019 1302   AST 16 02/22/2019 1302   ALT 17 02/22/2019 1302   ALKPHOS 96 02/22/2019 1302   BILITOT 0.4 02/22/2019 1302   GFRNONAA >60 02/22/2019 1302   GFRAA >60 02/22/2019 1302   Urinalysis    Component Value Date/Time   COLORURINE YELLOW 02/22/2019 1209   APPEARANCEUR CLEAR 02/22/2019 1209   LABSPEC 1.015 02/22/2019 1209   PHURINE 5.0 02/22/2019 1209   GLUCOSEU NEGATIVE 02/22/2019 1209   HGBUR MODERATE (A) 02/22/2019 1209   Comfort 02/22/2019 [place] 02/22/2019 1209   PROTEINUR NEGATIVE 02/22/2019 1209    UROBILINOGEN 0.2 03/12/2014 2008   NITRITE NEGATIVE 02/22/2019 1209   LEUKOCYTESUR NEGATIVE 02/22/2019 1209   Results for orders placed or performed during the hospital encounter of 02/22/19 (from the past 48 hour(s))  Urinalysis, Routine w reflex microscopic     Status: Abnormal   Collection Time: 02/22/19 12:09 PM  Result Value Ref Range   Color, Urine YELLOW YELLOW   APPearance CLEAR CLEAR   Specific Gravity, Urine 1.015 1.005 - 1.030   pH 5.0 5.0 - 8.0   Glucose, UA NEGATIVE NEGATIVE mg/dL   Hgb urine dipstick MODERATE (A) NEGATIVE   Bilirubin Urine NEGATIVE NEGATIVE   Ketones, ur NEGATIVE NEGATIVE mg/dL   Protein, ur NEGATIVE NEGATIVE mg/dL   Nitrite NEGATIVE NEGATIVE   Leukocytes,Ua NEGATIVE NEGATIVE   RBC / HPF 6-10 0 - 5 RBC/hpf   WBC, UA 0-5 0 - 5 WBC/hpf   Bacteria, UA RARE (A) NONE SEEN   Squamous Epithelial / LPF 0-5 0 - 5   Mucus PRESENT    Hyaline Casts, UA PRESENT     Comment: Performed at [facility], [address] [place], [place] [address]  Protein / creatinine ratio, urine     Status: None   Collection Time: 02/22/19 12:53 PM  Result Value Ref Range   Creatinine, Urine 115.14 mg/dL   Total Protein, Urine <6 mg/dL    Comment: NO NORMAL RANGE ESTABLISHED FOR THIS TEST   Protein Creatinine Ratio        0.00 - 0.15 mg/mg[Cre]    Comment: RESULT BELOW REPORTABLE RANGE, UNABLE TO CALCULATE. Performed at [facility], [address] [place], [place] [address]   CBC with Differential/Platelet     Status: Abnormal   Collection Time: 02/22/19  1:02 PM  Result Value Ref Range   WBC 4.9 4.0 - 10.5 K/uL   RBC 4.54 3.87 - 5.11 MIL/uL   Hemoglobin 11.6 (L) 12.0 - 15.0 g/dL   HCT 41.337.9 24.436.0 - 01.046.0 %   MCV 83.5 80.0 - 100.0 fL   MCH 25.6 (L) 26.0 - 34.0 pg   MCHC 30.6 30.0 - 36.0 g/dL   RDW 27.214.7 53.611.5 - 64.415.5 %   Platelets 300 150 - 400 K/uL   nRBC 0.0 0.0 - 0.2 %   Neutrophils Relative % 53 %   Neutro Abs 2.6 1.7 - 7.7 K/uL   Lymphocytes Relative 37  %   Lymphs Abs 1.8 0.7 - 4.0 K/uL   Monocytes Relative 8 %   Monocytes Absolute 0.4 0.1 - 1.0 K/uL   Eosinophils Relative 2 %   Eosinophils Absolute 0.1 0.0 - 0.5 K/uL   Basophils Relative 0 %   Basophils Absolute 0.0 0.0 - 0.1 K/uL   Immature Granulocytes 0 %   Abs Immature Granulocytes 0.01 0.00 - 0.07 K/uL    Comment: Performed at [facility], [address] [place], [place] [address]  Comprehensive metabolic panel     Status: Abnormal   Collection Time: 02/22/19  1:02 PM  Result Value Ref Range   Sodium 140 135 - 145 mmol/L   Potassium 3.5 3.5 - 5.1 mmol/L   Chloride 105 98 - 111 mmol/L   CO2 26 22 - 32 mmol/L   Glucose, Bld 108 (H) 70 - 99 mg/dL   BUN 8 6 - 20 mg/dL   Creatinine, Ser 2.590.64 0.44 - 1.00 mg/dL   Calcium 9.6 8.9 - 56.310.3 mg/dL   Total Protein 6.9 6.5 - 8.1 g/dL   Albumin 3.6 3.5 - 5.0 g/dL   AST 16 15 - 41 U/L   ALT 17 0 - 44 U/L   Alkaline Phosphatase 96 38 - 126 U/L   Total Bilirubin 0.4 0.3 - 1.2 mg/dL   GFR calc non Af Amer >60 >60 mL/min   GFR  calc Af Amer >60 >60 mL/min   Anion gap 9 5 - 15    Comment: Performed at [facility], [address] [place], [place] [address]    Assessment and Plan  Due to patient history of post partum pre eclamspia which was treated effectively with Magnesium and out patient amlodipine, pre-eclamspia work up initiated. Based on history and physical exam, it was likely dehydration and secondary pain due to dental procedure and patient's history of migraine HA.  Pre eclamspia has been ruled out due to multiple normal range BP while patient was roomed and normal lab results. HA improved.   Headaches: Based on history of headaches refractory to current acet-butab-caff and ibuprofen use prn, medication changed to cyclobenzaprine. Pt has not had HTN since roomed. Pt treatment with cyclobenzaprine and toradol has improved pt headache and decreased from 7/10 to 3-4/10 pain.    Rolfe Hartsell 02/22/2019, 3:59 PM     I confirm that I have verified the information documented in the medical student's note and that I have also personally reperformed the history, physical exam and all medical decision making activities of this service and have verified that all service and findings are accurately documented in this student's note.   Discussed patient with Dr. Anyanwu. RX for flexeril. Strict return precautions.   Khaliyah Northrop I, NP 02/22/2019 4:01 PM    

## 2019-03-01 ENCOUNTER — Encounter: Payer: Self-pay | Admitting: Family Medicine

## 2019-03-01 ENCOUNTER — Other Ambulatory Visit: Payer: Self-pay | Admitting: Family Medicine

## 2019-03-01 ENCOUNTER — Other Ambulatory Visit: Payer: Self-pay

## 2019-03-01 ENCOUNTER — Ambulatory Visit (INDEPENDENT_AMBULATORY_CARE_PROVIDER_SITE_OTHER): Payer: Medicaid Other | Admitting: Family Medicine

## 2019-03-01 DIAGNOSIS — Z975 Presence of (intrauterine) contraceptive device: Secondary | ICD-10-CM

## 2019-03-01 DIAGNOSIS — Z1389 Encounter for screening for other disorder: Secondary | ICD-10-CM

## 2019-03-01 DIAGNOSIS — Z3480 Encounter for supervision of other normal pregnancy, unspecified trimester: Secondary | ICD-10-CM

## 2019-03-01 DIAGNOSIS — O1495 Unspecified pre-eclampsia, complicating the puerperium: Secondary | ICD-10-CM

## 2019-03-01 DIAGNOSIS — M62838 Other muscle spasm: Secondary | ICD-10-CM

## 2019-03-01 DIAGNOSIS — N921 Excessive and frequent menstruation with irregular cycle: Secondary | ICD-10-CM

## 2019-03-01 MED ORDER — NORETHINDRONE 0.35 MG PO TABS: 1.0000 | ORAL_TABLET | Freq: Every day | ORAL | 1 refills | Status: DC

## 2019-03-01 NOTE — Progress Notes (Signed)
Post Partum Exam  Kelly Rush is a 33 y.o. G64P3003 female who presents for a postpartum visit. She is 4 weeks postpartum following a spontaneous vaginal delivery. I have fully reviewed the prenatal and intrapartum course. The delivery was at 22 gestational weeks.  Anesthesia: epidural. Postpartum course has been Unremarkable. Baby's course has been unremarkable. Baby is feeding by breast. Bleeding red, clots and changing a pad every 1-hrs. . Bowel function is abnormal: Constipated . Bladder function is normal. Patient is not sexually active. Contraception method is IUD. Pt considering having IUD removed due to bleeding Postpartum depression screening:neg  Reports intermittent bleeding, not soaking through pads since delivery Thinking about having IUD removed because of this  Also having headaches primarily in the back of her head Also notes neck and shoulder pain Denies vision changes, chest pain, SOB, upper abdominal pain, LE edema Has been taking amlodipine Seen in MAU recently for same, given flexeril and notes significant improvement when she takes this though also makes her drowsy  Ongoing hip pain which she went to PT for during pregnancy Would like to go back  Last pap smear done 07/23/2018 and was Normal  Review of Systems Pertinent items are noted in HPI.    Objective:  currently breastfeeding.  LMP  (LMP Unknown)  BP 126/88 P 114  General:  alert, cooperative and no distress   Breasts:  deferred  Lungs: comfortable on room air  Heart:  RRR  Abdomen: soft, non-tender; bowel sounds normal; no masses,  no organomegaly   Vulva:  normal  Vagina: normal vagina and scant blood without clots  Cervix:  normal appearance, IUD strings ~10cm in length  Corpus: not examined  Adnexa:  not evaluated  Rectal Exam: MSK: Not performed.  Significant spasm of bilateral cervical paraspinals and trapezius muscles        Assessment:    Normal postpartum exam. Pap smear not done at  today's visit.   Plan:   1. Contraception: IUD placed post placentally at delivery. Patient initially requesting IUD removal due to bleeding. Discussed at length, ultimately willing to trial POP for one month to see if this reduces bleeding.  2. PP Pre-eclampsia: recent admission for this, started on Amlodipine 10mg  daily which she is taking, BP well controlled today. Reporting headache but BP normal, no other symptoms, and description of headache/location/improvement with flexeril all more indicative of occipital tension headache. Reviewed warning signs that would prompt presentation to MAU, instructed to take flexeril nightly, also referred to PT given ongoing symptoms.   3. Mood: up and down but reports good supports in husband, no thoughts of hurting/killing self, able to care for self and baby.   4. Hip pain: reports persistent though pregnancy and good results with PT in past, referred again  5. Follow up in: 2 weeks for BP check

## 2019-03-15 ENCOUNTER — Ambulatory Visit: Payer: Medicaid Other

## 2019-04-01 ENCOUNTER — Ambulatory Visit: Payer: Medicaid Other | Admitting: Physical Therapy

## 2019-04-09 ENCOUNTER — Telehealth (INDEPENDENT_AMBULATORY_CARE_PROVIDER_SITE_OTHER): Payer: Medicaid Other | Admitting: Family Medicine

## 2019-04-09 ENCOUNTER — Other Ambulatory Visit: Payer: Self-pay

## 2019-04-09 ENCOUNTER — Encounter: Payer: Self-pay | Admitting: Family Medicine

## 2019-04-09 ENCOUNTER — Other Ambulatory Visit: Payer: Self-pay | Admitting: Family Medicine

## 2019-04-09 VITALS — BP 118/74 | HR 90

## 2019-04-09 DIAGNOSIS — I10 Essential (primary) hypertension: Secondary | ICD-10-CM | POA: Diagnosis not present

## 2019-04-09 DIAGNOSIS — F418 Other specified anxiety disorders: Secondary | ICD-10-CM | POA: Diagnosis not present

## 2019-04-09 DIAGNOSIS — O99345 Other mental disorders complicating the puerperium: Secondary | ICD-10-CM

## 2019-04-09 MED ORDER — ENALAPRIL MALEATE 10 MG PO TABS: 10.0000 mg | ORAL_TABLET | Freq: Every day | ORAL | 3 refills | Status: DC

## 2019-04-09 NOTE — Progress Notes (Signed)
    TELEHEALTH GYNECOLOGY VIRTUAL VIDEO VISIT ENCOUNTER NOTE  Provider location: [facility] for [facility] at [place]   I connected with Kelly Rush on 04/09/19 at  1:45 PM EST by WebEx Video Encounter at home and verified that I am speaking with the correct person using two identifiers.   I discussed the limitations, risks, security and privacy concerns of performing an evaluation and management service virtually and the availability of in person appointments. I also discussed with the patient that there may be a patient responsible charge related to this service. The patient expressed understanding and agreed to proceed.   History:  Kelly Rush is a 33 y.o. G46P3003 female being evaluated today for hypertension. She is approximately 2 weeks postpartum. Having episodes of elevated blood pressure: having chest pressure, headache, rapid heart rate along with it. Happens at rest and when she's doing things. Has some anxiety, mostly about returning to work.  She has been having She denies any abnormal vaginal discharge, bleeding, pelvic pain or other concerns.       Past Medical History:  Diagnosis Date  . Anemia   . HSV (herpes simplex virus) infection    No past surgical history on file. The following portions of the patient's history were reviewed and updated as appropriate: allergies, current medications, past family history, past medical history, past social history, past surgical history and problem list.    Review of Systems:  Pertinent items noted in HPI and remainder of comprehensive ROS otherwise negative.  Physical Exam:   General:  Alert, oriented and cooperative. Patient appears to be in no acute distress.  Mental Status: Normal mood and affect. Normal behavior. Normal judgment and thought content.   Respiratory: Normal respiratory effort, no problems with respiration noted  Rest of physical exam deferred due to type of encounter  Labs and Imaging No results found  for this or any previous visit (from the past 336 hour(s)). No results found.     Assessment and Plan:     1. Essential hypertension Change to enalapril to see if this better controls her symptoms. It doesn't sound like she's having episodes of SVT - HR during episodes only 110.   2. Postpartum anxiety Uncertain whether anxiety is causing elevated BP or other way around. Would like to treat BP first, then look at treatment of anxiety if still present.       I discussed the assessment and treatment plan with the patient. The patient was provided an opportunity to ask questions and all were answered. The patient agreed with the plan and demonstrated an understanding of the instructions.   The patient was advised to call back or seek an in-person evaluation/go to the ED if the symptoms worsen or if the condition fails to improve as anticipated.  I provided 17 minutes of face-to-face time during this encounter.   [place] [facility] [employer], [place]

## 2019-04-09 NOTE — Progress Notes (Signed)
I connected with  Kelly Rush on 04/09/19 by a video enabled telemedicine application and verified that I am speaking with the correct person using two identifiers.   I discussed the limitations of evaluation and management by telemedicine. The patient expressed understanding and agreed to proceed.  WebEx.  C/o spotting with IUD, needs referral for PCP to manage BP as it sometimes gets elevated and also anxiety issues.  GAD-7=11

## 2019-04-10 ENCOUNTER — Ambulatory Visit: Payer: Medicaid Other | Attending: Obstetrics and Gynecology | Admitting: Physical Therapy

## 2019-04-10 ENCOUNTER — Encounter: Payer: Self-pay | Admitting: Physical Therapy

## 2019-04-10 DIAGNOSIS — M25552 Pain in left hip: Secondary | ICD-10-CM | POA: Diagnosis present

## 2019-04-10 DIAGNOSIS — M62838 Other muscle spasm: Secondary | ICD-10-CM

## 2019-04-10 DIAGNOSIS — M25511 Pain in right shoulder: Secondary | ICD-10-CM | POA: Diagnosis present

## 2019-04-10 DIAGNOSIS — M542 Cervicalgia: Secondary | ICD-10-CM

## 2019-04-10 DIAGNOSIS — R519 Headache, unspecified: Secondary | ICD-10-CM

## 2019-04-10 DIAGNOSIS — M25512 Pain in left shoulder: Secondary | ICD-10-CM

## 2019-04-10 DIAGNOSIS — M6281 Muscle weakness (generalized): Secondary | ICD-10-CM | POA: Diagnosis present

## 2019-04-10 DIAGNOSIS — M25551 Pain in right hip: Secondary | ICD-10-CM | POA: Diagnosis present

## 2019-04-10 NOTE — Patient Instructions (Signed)
Access Code: [email or web address]  URL: [email or web address]  Date: 04/10/2019  Prepared by: Earlie Counts   Exercises Supine Mid-Thoracic Mobilization - 10 reps - 1 sets - 1x daily - 7x weekly Supine Upper-Thoracic Mobilization - 10 reps - 1 sets - 1x daily - 7x weekly Supine Suboccipital Release with Tennis Balls - 10 reps - 1 sets - 1x daily - 7x weekly Seated Upper Trap Stretch - 2 reps - 1 sets - 15 sec hold - 1x daily                            - 7x weekly Seated Cervical Flexion Stretch with Finger Support Behind Neck - 2 reps - 1 sets - 5 sec hold - 1x daily - 7x weekly Patient Education [facility] [address] [place], [place] [address] Phone # [phone] Fax [phone]

## 2019-04-10 NOTE — Therapy (Signed)
[facility] [address], [address] [place], [place], [address] Phone: [phone]   Fax:  [phone]  Physical Therapy Evaluation  Patient Details  Name: Kelly ColumbiaMichelle Oppenheimer MRN: [id number] Date of Birth: 01-Nov-1985 Referring Provider (PT): Dr. Merian CapronMatthew Eckstat   Encounter Date: 04/10/2019  PT End of Session - 04/10/19 1208    Visit Number  1    Date for PT Re-Evaluation  06/05/19    Authorization Type  Medicaid    PT Start Time  0945    PT Stop Time  1015    PT Time Calculation (min)  30 min    Activity Tolerance  Patient tolerated treatment well    Behavior During Therapy  [facility] for tasks assessed/performed       Past Medical History:  Diagnosis Date  . Anemia   . HSV (herpes simplex virus) infection     History reviewed. No pertinent surgical history.  There were no vitals filed for this visit.   Subjective Assessment - 04/10/19 0944    Subjective  Patient reports since she had the baby. Patient reports her hips were initially popping and aching. Patient has to sleep with pillow between her legs. Patient has diastasis recti. Patient has pain with neck and shoulder pain and reason for headaches.    Patient Stated Goals  get diastasis worked on , reduce headaches by working on neck and shoulders    Currently in Pain?  Yes    Pain Score  9     Pain Location  Neck   shoulder   Pain Orientation  Right;Left    Pain Descriptors / Indicators  Tightness;Heaviness   popping in right.   Pain Type  Acute pain    Pain Onset  More than a month ago    Pain Frequency  Constant    Aggravating Factors   sitting up for long periods of time    Pain Relieving Factors  lay down and nurse, roll shoulders, stretch the  muscles, massage    Multiple Pain Sites  Yes    Pain Score  6    Pain Location  Hip    Pain Orientation  Right;Left    Pain Descriptors / Indicators  Heaviness    Pain Type  Acute pain    Pain Onset  More than a month  ago    Pain Frequency  Intermittent    Aggravating Factors   laying on side    Pain Relieving Factors  sleeping with pillow between legs         [facility] - 04/10/19 0001      Assessment   Medical Diagnosis  M62.838 Muscle spasm    Referring Provider (PT)  Dr. Merian CapronMatthew Eckstat    Onset Date/Surgical Date  02/01/19    Prior Therapy  yes when pregnant      Precautions   Precautions  None      Restrictions   Weight Bearing Restrictions  No      Balance Screen   Has the patient fallen in the past 6 months  No    Has the patient had a decrease in activity level because of a fear of falling?   No    Is the patient reluctant to leave their home because of a fear of falling?   No      Home Public house managernvironment   Living Environment  Private residence      Prior Function   Level of Independence  Independent    Vocation  Full time employment    Equities trader      Cognition   Overall Cognitive Status  Within Functional Limits for tasks assessed      Posture/Postural Control   Posture/Postural Control  Postural limitations    Postural Limitations  Rounded Shoulders;Forward head      ROM / Strength   AROM / PROM / Strength  AROM;PROM;Strength      AROM   Cervical Flexion  40    Cervical Extension  55    Cervical - Right Side Bend  30    Cervical - Left Side Bend  40    Cervical - Right Rotation  70    Cervical - Left Rotation  60    Lumbar Flexion  full    Lumbar - Right Side Bend  decreased by 25%    Lumbar - Right Rotation  decreased by 25%      Strength   Right Shoulder Flexion  4/5    Right Shoulder External Rotation  4/5    Left Shoulder Flexion  4/5    Left Shoulder External Rotation  4/5    Right Hip Extension  4/5    Right Hip ABduction  4/5    Right Hip ADduction  4/5    Left Hip Extension  4-/5    Left Hip ABduction  3+/5    Left Hip ADduction  4/5      Palpation   SI assessment   ASIS are equal    Palpation comment  suoboccipitals,  cervical paraspinals, Thoracic, lumbar paraspinals are tendern and tight                Objective measurements completed on examination: See above findings.    Pelvic Floor Special Questions - 04/10/19 0001    Prior Pregnancies  Yes    Diastasis Recti  2 fingers above the umbilicus and 1 finger width just below the xiphoid process and 2 fingers width below the umbilicus; feel tension of the tissue in the diastasis recti    Currently Sexually Active  Yes    Is this Painful  No    Urinary Leakage  No    Fecal incontinence  No               PT Education - 04/10/19 1208    Education Details  Access Code: [email or web address]    Person(s) Educated  Patient    Methods  Explanation;Demonstration;Verbal cues;Handout    Comprehension  Verbalized understanding;Returned demonstration       PT Short Term Goals - 04/10/19 1224      PT SHORT TERM GOAL #1   Title  independent with initial HEP    Baseline  not educated yet    Time  4    Period  Weeks    Status  New    Target Date  05/08/19        PT Long Term Goals - 04/10/19 1224      PT LONG TERM GOAL #1   Title  independent with HEP and understand how to progress herself    Baseline  still learning    Time  8    Period  Weeks    Status  New    Target Date  06/05/19      PT LONG TERM GOAL #2   Title  understand ways to manage her pain with correct body mechanics with daily activities and caring for her  children    Baseline  not educated yet    Time  8    Period  Weeks    Status  New    Target Date  06/05/19      PT LONG TERM GOAL #3   Title  cervical pain level decreased >/= 2/10 so her headaches happen </= 1x per every 2 weeks    Baseline  pain level is 9/10 and headaches are daily    Time  8    Period  Weeks    Status  New    Target Date  06/05/19      PT LONG TERM GOAL #4   Title  diastasis recti is </= 1 finger width apart due to decreased fascial restrictions and the fascial support has improved     Baseline  diastasis recti is 2 fingers width and patient bulges her abdomen out of it    Time  8    Period  Weeks    Status  New    Target Date  06/05/19      PT LONG TERM GOAL #5   Title  pain with bilateral hips is </= 2/10 due to improve strength >/= 4+/5 and ability to perform her chores with greater ease    Baseline  pain level is 6/10 and strength is 3+/5-4/5    Time  8    Period  Weeks    Status  New    Target Date  06/05/19             Plan - 04/10/19 1211    Clinical Impression Statement  Patient is a 33 year old female who had a baby vagially on 02/01/2019. Patient reports pain level is 9/10 for cervical and shoulders that contribute to her headaches. Patient pain level for hips is 6/10 bilaterally with laying on her side. Diastasis recti below and above umbilicus is 2 fingers width with minimal fascial tightness midline and 1 finger width juse below the sternum. Lumbar right rotation and sidebending decreased by 25%. Bilateral shoulder strength is 4/5 for flexion and external rotation. Bilateral hip flexion, abduction, and adduction ranges from 3+/5 to 4/5. Patient has tightness and tenderness located in cervical suboccipitals, cervical, thoracic and lumbar paraspinals. Patient pain makes it difficult for caring for her children, doing her daily chores and getting the appropriate rest. Patient will benefit from skilled therapy to reduce pain level to improve her quality of life and improve strength.    Personal Factors and Comorbidities  Comorbidity 1    Comorbidities  diastasis recti, had a baby on 02/01/19    Examination-Activity Limitations  Bed Mobility;Stand;Squat;Sit    Examination-Participation Restrictions  Community Activity;Laundry;Cleaning    Stability/Clinical Decision Making  Evolving/Moderate complexity    Rehab Potential  Excellent    PT Frequency  2x / week    PT Duration  8 weeks    PT Treatment/Interventions  ADLs/Self Care Home Management;Iontophoresis  4mg /ml Dexamethasone;Ultrasound;Therapeutic exercise;Therapeutic activities;Neuromuscular re-education;Patient/family education;Dry needling;Passive range of motion;Manual techniques;Spinal Manipulations;Joint Manipulations    PT Next Visit Plan  dry needling to lumbar, cervical and thoracic paraspinals, suboccipitals, spinal mobilization, soft tissue work to pull the tissue from her back to the front to reduce the diastasis recti, back stretches, hip abduction, extension strength, core strength    PT Home Exercise Plan  Access Code: [email or web address]    Consulted and Agree with Plan of Care  Patient       Patient will benefit from skilled therapeutic intervention in  order to improve the following deficits and impairments:  Decreased range of motion, Increased fascial restricitons, Increased muscle spasms, Pain, Decreased mobility, Decreased strength  Visit Diagnosis: Muscle weakness (generalized) - Plan: PT plan of care cert/re-cert  Pain in left hip - Plan: PT plan of care cert/re-cert  Pain in right hip - Plan: PT plan of care cert/re-cert  Cervicalgia - Plan: PT plan of care cert/re-cert  Acute pain of left shoulder - Plan: PT plan of care cert/re-cert  Acute pain of right shoulder - Plan: PT plan of care cert/re-cert  Frequent headaches - Plan: PT plan of care cert/re-cert  Other muscle spasm - Plan: PT plan of care cert/re-cert     Problem List Patient Active Problem List   Diagnosis Date Noted  . IUD (intrauterine device) in place 03/01/2019  . Preeclampsia in postpartum period 02/15/2019  . Labor and delivery, indication for care 02/01/2019  . [redacted] weeks gestation of pregnancy 02/01/2019  . HSV (herpes simplex virus) infection   . Back pain affecting pregnancy   . Supervision of other normal pregnancy, antepartum 06/04/2014    Eulis Foster, PT 04/10/19 12:33 PM   [facility] [address] [place], [place],  [address] Phone: [phone]   Fax:  [phone]  Name: Norine Reddington MRN: [id number] Date of Birth: October 31, 1985

## 2019-04-18 ENCOUNTER — Ambulatory Visit: Payer: Medicaid Other | Admitting: Physical Therapy

## 2019-04-22 ENCOUNTER — Ambulatory Visit: Payer: Medicaid Other | Admitting: Physical Therapy

## 2019-04-24 ENCOUNTER — Ambulatory Visit (INDEPENDENT_AMBULATORY_CARE_PROVIDER_SITE_OTHER): Payer: Medicaid Other | Admitting: Women's Health

## 2019-04-24 ENCOUNTER — Ambulatory Visit: Payer: Medicaid Other | Admitting: Obstetrics and Gynecology

## 2019-04-24 ENCOUNTER — Other Ambulatory Visit: Payer: Self-pay

## 2019-04-24 ENCOUNTER — Ambulatory Visit: Payer: Medicaid Other | Admitting: Physical Therapy

## 2019-04-24 VITALS — BP 142/81 | HR 96 | Wt 154.0 lb

## 2019-04-24 DIAGNOSIS — M25552 Pain in left hip: Secondary | ICD-10-CM

## 2019-04-24 DIAGNOSIS — I10 Essential (primary) hypertension: Secondary | ICD-10-CM

## 2019-04-24 DIAGNOSIS — F419 Anxiety disorder, unspecified: Secondary | ICD-10-CM

## 2019-04-24 DIAGNOSIS — M25551 Pain in right hip: Secondary | ICD-10-CM

## 2019-04-24 DIAGNOSIS — M6281 Muscle weakness (generalized): Secondary | ICD-10-CM | POA: Diagnosis not present

## 2019-04-24 DIAGNOSIS — M542 Cervicalgia: Secondary | ICD-10-CM

## 2019-04-24 NOTE — Progress Notes (Signed)
Pt presents today for BP check and discuss anxiety. EPDS=5 today. Pt states she feels her anxiety is getting worse.

## 2019-04-24 NOTE — Patient Instructions (Addendum)
Hypertension, Adult High blood pressure (hypertension) is when the force of blood pumping through the arteries is too strong. The arteries are the blood vessels that carry blood from the heart throughout the body. Hypertension forces the heart to work harder to pump blood and may cause arteries to become narrow or stiff. Untreated or uncontrolled hypertension can cause a heart attack, heart failure, a stroke, kidney  Nonspecific Chest Pain, Adult Chest pain can be caused by many different conditions. It can be caused by a condition that is life-threatening and requires treatment right away. It can also be caused by something that is not life-threatening. If you have chest pain, it can be hard to know the difference, so it is important to get help right away to make sure that you do not have a serious condition. Some life-threatening causes of chest pain include:  Heart attack.  A tear in the body's main blood vessel (aortic dissection).  Inflammation around your heart (pericarditis).  A problem in the lungs, such as a blood clot (pulmonary embolism) or a collapsed lung (pneumothorax). Some non life-threatening causes of chest pain include:  Heartburn.  Anxiety or stress.  Damage to the bones, muscles, and cartilage that make up your chest wall.  Pneumonia or bronchitis.  Shingles infection (varicella-zoster virus). Chest pain can feel like:  Pain or discomfort on the surface of your chest or deep in your chest.  Crushing, pressure, aching, or squeezing pain.  Burning or tingling.  Dull or sharp pain that is worse when you move, cough, or take a deep breath.  Pain or discomfort that is also felt in your back, neck, jaw, shoulder, or arm, or pain that spreads to any of these areas. Your chest pain may come and go. It may also be constant. Your health care provider will do lab tests and other studies to find the cause of your pain. Treatment will depend on the cause of your chest  pain. Follow these instructions at home: Medicines  Take over-the-counter and prescription medicines only as told by your health care provider.  If you were prescribed an antibiotic, take it as told by your health care provider. Do not stop taking the antibiotic even if you start to feel better. Lifestyle   Rest as directed by your health care provider.  Do not use any products that contain nicotine or tobacco, such as cigarettes and e-cigarettes. If you need help quitting, ask your health care provider.  Do not drink alcohol.  Make healthy lifestyle choices as recommended. These may include: ? Getting regular exercise. Ask your health care provider to suggest some activities that are safe for you. ? Eating a heart-healthy diet. This includes plenty of fresh fruits and vegetables, whole grains, low-fat (lean) protein, and low-fat dairy products. A dietitian can help you find healthy eating options. ? Maintaining a healthy weight. ? Managing any other health conditions you have, such as high blood pressure (hypertension) or diabetes. ? Reducing stress, such as with yoga or relaxation techniques. General instructions  Pay attention to any changes in your symptoms. Tell your health care provider about them or any new symptoms.  Avoid any activities that cause chest pain.  Keep all follow-up visits as told by your health care provider. This is important. This includes visits for any further testing if your chest pain does not go away. Contact a health care provider if:  Your chest pain does not go away.  You feel depressed.  You have a fever.  Get help right away if:  Your chest pain gets worse.  You have a cough that gets worse, or you cough up blood.  You have severe pain in your abdomen.  You faint.  You have sudden, unexplained chest discomfort.  You have sudden, unexplained discomfort in your arms, back, neck, or jaw.  You have shortness of breath at any time.  You  suddenly start to sweat, or your skin gets clammy.  You feel nausea or you vomit.  You suddenly feel lightheaded or dizzy.  You have severe weakness, or unexplained weakness or fatigue.  Your heart begins to beat quickly, or it feels like it is skipping beats. These symptoms may represent a serious problem that is an emergency. Do not wait to see if the symptoms will go away. Get medical help right away. Call your local emergency services (911 in the [place] Do not drive yourself to the hospital. Summary  Chest pain can be caused by a condition that is serious and requires urgent treatment. It may also be caused by something that is not life-threatening.  If you have chest pain, it is very important to see your health care provider. Your health care provider may do lab tests and other studies to find the cause of your pain.  Follow your health care provider's instructions on taking medicines, making lifestyle changes, and getting emergency treatment if symptoms become worse.  Keep all follow-up visits as told by your health care provider. This includes visits for any further testing if your chest pain does not go away. This information is not intended to replace advice given to you by your health care provider. Make sure you discuss any questions you have with your health care provider. Document Released: 01/26/2005 Document Revised: 10/19/2017 Document Reviewed: 10/19/2017 Elsevier Patient Education  [employer]. disease, and other problems. A blood pressure reading consists of a higher number over a lower number. Ideally, your blood pressure should be below 120/80. The first ("top") number is called the systolic pressure. It is a measure of the pressure in your arteries as your heart beats. The second ("bottom") number is called the diastolic pressure. It is a measure of the pressure in your arteries as the heart relaxes. What are the causes? The exact cause of this condition is not  known. There are some conditions that result in or are related to high blood pressure. What increases the risk? Some risk factors for high blood pressure are under your control. The following factors may make you more likely to develop this condition:  Smoking.  Having type 2 diabetes mellitus, high cholesterol, or both.  Not getting enough exercise or physical activity.  Being overweight.  Having too much fat, sugar, calories, or salt (sodium) in your diet.  Drinking too much alcohol. Some risk factors for high blood pressure may be difficult or impossible to change. Some of these factors include:  Having chronic kidney disease.  Having a family history of high blood pressure.  Age. Risk increases with age.  Race. You may be at higher risk if you are African American.  Gender. Men are at higher risk than women before age 47. After age 55, women are at higher risk than men.  Having obstructive sleep apnea.  Stress. What are the signs or symptoms? High blood pressure may not cause symptoms. Very high blood pressure (hypertensive crisis) may cause:  Headache.  Anxiety.  Shortness of breath.  Nosebleed.  Nausea and vomiting.  Vision changes.  Severe chest pain.  Seizures. How is this diagnosed? This condition is diagnosed by measuring your blood pressure while you are seated, with your arm resting on a flat surface, your legs uncrossed, and your feet flat on the floor. The cuff of the blood pressure monitor will be placed directly against the skin of your upper arm at the level of your heart. It should be measured at least twice using the same arm. Certain conditions can cause a difference in blood pressure between your right and left arms. Certain factors can cause blood pressure readings to be lower or higher than normal for a short period of time:  When your blood pressure is higher when you are in a health care provider's office than when you are at home, this is  called white coat hypertension. Most people with this condition do not need medicines.  When your blood pressure is higher at home than when you are in a health care provider's office, this is called masked hypertension. Most people with this condition may need medicines to control blood pressure. If you have a high blood pressure reading during one visit or you have normal blood pressure with other risk factors, you may be asked to:  Return on a different day to have your blood pressure checked again.  Monitor your blood pressure at home for 1 week or longer. If you are diagnosed with hypertension, you may have other blood or imaging tests to help your health care provider understand your overall risk for other conditions. How is this treated? This condition is treated by making healthy lifestyle changes, such as eating healthy foods, exercising more, and reducing your alcohol intake. Your health care provider may prescribe medicine if lifestyle changes are not enough to get your blood pressure under control, and if:  Your systolic blood pressure is above 130.  Your diastolic blood pressure is above 80. Your personal target blood pressure may vary depending on your medical conditions, your age, and other factors. Follow these instructions at home: Eating and drinking   Eat a diet that is high in fiber and potassium, and low in sodium, added sugar, and fat. An example eating plan is called the DASH (Dietary Approaches to Stop Hypertension) diet. To eat this way: ? Eat plenty of fresh fruits and vegetables. Try to fill one half of your plate at each meal with fruits and vegetables. ? Eat whole grains, such as whole-wheat pasta, brown rice, or whole-grain bread. Fill about one fourth of your plate with whole grains. ? Eat or drink low-fat dairy products, such as skim milk or low-fat yogurt. ? Avoid fatty cuts of meat, processed or cured meats, and poultry with skin. Fill about one fourth of  your plate with lean proteins, such as fish, chicken without skin, beans, eggs, or tofu. ? Avoid pre-made and processed foods. These tend to be higher in sodium, added sugar, and fat.  Reduce your daily sodium intake. Most people with hypertension should eat less than 1,500 mg of sodium a day.  Do not drink alcohol if: ? Your health care provider tells you not to drink. ? You are pregnant, may be pregnant, or are planning to become pregnant.  If you drink alcohol: ? Limit how much you use to:  0-1 drink a day for women.  0-2 drinks a day for men. ? Be aware of how much alcohol is in your drink. In the [place] one drink equals one 12 oz bottle of beer (355 mL),  one 5 oz glass of wine (148 mL), or one 1 oz glass of hard liquor (44 mL). Lifestyle   Work with your health care provider to maintain a healthy body weight or to lose weight. Ask what an ideal weight is for you.  Get at least 30 minutes of exercise most days of the week. Activities may include walking, swimming, or biking.  Include exercise to strengthen your muscles (resistance exercise), such as Pilates or lifting weights, as part of your weekly exercise routine. Try to do these types of exercises for 30 minutes at least 3 days a week.  Do not use any products that contain nicotine or tobacco, such as cigarettes, e-cigarettes, and chewing tobacco. If you need help quitting, ask your health care provider.  Monitor your blood pressure at home as told by your health care provider.  Keep all follow-up visits as told by your health care provider. This is important. Medicines  Take over-the-counter and prescription medicines only as told by your health care provider. Follow directions carefully. Blood pressure medicines must be taken as prescribed.  Do not skip doses of blood pressure medicine. Doing this puts you at risk for problems and can make the medicine less effective.  Ask your health care provider about side effects or  reactions to medicines that you should watch for. Contact a health care provider if you:  Think you are having a reaction to a medicine you are taking.  Have headaches that keep coming back (recurring).  Feel dizzy.  Have swelling in your ankles.  Have trouble with your vision. Get help right away if you:  Develop a severe headache or confusion.  Have unusual weakness or numbness.  Feel faint.  Have severe pain in your chest or abdomen.  Vomit repeatedly.  Have trouble breathing. Summary  Hypertension is when the force of blood pumping through your arteries is too strong. If this condition is not controlled, it may put you at risk for serious complications.  Your personal target blood pressure may vary depending on your medical conditions, your age, and other factors. For most people, a normal blood pressure is less than 120/80.  Hypertension is treated with lifestyle changes, medicines, or a combination of both. Lifestyle changes include losing weight, eating a healthy, low-sodium diet, exercising more, and limiting alcohol. This information is not intended to replace advice given to you by your health care provider. Make sure you discuss any questions you have with your health care provider. Document Released: 04/18/2005 Document Revised: 12/27/2017 Document Reviewed: 12/27/2017 Elsevier Patient Education  2020 [employer] Heart Attack The heart is a muscle that needs oxygen to survive. A heart attack is a condition that occurs when your heart does not get enough oxygen. When this happens, the heart muscle begins to die. This can cause permanent damage if not treated right away. A heart attack is a medical emergency. This condition may be called a myocardial infarction, or MI. It is also known as acute coronary syndrome (ACS). ACS is a term used to describe a group of conditions that affect blood flow to the heart. What are the causes? This condition may be caused  by:  Atherosclerosis. This occurs when a fatty substance called plaque builds up in the arteries and blocks or reduces blood supply to the heart.  A blood clot. A blood clot can develop suddenly when plaque breaks up within an artery and blocks blood flow to the heart.  Low blood pressure.  An abnormal  heartbeat (arrhythmia).  Conditions that cause a decrease of oxygen to the heart, such as anemiaorrespiratory failure.  A spasm, or severe tightening, of a blood vessel that cuts off blood flow to the heart.  Tearing of a coronary artery (spontaneous coronary artery dissection).  High blood pressure. What increases the risk? The following factors may make you more likely to develop this condition:  Aging. The older you are, the higher your risk.  Having a personal or family history of chest pain, heart attack, stroke, or narrowing of the arteries in the legs, arms, head, or stomach (peripheral artery disease).  Being female.  Smoking.  Not getting regular exercise.  Being overweight or obese.  Having high blood pressure.  Having high cholesterol (hypercholesterolemia).  Having diabetes.  Drinking too much alcohol.  Using illegal drugs, such as cocaine or methamphetamine. What are the signs or symptoms? Symptoms of this condition may vary, depending on factors like gender and age. Symptoms may include:  Chest pain. It may feel like: ? Crushing or squeezing. ? Tightness, pressure, fullness, or heaviness.  Pain in the arm, neck, jaw, back, or upper body.  Shortness of breath.  Heartburn or upset stomach.  Nausea.  Sudden cold sweats.  Feeling tired.  Sudden light-headedness. How is this diagnosed? This condition may be diagnosed through tests, such as:  Electrocardiogram (ECG) to measure the electrical activity of your heart.  Blood tests to check for cardiac markers. These chemicals are released by a damaged heart muscle.  A test to evaluate blood flow  and heart function (coronary angiogram).  CT scan to see the heart more clearly.  A test to evaluate the pumping action of the heart (echocardiogram). How is this treated? A heart attack must be treated as soon as possible. Treatment may include:  Medicines to: ? Break up or dissolve blood clots (fibrinolytic therapy). ? Thin blood and help prevent blood clots. ? Treat blood pressure. ? Improve blood flow to the heart. ? Reduce pain. ? Reduce cholesterol.  Angioplasty and stent placement. These are procedures to widen a blocked artery and keep it open.  Coronary artery bypass graft, CABG, or open heart surgery. This enables blood to flow to the heart by going around the blocked part of the artery.  Oxygen therapy if needed.  Cardiac rehabilitation. This improves your health and well-being through exercise, education, and counseling. Follow these instructions at home: Medicines  Take over-the-counter and prescription medicines only as told by your health care provider.  Do not take the following medicines unless your health care provider says it is okay to take them: ? NSAIDs, such as ibuprofen. ? Supplements that contain vitamin A, vitamin E, or both. ? Hormone replacement therapy that contains estrogen with or without progestin. Lifestyle   Do not use any products that contain nicotine or tobacco, such as cigarettes, e-cigarettes, and chewing tobacco. If you need help quitting, ask your health care provider.  Avoid secondhand smoke.  Exercise regularly. Ask your health care provider about participating in a cardiac rehabilitation program that helps you start exercising safely after a heart attack.  Eat a heart-healthy diet. Your health care provider will tell you what foods to eat.  Maintain a healthy weight.  Learn ways to manage stress.  Do not use illegal drugs. Alcohol use  Do not drink alcohol if: ? Your health care provider tells you not to drink. ? You are  pregnant, may be pregnant, or are planning to become pregnant.  If you  drink alcohol: ? Limit how much you use to:  0-1 drink a day for women.  0-2 drinks a day for men. ? Be aware of how much alcohol is in your drink. In the [place] one drink equals one 12 oz bottle of beer (355 mL), one 5 oz glass of wine (148 mL), or one 1 oz glass of hard liquor (44 mL). General instructions  Work with your health care provider to manage any other conditions you have, such as high blood pressure or diabetes. These conditions affect your heart.  Get screened for depression, and seek treatment if needed.  Keep your vaccinations up to date. Get the flu vaccine every year.  Keep all follow-up visits as told by your health care provider. This is important. Contact a health care provider if:  You feel overwhelmed or sad.  You have trouble doing your daily activities. Get help right away if:  You have sudden, unexplained discomfort in your chest, arms, back, neck, jaw, or upper body.  You have shortness of breath.  You suddenly start to sweat or your skin gets clammy.  You feel nauseous or you vomit.  You have unexplained tiredness or weakness.  You suddenly feel light-headed or dizzy.  You notice your heart starts to beat fast or feels like it is skipping beats.  You have blood pressure that is higher than 180/120. These symptoms may represent a serious problem that is an emergency. Do not wait to see if the symptoms will go away. Get medical help right away. Call your local emergency services (911 in the [place] Do not drive yourself to the hospital. Summary  A heart attack, also called myocardial infarction, is a condition that occurs when your heart does not get enough oxygen. This is caused by anything that blocks or reduces blood flow to the heart.  Treatment is a combination of medicines and surgeries, if needed, to open the blocked arteries and restore blood flow to the heart.  A  heart attack is an emergency. Get help right away if you have sudden discomfort in your chest, arms, back, neck, jaw, or upper body. Seek help if you feel nauseous, you vomit, or you feel light-headed or dizzy. This information is not intended to replace advice given to you by your health care provider. Make sure you discuss any questions you have with your health care provider. Document Released: 04/18/2005 Document Revised: 07/26/2018 Document Reviewed: 07/30/2018 Elsevier Patient Education  [employer]. Postpartum Hypertension Postpartum hypertension is high blood pressure that remains higher than normal after childbirth. You may not realize that you have postpartum hypertension if your blood pressure is not being checked regularly. In most cases, postpartum hypertension will go away on its own, usually within a week of delivery. However, for some women, medical treatment is required to prevent serious complications, such as seizures or stroke. What are the causes? This condition may be caused by one or more of the following:  Hypertension that existed before pregnancy (chronic hypertension).  Hypertension that comes on as a result of pregnancy (gestational hypertension).  Hypertensive disorders during pregnancy (preeclampsia) or seizures in women who have high blood pressure during pregnancy (eclampsia).  A condition in which the liver, platelets, and red blood cells are damaged during pregnancy (HELLP syndrome).  A condition in which the thyroid produces too much hormones (hyperthyroidism).  Other rare problems of the nerves (neurological disorders) or blood disorders. In some cases, the cause may not be known. What increases the  risk? The following factors may make you more likely to develop this condition:  Chronic hypertension. In some cases, this may not have been diagnosed before pregnancy.  Obesity.  Type 2 diabetes.  Kidney disease.  History of preeclampsia or  eclampsia.  Other medical conditions that change the level of hormones in the body (hormonal imbalance). What are the signs or symptoms? As with all types of hypertension, postpartum hypertension may not have any symptoms. Depending on how high your blood pressure is, you may experience:  Headaches. These may be mild, moderate, or severe. They may also be steady, constant, or sudden in onset (thunderclap headache).  Changes in your ability to see (visual changes).  Dizziness.  Shortness of breath.  Swelling of your hands, feet, lower legs, or face. In some cases, you may have swelling in more than one of these locations.  Heart palpitations or a racing heartbeat.  Difficulty breathing while lying down.  Decrease in the amount of urine that you pass. Other rare signs and symptoms may include:  Sweating more than usual. This lasts longer than a few days after delivery.  Chest pain.  Sudden dizziness when you get up from sitting or lying down.  Seizures.  Nausea or vomiting.  Abdominal pain. How is this diagnosed? This condition may be diagnosed based on the results of a physical exam, blood pressure measurements, and blood and urine tests. You may also have other tests, such as a CT scan or an MRI, to check for other problems of postpartum hypertension. How is this treated? If blood pressure is high enough to require treatment, your options may include:  Medicines to reduce blood pressure (antihypertensives). Tell your health care provider if you are breastfeeding or if you plan to breastfeed. There are many antihypertensive medicines that are safe to take while breastfeeding.  Stopping medicines that may be causing hypertension.  Treating medical conditions that are causing hypertension.  Treating the complications of hypertension, such as seizures, stroke, or kidney problems. Your health care provider will also continue to monitor your blood pressure closely until it is  within a safe range for you. Follow these instructions at home:  Take over-the-counter and prescription medicines only as told by your health care provider.  Return to your normal activities as told by your health care provider. Ask your health care provider what activities are safe for you.  Do not use any products that contain nicotine or tobacco, such as cigarettes and e-cigarettes. If you need help quitting, ask your health care provider.  Keep all follow-up visits as told by your health care provider. This is important. Contact a health care provider if:  Your symptoms get worse.  You have new symptoms, such as: ? A headache that does not get better. ? Dizziness. ? Visual changes. Get help right away if:  You suddenly develop swelling in your hands, ankles, or face.  You have sudden, rapid weight gain.  You develop difficulty breathing, chest pain, racing heartbeat, or heart palpitations.  You develop severe pain in your abdomen.  You have any symptoms of a stroke. "BE FAST" is an easy way to remember the main warning signs of a stroke: ? B - Balance. Signs are dizziness, sudden trouble walking, or loss of balance. ? E - Eyes. Signs are trouble seeing or a sudden change in vision. ? F - Face. Signs are sudden weakness or numbness of the face, or the face or eyelid drooping on one side. ? A -  Arms. Signs are weakness or numbness in an arm. This happens suddenly and usually on one side of the body. ? S - Speech. Signs are sudden trouble speaking, slurred speech, or trouble understanding what people say. ? T - Time. Time to call emergency services. Write down what time symptoms started.  You have other signs of a stroke, such as: ? A sudden, severe headache with no known cause. ? Nausea or vomiting. ? Seizure. These symptoms may represent a serious problem that is an emergency. Do not wait to see if the symptoms will go away. Get medical help right away. Call your local  emergency services (911 in the [place] Do not drive yourself to the hospital. Summary  Postpartum hypertension is high blood pressure that remains higher than normal after childbirth.  In most cases, postpartum hypertension will go away on its own, usually within a week of delivery.  For some women, medical treatment is required to prevent serious complications, such as seizures or stroke. This information is not intended to replace advice given to you by your health care provider. Make sure you discuss any questions you have with your health care provider. Document Released: 12/20/2013 Document Revised: 05/25/2018 Document Reviewed: 02/06/2017 Elsevier Patient Education  2020 [employer]

## 2019-04-24 NOTE — Therapy (Signed)
[facility] [address] [place], [place], [address] Phone: [phone]   Fax:  [phone]  Physical Therapy Treatment  Patient Details  Name: Kelly Rush MRN: [id number] Date of Birth: July 19, 1985 Referring Provider (PT): Dr. Merian Capron   Encounter Date: 04/24/2019  PT End of Session - 04/24/19 1026    Visit Number  2    Date for PT Re-Evaluation  06/05/19    Authorization Type  Medicaid    Authorization Time Period  12/24/18 to 03/17/2019    PT Start Time  1025    PT Stop Time  1115    PT Time Calculation (min)  50 min    Activity Tolerance  Patient tolerated treatment well    Behavior During Therapy  [facility] for tasks assessed/performed       Past Medical History:  Diagnosis Date  . Anemia   . HSV (herpes simplex virus) infection     No past surgical history on file.  There were no vitals filed for this visit.  Subjective Assessment - 04/24/19 1039    Subjective  My neck and shoulders are hurting today.    Currently in Pain?  Yes    Pain Score  7     Pain Location  Neck    Pain Orientation  Right;Left    Pain Descriptors / Indicators  Tightness;Sore    Aggravating Factors   Doing my tasks during the day    Pain Relieving Factors  laying down    Multiple Pain Sites  No                       [facility] Adult PT Treatment/Exercise - 04/24/19 0001      Self-Care   Other Self-Care Comments   Verbal education on ways to reduce cervical strain with infant care and other ADLS. Pt verbalized understanding of concepts.       Lumbar Exercises: Standing   Other Standing Lumbar Exercises  Red band unattached series for HEP   10x each VC/TC to reduce lordosis engage core     Lumbar Exercises: Seated   Other Seated Lumbar Exercises  Shoulder 3 way raises 2# weights added to HEP      Moist Heat Therapy   Moist Heat Location  --   Upper back and shoulders during soft tissue work,      Manual Therapy   Soft tissue mobilization  cervical RT >LT Trigger point release RT SCM and scalenes      Neck Exercises: Stretches   Upper Trapezius Stretch  Right;Left;3 reps;20 seconds    Levator Stretch  Right;Left;2 reps;30 seconds             PT Education - 04/24/19 1418    Education Details  HEP for red band scapular unattached series, deltoid strength with 2# weights, and self care ways to reduce cervical strain during infant care and other ADLs.    Person(s) Educated  Patient    Methods  Explanation;Demonstration;Tactile cues;Verbal cues;Handout    Comprehension  Returned demonstration;Verbalized understanding       PT Short Term Goals - 04/10/19 1224      PT SHORT TERM GOAL #1   Title  independent with initial HEP    Baseline  not educated yet    Time  4    Period  Weeks    Status  New    Target Date  05/08/19  PT Long Term Goals - 04/10/19 1224      PT LONG TERM GOAL #1   Title  independent with HEP and understand how to progress herself    Baseline  still learning    Time  8    Period  Weeks    Status  New    Target Date  06/05/19      PT LONG TERM GOAL #2   Title  understand ways to manage her pain with correct body mechanics with daily activities and caring for her children    Baseline  not educated yet    Time  8    Period  Weeks    Status  New    Target Date  06/05/19      PT LONG TERM GOAL #3   Title  cervical pain level decreased >/= 2/10 so her headaches happen </= 1x per every 2 weeks    Baseline  pain level is 9/10 and headaches are daily    Time  8    Period  Weeks    Status  New    Target Date  06/05/19      PT LONG TERM GOAL #4   Title  diastasis recti is </= 1 finger width apart due to decreased fascial restrictions and the fascial support has improved    Baseline  diastasis recti is 2 fingers width and patient bulges her abdomen out of it    Time  8    Period  Weeks    Status  New    Target Date  06/05/19      PT  LONG TERM GOAL #5   Title  pain with bilateral hips is </= 2/10 due to improve strength >/= 4+/5 and ability to perform her chores with greater ease    Baseline  pain level is 6/10 and strength is 3+/5-4/5    Time  8    Period  Weeks    Status  New    Target Date  06/05/19            Plan - 04/24/19 1026    Clinical Impression Statement  Pt arrives with complaints of neck and shoulder pain. Pt is not compliant with her HEP, "hard to remember." PTA suggested anchoring her stretches to an already established behavior like when she feeds the baby. Pt agreed and stated she would try this to see if it helps. We reviewed and perfromed her scapular unattached series and deltoid strength for HEP renewal. This will focus on her postural strength and endurance. Pt did have some active trigger points that intermittently throughout the day "shoot" up into her head. Suboccipitals felt like they were not pulling as they once were. Pt was also educated in ways to reduce her cervical strain during infant care and ADLS.    Personal Factors and Comorbidities  Comorbidity 1    Comorbidities  diastasis recti, had a baby on 02/01/19    Examination-Activity Limitations  Bed Mobility;Stand;Squat;Sit    Examination-Participation Restrictions  Community Activity;Laundry;Cleaning    Stability/Clinical Decision Making  Evolving/Moderate complexity    Rehab Potential  Excellent    PT Frequency  2x / week    PT Duration  8 weeks    PT Treatment/Interventions  ADLs/Self Care Home Management;Iontophoresis 4mg /ml Dexamethasone;Ultrasound;Therapeutic exercise;Therapeutic activities;Neuromuscular re-education;Patient/family education;Dry needling;Passive range of motion;Manual techniques;Spinal Manipulations;Joint Manipulations    PT Next Visit Plan  See if pt more compliant with her stretching, follow up with not looking down  as often & frequent. Consider DN to cervical an other manual techniques.    PT Home Exercise Plan   Access Code: [email or web address]    Consulted and Agree with Plan of Care  Patient       Patient will benefit from skilled therapeutic intervention in order to improve the following deficits and impairments:  Decreased range of motion, Increased fascial restricitons, Increased muscle spasms, Pain, Decreased mobility, Decreased strength  Visit Diagnosis: Muscle weakness (generalized)  Pain in left hip  Pain in right hip  Cervicalgia     Problem List Patient Active Problem List   Diagnosis Date Noted  . IUD (intrauterine device) in place 03/01/2019  . Preeclampsia in postpartum period 02/15/2019  . Labor and delivery, indication for care 02/01/2019  . [redacted] weeks gestation of pregnancy 02/01/2019  . HSV (herpes simplex virus) infection   . Back pain affecting pregnancy   . Supervision of other normal pregnancy, antepartum 06/04/2014    Nykayla Marcelli, PTA 04/24/2019, 2:19 PM   [facility] [address] [place], [place], [address] Phone: [phone]   Fax:  [phone]  Name: Kelly Rush MRN: [id number] Date of Birth: May 01, 1986

## 2019-04-24 NOTE — Progress Notes (Signed)
History:  [name] is a 33 y.o. [phone] who presents to clinic today for BP issues. Pt reports pp preeclampsia 2 weeks after delivery which required hospitalization. Pt reports being switched to enalapril, 10mg  daily. Pt reports BPs at home are in 118-120/80s-90s most of the time. Pt also reports sometimes BP is also in [id number] systolic. Pt reports she takes her pills at the same time daily. Pt reports appropriate measurement of BP at home. Pt reports elevated BPs are not isolated to a specific time of day. Pt reports when BP is high she gets HA. Pt also reports anxiety and is unsure if her intermittent chest pain is from anxiety or high BP. Pt reports HA today and reports taking ibuprofen around 1230PM today, which did not help.  Pt also here today to discuss anxiety. The previous provider told her they would attempt enalapril to treat both anxiety and BP, but this did not work. Pt reports anxiety started during her pregnancy and has gotten worse since delivery. Pt denies connection with behavioral health, and accepts referral for behavioral health. Pt denies SI/HI. Pt denies taking any medications for your anxiety. Pt denies history of taking anxiety or depression medication. Edinburgh Score = 5.   The following portions of the patient's history were reviewed and updated as appropriate: allergies, current medications, family history, past medical history, social history, past surgical history and problem list.  Review of Systems:  Review of Systems  Constitutional: Negative for chills and fever.  Respiratory: Negative for shortness of breath.   Cardiovascular: Negative for chest pain.  Gastrointestinal: Negative for abdominal pain, nausea and vomiting.  Genitourinary: Negative for dysuria, frequency and urgency.  Neurological: Positive for headaches.     Objective:  Physical Exam BP (!) 142/81   Pulse 96   Wt 154 lb (69.9 kg)   BMI 28.17 kg/m  Physical Exam  Constitutional: She  is oriented to person, place, and time. She appears well-developed and well-nourished. No distress.  HENT:  Head: Normocephalic and atraumatic.  Respiratory: Effort normal.  Neurological: She is alert and oriented to person, place, and time.  Skin: She is not diaphoretic.  Psychiatric: She has a normal mood and affect. Her behavior is normal. Judgment and thought content normal.   Labs and Imaging No results found for this or any previous visit (from the past 24 hour(s)).  No results found.  Assessment & Plan:  1. Anxiety - pt denies SI/HI - Ambulatory referral to [facility] - Ambulatory referral to [facility]  2. Chronic hypertension - pt to increase enalapril to 20mg  once per day - RTC in 1 week for BP check - Ambulatory referral to [facility]  - consulted with Dr. Rosana Hoes for plan of care.  Clarisa Fling, NP 04/26/2019 8:38 AM

## 2019-04-29 ENCOUNTER — Ambulatory Visit: Payer: Medicaid Other | Admitting: Physical Therapy

## 2019-04-29 ENCOUNTER — Other Ambulatory Visit: Payer: Self-pay

## 2019-04-29 DIAGNOSIS — M542 Cervicalgia: Secondary | ICD-10-CM

## 2019-04-29 DIAGNOSIS — M25552 Pain in left hip: Secondary | ICD-10-CM

## 2019-04-29 DIAGNOSIS — M6281 Muscle weakness (generalized): Secondary | ICD-10-CM | POA: Diagnosis not present

## 2019-04-29 DIAGNOSIS — M25551 Pain in right hip: Secondary | ICD-10-CM

## 2019-04-29 DIAGNOSIS — M25512 Pain in left shoulder: Secondary | ICD-10-CM

## 2019-04-29 NOTE — BH Specialist Note (Signed)
Integrated Behavioral Health via Telemedicine Video Visit  04/29/2019 Kelly Rush [id number]  Number of Integrated Behavioral Health visits: 1 Session Start time: 2:25  Session End time: 3:14 Total time: 78  Referring Provider: Donia Ast, NP Type of Visit: Video Patient/Family location: [facility] Provider location: [facility] All persons participating in visit: Patient Kelly Rush and Kelly Rush    Confirmed patient's address: Yes  Confirmed patient's phone number: Yes  Any changes to demographics: No   Confirmed patient's insurance: Yes  Any changes to patient's insurance: No   Discussed confidentiality: Yes   I connected with Kelly Rush  by a video enabled telemedicine application and verified that I am speaking with the correct person using two identifiers.     I discussed the limitations of evaluation and management by telemedicine and the availability of in person appointments.  I discussed that the purpose of this visit is to provide behavioral health care while limiting exposure to the novel coronavirus.   Discussed there is a possibility of technology failure and discussed alternative modes of communication if that failure occurs.  I discussed that engaging in this video visit, they consent to the provision of behavioral healthcare and the services will be billed under their insurance.  Patient and/or legal guardian expressed understanding and consented to video visit: Yes   PRESENTING CONCERNS: Patient and/or family reports the following symptoms/concerns: Pt states her primary symptoms are fatigue, anxiety, excessive worry, difficulty relaxing, and irritability, all attributed to life stress, including new high blood pressure, son's asthma and epilepsy, work stress, and current covid pandemic. Pt is open to learning additional self-coping strategies today.  Duration of problem: Increase in pregnancy and postpartum; Severity of problem: moderately  severe  STRENGTHS (Protective Factors/Coping Skills): Open to implement self- coping strategies, self-awareness  GOALS ADDRESSED: Patient will: 1.  Reduce symptoms of: anxiety  2.  Increase knowledge and/or ability of: healthy habits  3.  Demonstrate ability to: Increase healthy adjustment to current life circumstances and Increase adequate support systems for patient/family  INTERVENTIONS: Interventions utilized:  Mindfulness or Management consultant, Psychoeducation and/or Health Education and Link to [employer] Standardized Assessments completed: GAD-7 and PHQ 9  ASSESSMENT: Patient currently experiencing Adjustment disorder with anxiety  Patient may benefit from psychoeducation and brief therapeutic interventions regarding coping with symptoms of anxiety  .  PLAN: 1. Follow up with behavioral health clinician on : Two weeks 2. Behavioral recommendations:  -CALM relaxation breathing exercise twice daily (morning; at bedtime with sleep sounds) -Continue using audiobooks and other self-coping strategies that have helped manage stress in the past -Consider registering and attending at least one new mom support group online at either [email or web address] or [email or web address].net  3. Referral(s): Integrated [employer] (In Clinic)  I discussed the assessment and treatment plan with the patient and/or parent/guardian. They were provided an opportunity to ask questions and all were answered. They agreed with the plan and demonstrated an understanding of the instructions.   They were advised to call back or seek an in-person evaluation if the symptoms worsen or if the condition fails to improve as anticipated.  Kelly Rush  Depression screen [facility] 2/9 05/09/2019  Decreased Interest 1  Down, Depressed, Hopeless 0  PHQ - 2 Score 1  Altered sleeping 1  Tired, decreased energy 3  Change in appetite 0  Feeling bad or failure about yourself  1  Trouble concentrating 0   Moving slowly or fidgety/restless 0  Suicidal thoughts 0  PHQ-9 Score  6   GAD 7 : Generalized Anxiety Score 05/09/2019 04/09/2019  Nervous, Anxious, on Edge 3 3  Control/stop worrying 3 1  Worry too much - different things 3 3  Trouble relaxing 3 1  Restless 1 1  Easily annoyed or irritable 3 1  Afraid - awful might happen 1 1  Total GAD 7 Score 17 11  Anxiety Difficulty - Somewhat difficult

## 2019-04-29 NOTE — Therapy (Signed)
[facility] [address], [place], [place], [address] Phone: [phone]   Fax:  [phone]  Physical Therapy Treatment  Patient Details  Name: Kelly Rush MRN: [id number] Date of Birth: 1986-03-07 Referring Provider (PT): Dr. Clayton Lefort   Encounter Date: 04/29/2019  PT End of Session - 04/29/19 1501    Visit Number  3    Date for PT Re-Evaluation  06/05/19    Authorization Type  Medicaid    Authorization Time Period  12/24/18 to 03/17/2019    Authorization - Visit Number  11    PT Start Time  1500   15 min late   PT Stop Time  1545    PT Time Calculation (min)  45 min    Activity Tolerance  Patient tolerated treatment well    Behavior During Therapy  [facility] for tasks assessed/performed       Past Medical History:  Diagnosis Date  . Anemia   . HSV (herpes simplex virus) infection     No past surgical history on file.  There were no vitals filed for this visit.      [facility] PT Assessment - 04/29/19 0001      Assessment   Medical Diagnosis  M62.838 Muscle spasm    Referring Provider (PT)  Dr. Clayton Lefort    Onset Date/Surgical Date  02/01/19      AROM   Cervical Flexion  35    Cervical Extension  25   questionable effort   Cervical - Right Side Bend  30    Cervical - Left Side Bend  32    Cervical - Right Rotation  70    Cervical - Left Rotation  60    Lumbar Flexion  full    Lumbar - Right Side Bend  WNL    Lumbar - Right Rotation  WNL      Strength   Right Shoulder Flexion  4/5    Right Shoulder External Rotation  4/5    Left Shoulder Flexion  4/5    Left Shoulder External Rotation  4/5    Right Hip Extension  4/5    Right Hip ABduction  4/5    Right Hip ADduction  4/5    Left Hip Extension  4-/5    Left Hip ABduction  4/5   Hip flexors initiate contraction   Left Hip ADduction  4/5                   [facility] Adult PT Treatment/Exercise - 04/29/19 0001      Manual Therapy   Soft tissue mobilization  cervical RT >LT Trigger point release RT SCM and scalenes               PT Short Term Goals - 04/10/19 1224      PT SHORT TERM GOAL #1   Title  independent with initial HEP    Baseline  not educated yet    Time  4    Period  Weeks    Status  New    Target Date  05/08/19        PT Long Term Goals - 04/29/19 1502      PT LONG TERM GOAL #1   Title  independent with HEP and understand how to progress herself    Baseline  still learning    Time  8    Period  Weeks    Status  On-going  PT LONG TERM GOAL #2   Title  understand ways to manage her pain with correct body mechanics with daily activities and caring for her children    Time  8    Period  Weeks    Status  Achieved   Not looking down a s much     PT LONG TERM GOAL #3   Status  --   7-8/10 on avg/daily     PT LONG TERM GOAL #5   Title  pain with bilateral hips is </= 2/10 due to improve strength >/= 4+/5 and ability to perform her chores with greater ease    Time  8    Period  Weeks    Status  Partially Met   hip pain abolished but still weak.           Plan - 04/29/19 1529    Clinical Impression Statement  Pt arrived 15 min late for appt. Cervical AROM essentially the same. Pt does report the pain is slightly less now than on initial eval. She had found not looking down as much has been very helpful and doing more stretches throughout the day. Pt's LTLE is still weak in extension but improved in abduction. Pt progressing towards all LTGs.    Personal Factors and Comorbidities  Comorbidity 1    Comorbidities  diastasis recti, had a baby on 02/01/19    Examination-Activity Limitations  Bed Mobility;Stand;Squat;Sit    Examination-Participation Restrictions  Community Activity;Laundry;Cleaning    Stability/Clinical Decision Making  Evolving/Moderate complexity    Rehab Potential  Excellent    PT Frequency  2x / week    PT Duration  8 weeks    PT  Treatment/Interventions  ADLs/Self Care Home Management;Iontophoresis 31m/ml Dexamethasone;Ultrasound;Therapeutic exercise;Therapeutic activities;Neuromuscular re-education;Patient/family education;Dry needling;Passive range of motion;Manual techniques;Spinal Manipulations;Joint Manipulations    PT Next Visit Plan  Medicaid extension, pt needs HEP for core stabilization    PT Home Exercise Plan  Access Code: [email or web address]   Consulted and Agree with Plan of Care  Patient       Patient will benefit from skilled therapeutic intervention in order to improve the following deficits and impairments:  Decreased range of motion, Increased fascial restricitons, Increased muscle spasms, Pain, Decreased mobility, Decreased strength  Visit Diagnosis: Muscle weakness (generalized)  Pain in left hip  Pain in right hip  Cervicalgia  Acute pain of left shoulder     Problem List Patient Active Problem List   Diagnosis Date Noted  . IUD (intrauterine device) in place 03/01/2019  . Preeclampsia in postpartum period 02/15/2019  . Labor and delivery, indication for care 02/01/2019  . [redacted] weeks gestation of pregnancy 02/01/2019  . HSV (herpes simplex virus) infection   . Back pain affecting pregnancy   . Supervision of other normal pregnancy, antepartum 06/04/2014    Kelly Rush, PTA 04/29/2019, 3:42 PM  [facility] [address] [place] [address]Phone: [phone]  Fax:  [phone] Name: Kelly MontelMRN: [id number] of Birth: [birth year]

## 2019-04-29 NOTE — Therapy (Signed)
[facility] [address], [place], [place], [address] Phone: [phone]   Fax:  [phone]  Physical Therapy Treatment  Patient Details  Name: Kelly Rush MRN: [id number] Date of Birth: 1986-03-07 Referring Provider (PT): Dr. Clayton Lefort   Encounter Date: 04/29/2019  PT End of Session - 04/29/19 1501    Visit Number  3    Date for PT Re-Evaluation  06/05/19    Authorization Type  Medicaid    Authorization Time Period  12/24/18 to 03/17/2019    Authorization - Visit Number  11    PT Start Time  1500   15 min late   PT Stop Time  1545    PT Time Calculation (min)  45 min    Activity Tolerance  Patient tolerated treatment well    Behavior During Therapy  [facility] for tasks assessed/performed       Past Medical History:  Diagnosis Date  . Anemia   . HSV (herpes simplex virus) infection     No past surgical history on file.  There were no vitals filed for this visit.      [facility] PT Assessment - 04/29/19 0001      Assessment   Medical Diagnosis  M62.838 Muscle spasm    Referring Provider (PT)  Dr. Clayton Lefort    Onset Date/Surgical Date  02/01/19      AROM   Cervical Flexion  35    Cervical Extension  25   questionable effort   Cervical - Right Side Bend  30    Cervical - Left Side Bend  32    Cervical - Right Rotation  70    Cervical - Left Rotation  60    Lumbar Flexion  full    Lumbar - Right Side Bend  WNL    Lumbar - Right Rotation  WNL      Strength   Right Shoulder Flexion  4/5    Right Shoulder External Rotation  4/5    Left Shoulder Flexion  4/5    Left Shoulder External Rotation  4/5    Right Hip Extension  4/5    Right Hip ABduction  4/5    Right Hip ADduction  4/5    Left Hip Extension  4-/5    Left Hip ABduction  4/5   Hip flexors initiate contraction   Left Hip ADduction  4/5                   [facility] Adult PT Treatment/Exercise - 04/29/19 0001      Manual Therapy   Soft tissue mobilization  cervical RT >LT Trigger point release RT SCM and scalenes               PT Short Term Goals - 04/10/19 1224      PT SHORT TERM GOAL #1   Title  independent with initial HEP    Baseline  not educated yet    Time  4    Period  Weeks    Status  New    Target Date  05/08/19        PT Long Term Goals - 04/29/19 1502      PT LONG TERM GOAL #1   Title  independent with HEP and understand how to progress herself    Baseline  still learning    Time  8    Period  Weeks    Status  On-going  PT LONG TERM GOAL #2   Title  understand ways to manage her pain with correct body mechanics with daily activities and caring for her children    Time  8    Period  Weeks    Status  Achieved   Not looking down a s much     PT LONG TERM GOAL #3   Status  --   7-8/10 on avg/daily     PT LONG TERM GOAL #4   Title  diastasis recti is </= 1 finger width apart due to decreased fascial restrictions and the fascial support has improved    Baseline  diastasis recti is 2 fingers width and patient bulges her abdomen out of it    Time  8    Period  Weeks    Status  On-going      PT LONG TERM GOAL #5   Title  pain with bilateral hips is </= 2/10 due to improve strength >/= 4+/5 and ability to perform her chores with greater ease    Time  8    Period  Weeks    Status  Partially Met   hip pain abolished but still weak.           Plan - 04/29/19 1529    Clinical Impression Statement  Pt arrived 15 min late for appt. Cervical AROM essentially the same. Pt does report the pain is slightly less now than on initial eval. She had found not looking down as much has been very helpful and doing more stretches throughout the day. Pt's LTLE is still weak in extension but improved in abduction. Pt progressing towards all LTGs. Pt remains with 2 finger width diastis.    Personal Factors and Comorbidities  Comorbidity 1    Comorbidities  diastasis recti,  had a baby on 02/01/19    Examination-Activity Limitations  Bed Mobility;Stand;Squat;Sit    Examination-Participation Restrictions  Community Activity;Laundry;Cleaning    Stability/Clinical Decision Making  Evolving/Moderate complexity    Rehab Potential  Excellent    PT Frequency  2x / week    PT Duration  8 weeks    PT Treatment/Interventions  ADLs/Self Care Home Management;Iontophoresis 68m/ml Dexamethasone;Ultrasound;Therapeutic exercise;Therapeutic activities;Neuromuscular re-education;Patient/family education;Dry needling;Passive range of motion;Manual techniques;Spinal Manipulations;Joint Manipulations    PT Next Visit Plan  Medicaid extension, pt needs HEP for core stabilization    PT Home Exercise Plan  Access Code: [email or web address]   Consulted and Agree with Plan of Care  Patient       Patient will benefit from skilled therapeutic intervention in order to improve the following deficits and impairments:  Decreased range of motion, Increased fascial restricitons, Increased muscle spasms, Pain, Decreased mobility, Decreased strength  Visit Diagnosis: Muscle weakness (generalized)  Pain in left hip  Pain in right hip  Cervicalgia  Acute pain of left shoulder     Problem List Patient Active Problem List   Diagnosis Date Noted  . IUD (intrauterine device) in place 03/01/2019  . Preeclampsia in postpartum period 02/15/2019  . Labor and delivery, indication for care 02/01/2019  . [redacted] weeks gestation of pregnancy 02/01/2019  . HSV (herpes simplex virus) infection   . Back pain affecting pregnancy   . Supervision of other normal pregnancy, antepartum 06/04/2014    Chia Mowers, PTA 04/29/2019, 3:47 PM  [facility] [address] [place] [address]Phone: [phone]  Fax:  [phone] Name: MKeerstin BjellandMRN: [id number] of Birth: 109-08-1985

## 2019-05-01 ENCOUNTER — Ambulatory Visit: Payer: Medicaid Other

## 2019-05-06 ENCOUNTER — Ambulatory Visit: Payer: Medicaid Other | Admitting: Physical Therapy

## 2019-05-08 ENCOUNTER — Other Ambulatory Visit: Payer: Self-pay

## 2019-05-08 ENCOUNTER — Encounter: Payer: Self-pay | Admitting: Physical Therapy

## 2019-05-08 ENCOUNTER — Encounter: Payer: Medicaid Other | Admitting: Physical Therapy

## 2019-05-08 ENCOUNTER — Ambulatory Visit: Payer: Medicaid Other | Attending: Obstetrics and Gynecology | Admitting: Physical Therapy

## 2019-05-08 DIAGNOSIS — M25552 Pain in left hip: Secondary | ICD-10-CM | POA: Diagnosis present

## 2019-05-08 DIAGNOSIS — M6281 Muscle weakness (generalized): Secondary | ICD-10-CM | POA: Insufficient documentation

## 2019-05-08 DIAGNOSIS — M62838 Other muscle spasm: Secondary | ICD-10-CM | POA: Diagnosis present

## 2019-05-08 DIAGNOSIS — M542 Cervicalgia: Secondary | ICD-10-CM | POA: Insufficient documentation

## 2019-05-08 DIAGNOSIS — M25511 Pain in right shoulder: Secondary | ICD-10-CM | POA: Diagnosis present

## 2019-05-08 DIAGNOSIS — R519 Headache, unspecified: Secondary | ICD-10-CM | POA: Insufficient documentation

## 2019-05-08 DIAGNOSIS — M25512 Pain in left shoulder: Secondary | ICD-10-CM | POA: Diagnosis present

## 2019-05-08 DIAGNOSIS — M25551 Pain in right hip: Secondary | ICD-10-CM | POA: Insufficient documentation

## 2019-05-08 NOTE — Patient Instructions (Signed)

## 2019-05-08 NOTE — Therapy (Signed)
[facility] [address] [place], [place], [address] Phone: [phone]   Fax:  [phone]  Physical Therapy Treatment  Patient Details  Name: Kelly Rush MRN: [id number] Date of Birth: 1985/11/22 Referring Provider (PT): Dr. Clayton Lefort   Encounter Date: 05/08/2019  PT End of Session - 05/08/19 1630    Visit Number  1    Number of Visits  3    Date for PT Re-Evaluation  06/05/19    Authorization Type  Medicaid    Authorization Time Period  12/24/18 to 03/17/2019    Authorization - Visit Number  1    Authorization - Number of Visits  3    PT Start Time  1628    PT Stop Time  1706    PT Time Calculation (min)  38 min    Activity Tolerance  Patient tolerated treatment well    Behavior During Therapy  [facility] for tasks assessed/performed       Past Medical History:  Diagnosis Date  . Anemia   . HSV (herpes simplex virus) infection     History reviewed. No pertinent surgical history.  There were no vitals filed for this visit.  Subjective Assessment - 05/08/19 1633    Subjective  Neck still hurts, been a lot on the computer lately/school stuff.    Currently in Pain?  Yes   Neck & shoulders   Pain Score  7     Pain Orientation  Right;Left    Pain Descriptors / Indicators  Sore;Tightness    Aggravating Factors   when school work is up    Pain Relieving Factors  laying down    Multiple Pain Sites  No                       [facility] Adult PT Treatment/Exercise - 05/08/19 0001      Manual Therapy   Soft tissue mobilization  cervical RT >LT Trigger point release RT SCM and scalenes             PT Education - 05/08/19 1637    Education Details  HEP supine core/TA work    [employer]) Educated  Patient    Methods  Explanation;Demonstration;Tactile cues;Verbal cues;Handout    Comprehension  Returned demonstration;Verbalized understanding       PT Short Term Goals - 04/10/19 1224       PT SHORT TERM GOAL #1   Title  independent with initial HEP    Baseline  not educated yet    Time  4    Period  Weeks    Status  New    Target Date  05/08/19        PT Long Term Goals - 04/29/19 1502      PT LONG TERM GOAL #1   Title  independent with HEP and understand how to progress herself    Baseline  still learning    Time  8    Period  Weeks    Status  On-going      PT LONG TERM GOAL #2   Title  understand ways to manage her pain with correct body mechanics with daily activities and caring for her children    Time  8    Period  Weeks    Status  Achieved   Not looking down a s much     PT LONG TERM GOAL #3   Status  --   7-8/10 on avg/daily  PT LONG TERM GOAL #4   Title  diastasis recti is </= 1 finger width apart due to decreased fascial restrictions and the fascial support has improved    Baseline  diastasis recti is 2 fingers width and patient bulges her abdomen out of it    Time  8    Period  Weeks    Status  On-going      PT LONG TERM GOAL #5   Title  pain with bilateral hips is </= 2/10 due to improve strength >/= 4+/5 and ability to perform her chores with greater ease    Time  8    Period  Weeks    Status  Partially Met   hip pain abolished but still weak.           Plan - 05/08/19 1707    Clinical Impression Statement  Pt arrives with neck and shoulder pain, no hip or back pain. HEP for core stabilization was added. Pt's ervical soft tissues showing some improvement at rest. She reports she has been trying harder to be more compliant with her stretches and her UE exercises. She does report holding her baby is not as much a strain like it used to be.    Personal Factors and Comorbidities  Comorbidity 1    Comorbidities  diastasis recti, had a baby on 02/01/19    Examination-Activity Limitations  Bed Mobility;Stand;Squat;Sit    Examination-Participation Restrictions  Community Activity;Laundry;Cleaning    Stability/Clinical Decision Making   Evolving/Moderate complexity    Rehab Potential  Excellent    PT Frequency  2x / week    PT Duration  8 weeks    PT Treatment/Interventions  ADLs/Self Care Home Management;Iontophoresis 91m/ml Dexamethasone;Ultrasound;Therapeutic exercise;Therapeutic activities;Neuromuscular re-education;Patient/family education;Dry needling;Passive range of motion;Manual techniques;Spinal Manipulations;Joint Manipulations    PT Next Visit Plan  review core stabs given today, manual to cervical    PT Home Exercise Plan  Access Code: [email or web address]   Consulted and Agree with Plan of Care  Patient       Patient will benefit from skilled therapeutic intervention in order to improve the following deficits and impairments:  Decreased range of motion, Increased fascial restricitons, Increased muscle spasms, Pain, Decreased mobility, Decreased strength  Visit Diagnosis: Muscle weakness (generalized)  Pain in left hip  Pain in right hip  Cervicalgia  Acute pain of left shoulder  Acute pain of right shoulder  Frequent headaches  Other muscle spasm     Problem List Patient Active Problem List   Diagnosis Date Noted  . IUD (intrauterine device) in place 03/01/2019  . Preeclampsia in postpartum period 02/15/2019  . Labor and delivery, indication for care 02/01/2019  . [redacted] weeks gestation of pregnancy 02/01/2019  . HSV (herpes simplex virus) infection   . Back pain affecting pregnancy   . Supervision of other normal pregnancy, antepartum 06/04/2014    Ivaan Liddy, PTA 05/08/2019, 5:09 PM  [facility] [address] [place] [address]Phone: [phone]  Fax:  [phone] Name: Kelly MayedaMRN: [id number] of Birth: 1September 25, 1987

## 2019-05-09 ENCOUNTER — Other Ambulatory Visit: Payer: Self-pay

## 2019-05-09 ENCOUNTER — Ambulatory Visit (INDEPENDENT_AMBULATORY_CARE_PROVIDER_SITE_OTHER): Payer: No Typology Code available for payment source | Admitting: Clinical

## 2019-05-09 DIAGNOSIS — F4322 Adjustment disorder with anxiety: Secondary | ICD-10-CM | POA: Diagnosis not present

## 2019-05-13 ENCOUNTER — Ambulatory Visit: Payer: Medicaid Other | Admitting: Physical Therapy

## 2019-05-15 ENCOUNTER — Ambulatory Visit: Payer: Medicaid Other | Admitting: Physical Therapy

## 2019-05-20 ENCOUNTER — Other Ambulatory Visit: Payer: Self-pay

## 2019-05-20 ENCOUNTER — Ambulatory Visit: Payer: Medicaid Other | Admitting: Physical Therapy

## 2019-05-20 ENCOUNTER — Encounter: Payer: Self-pay | Admitting: Physical Therapy

## 2019-05-20 DIAGNOSIS — M6281 Muscle weakness (generalized): Secondary | ICD-10-CM

## 2019-05-20 DIAGNOSIS — M25552 Pain in left hip: Secondary | ICD-10-CM

## 2019-05-20 DIAGNOSIS — M25551 Pain in right hip: Secondary | ICD-10-CM

## 2019-05-20 DIAGNOSIS — M542 Cervicalgia: Secondary | ICD-10-CM

## 2019-05-20 NOTE — BH Specialist Note (Signed)
Integrated Behavioral Health via Telemedicine Video Visit  05/20/2019 Kelly Rush [id number]  Number of Integrated Behavioral Health visits: 2 Session Start time: 2:47  Session End time: 3:12 Total time: 25  Referring Provider: Donia Ast, NP Type of Visit: Video [facility] Provider location: [facility] All persons participating in visit: Patient Kelly Rush and [name] [name]    Confirmed patient's address: Yes  Confirmed patient's phone number: Yes  Any changes to demographics: No   Confirmed patient's insurance: Yes  Any changes to patient's insurance: No   Discussed confidentiality: At previous visit  I connected with Kelly Rush  by a video enabled telemedicine application and verified that I am speaking with the correct person using two identifiers.     I discussed the limitations of evaluation and management by telemedicine and the availability of in person appointments.  I discussed that the purpose of this visit is to provide behavioral health care while limiting exposure to the novel coronavirus.   Discussed there is a possibility of technology failure and discussed alternative modes of communication if that failure occurs.  I discussed that engaging in this video visit, they consent to the provision of behavioral healthcare and the services will be billed under their insurance.  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  Duration of problem: Increase in perintal; Severity of problem: moderate  STRENGTHS (Protective Factors/Coping Skills): Self-aware; implementing self-coping strategies  GOALS ADDRESSED: Patient will: 1.  Reduce symptoms of: anxiety  2.   Increase knowledge and/or ability of: healthy habits and self-management skills  3.  Demonstrate ability to: Increase healthy adjustment to current life circumstances  INTERVENTIONS: Interventions utilized:  Solution-Focused Strategies, Sleep Hygiene and Psychoeducation and/or Health Education Standardized Assessments completed: GAD-7 and PHQ 9  ASSESSMENT: Patient currently experiencing Adjustment disorder with anxiety .   Patient may benefit from psychoeducation and brief therapeutic interventions regarding coping with symptoms of anxiety and depression .  PLAN: 1. Follow up with behavioral health clinician on : Two weeks 2. Behavioral recommendations:  -Begin using "boring" audiobook at bedtime for improved sleep -Schedule 15 minutes in the morning; 15 minutes in the afternoon, to be used exclusively in planning for online business -Continue relaxation breathing exercises, audiobooks, and worry time strategies daily for self-care -Consider registering for and attending new mom support group ([email or web address] or [email or web address].net)  3. Referral(s): Integrated [employer] (In Clinic)  I discussed the assessment and treatment plan with the patient and/or parent/guardian. They were provided an opportunity to ask questions and all were answered. They agreed with the plan and demonstrated an understanding of the instructions.   They were advised to call back or seek an in-person evaluation if the symptoms worsen or if the condition fails to improve as anticipated.  Kelly Rush Kelly Rush   Depression screen [facility] 2/9 05/23/2019 05/09/2019  Decreased Interest 1 1  Down, Depressed, Hopeless 0 0  PHQ - 2 Score 1 1  Altered sleeping 3 1  Tired, decreased energy 3 3  Change in appetite 1 0  Feeling bad or failure about yourself  1 1  Trouble concentrating 1 0  Moving slowly or fidgety/restless 0 0  Suicidal thoughts 0 0  PHQ-9 Score 10 6   GAD 7 : Generalized Anxiety Score  05/23/2019 05/09/2019 04/09/2019  Nervous, Anxious, on Edge 1 3 3   Control/stop worrying 1 3 1   Worry too much - different things 1 3 3   Trouble relaxing 1 3 1   Restless 0 1 1  Easily annoyed or irritable 2 3 1   Afraid - awful might happen 1 1 1   Total GAD 7 Score 7 17 11   Anxiety Difficulty - - Somewhat difficult

## 2019-05-20 NOTE — Therapy (Signed)
[facility] [address] [place], [place], [address] Phone: [phone]   Fax:  [phone]  Physical Therapy Treatment  Patient Details  Name: Kelly Rush MRN: [id number] Date of Birth: 15-May-1985 Referring Provider (PT): Dr. Clayton Lefort   Encounter Date: 05/20/2019  PT End of Session - 05/20/19 1540    Visit Number  2    Date for PT Re-Evaluation  07/31/19    Authorization Type  Medicaid    Authorization Time Period  05/06/2019-05/26/2019    Authorization - Visit Number  2    Authorization - Number of Visits  3    PT Start Time  3976    PT Stop Time  1610    PT Time Calculation (min)  38 min    Activity Tolerance  Patient tolerated treatment well;No increased pain    Behavior During Therapy  [facility] for tasks assessed/performed       Past Medical History:  Diagnosis Date  . Anemia   . HSV (herpes simplex virus) infection     History reviewed. No pertinent surgical history.  There were no vitals filed for this visit.  Subjective Assessment - 05/20/19 1535    Subjective  My pain is getting better. I still have tension in my neck and shoulders and having less headaches. Patient reports 50% less headaches.    Limitations  Standing;Sitting;Walking    How long can you sit comfortably?  30 min    How long can you stand comfortably?  30 min    How long can you walk comfortably?  15 min    Patient Stated Goals  get diastasis worked on , reduce headaches by working on neck and shoulders    Currently in Pain?  Yes    Pain Score  7     Pain Location  Neck    Pain Orientation  Right    Pain Descriptors / Indicators  Sore;Tightness    Pain Type  Acute pain    Pain Onset  More than a month ago    Pain Frequency  Constant    Aggravating Factors   taking care of baby, look behind him, daily activities    Pain Relieving Factors  laying down    Multiple Pain Sites  No         [facility] PT Assessment - 05/20/19  0001      Assessment   Medical Diagnosis  M62.838 Muscle spasm    Referring Provider (PT)  Dr. Clayton Lefort    Onset Date/Surgical Date  02/01/19      Restrictions   Weight Bearing Restrictions  No      Cognition   Overall Cognitive Status  Within Functional Limits for tasks assessed      AROM   Cervical Flexion  45    Cervical Extension  50    Cervical - Right Side Bend  30    Cervical - Left Side Bend  30    Cervical - Right Rotation  70    Cervical - Left Rotation  60    Lumbar Flexion  full    Lumbar - Right Side Bend  WNL    Lumbar - Right Rotation  WNL      Strength   Right Shoulder Flexion  4+/5    Right Shoulder External Rotation  4+/5    Left Shoulder Flexion  4+/5    Left Shoulder External Rotation  4+/5    Right Hip Extension  4/5    Right Hip ABduction  4+/5    Right Hip ADduction  5/5    Left Hip Extension  4/5    Left Hip ABduction  4/5    Left Hip ADduction  5/5                Pelvic Floor Special Questions - 05/20/19 0001    Diastasis Recti  2 fingers above the umbilicus and 1 finger width just below the xiphoid process and 2 fingers width below the umbilicus; feel tension of the tissue in the diastasis recti        [facility] Adult PT Treatment/Exercise - 05/20/19 0001      Lumbar Exercises: Supine   Clam  10 reps;1 second    Clam Limitations  each side palpating the abdominals  to engage the core    Heel Slides  10 reps    Heel Slides Limitations  each side engaging the core    Bent Knee Raise  10 reps    Bent Knee Raise Limitations  each side engaging the core    Other Supine Lumbar Exercises  decompression on the foam roll to decrease stress on the spine    Other Supine Lumbar Exercises  lay on foam roll alterate shoulder flexion 20x, bil. shoulder horizontal abduction with green band      Lumbar Exercises: Quadruped   Other Quadruped Lumbar Exercises  move from thread the needle to lifting arm up to the sky 10x each side to improve  rotation      Manual Therapy   Manual Therapy  Joint mobilization    Joint Mobilization  gapping and upslide glide of C2-T3 bil. with rotation and sidebending               PT Short Term Goals - 04/10/19 1224      PT SHORT TERM GOAL #1   Title  independent with initial HEP    Baseline  not educated yet    Time  4    Period  Weeks    Status  New    Target Date  05/08/19        PT Long Term Goals - 05/20/19 1544      PT LONG TERM GOAL #1   Title  independent with HEP and understand how to progress herself    Baseline  still learning    Time  8    Period  Weeks    Status  On-going      PT LONG TERM GOAL #2   Title  understand ways to manage her pain with correct body mechanics with daily activities and caring for her children    Time  8    Period  Weeks    Status  Achieved      PT LONG TERM GOAL #3   Title  cervical pain level decreased >/= 2/10 so her headaches happen </= 1x per every 2 weeks    Baseline  pain level is 7/10 and headaches are daily; headaches are 45%    Time  8    Period  Weeks    Status  On-going      PT LONG TERM GOAL #4   Title  diastasis recti is </= 1 finger width apart due to decreased fascial restrictions and the fascial support has improved      PT LONG TERM GOAL #5   Title  pain with bilateral hips is </= 2/10 due to improve strength >/=  4+/5 and ability to perform her chores with greater ease    Baseline  pain level is 5/10    Time  8    Period  Weeks    Status  On-going            Plan - 05/20/19 1544    Clinical Impression Statement  Patient has tightness in the the cervical facets on the right side restricting movement. Patient has limited cervical ROM for sidebending and rotation. Patient bilateral shoulder strength is 4+/5. Patient bilateral strength 4/5. Diastasis recti is 2 fingers width above the umbilicus and 1 finger  with abdominal strength 2/5. Pain with hips is 5/10 with daily activities. Patient headaches are  45% better and neck pain could get to 7/10. Patient will benefit from skilled therapy to improve mobility, decrease pain with increasing strength.    Personal Factors and Comorbidities  Comorbidity 1;Social Background    Comorbidities  diastasis recti, had a baby on 02/01/19    Examination-Activity Limitations  Bed Mobility;Stand;Squat;Sit    Examination-Participation Restrictions  Community Activity;Laundry;Cleaning    Stability/Clinical Decision Making  Evolving/Moderate complexity    Rehab Potential  Excellent    PT Frequency  1x / week    PT Duration  8 weeks    PT Treatment/Interventions  ADLs/Self Care Home Management;Iontophoresis 4mg /ml Dexamethasone;Ultrasound;Therapeutic exercise;Therapeutic activities;Neuromuscular re-education;Patient/family education;Dry needling;Passive range of motion;Manual techniques;Spinal Manipulations;Joint Manipulations    PT Next Visit Plan  work on core to reduce the diastasis with hip strength, cervical ROM with trunk movement; forward info to PT to put in Medicaid renewal    PT Home Exercise Plan  Access Code:    Recommended Other Services  sent renewal to MD    Consulted and Agree with Plan of Care  Patient       Patient will benefit from skilled therapeutic intervention in order to improve the following deficits and impairments:  Decreased range of motion, Increased fascial restricitons, Increased muscle spasms, Pain, Decreased mobility, Decreased strength  Visit Diagnosis: Muscle weakness (generalized) - Plan: PT plan of care cert/re-cert  Pain in left hip - Plan: PT plan of care cert/re-cert  Pain in right hip - Plan: PT plan of care cert/re-cert  Cervicalgia - Plan: PT plan of care cert/re-cert     Problem List Patient Active Problem List   Diagnosis Date Noted  . IUD (intrauterine device) in place 03/01/2019  . Preeclampsia in postpartum period 02/15/2019  . Labor and delivery, indication for care 02/01/2019  . [redacted] weeks  gestation of pregnancy 02/01/2019  . HSV (herpes simplex virus) infection   . Back pain affecting pregnancy   . Supervision of other normal pregnancy, antepartum 06/04/2014   08/03/2014, PT 05/20/19 4:16 PM    [facility] [address] [place], [place], [place] Phone: [phone]   Fax:  [phone]  Name: Kelly Rush MRN: [name] Date of Birth: [birth year]

## 2019-05-22 ENCOUNTER — Encounter: Payer: Self-pay | Admitting: Physical Therapy

## 2019-05-22 ENCOUNTER — Ambulatory Visit: Payer: Medicaid Other | Admitting: Physical Therapy

## 2019-05-22 ENCOUNTER — Other Ambulatory Visit: Payer: Self-pay

## 2019-05-22 DIAGNOSIS — M6281 Muscle weakness (generalized): Secondary | ICD-10-CM | POA: Diagnosis not present

## 2019-05-22 DIAGNOSIS — M62838 Other muscle spasm: Secondary | ICD-10-CM

## 2019-05-22 DIAGNOSIS — M25511 Pain in right shoulder: Secondary | ICD-10-CM

## 2019-05-22 DIAGNOSIS — M542 Cervicalgia: Secondary | ICD-10-CM

## 2019-05-22 DIAGNOSIS — R519 Headache, unspecified: Secondary | ICD-10-CM

## 2019-05-22 DIAGNOSIS — M25552 Pain in left hip: Secondary | ICD-10-CM

## 2019-05-22 DIAGNOSIS — M25551 Pain in right hip: Secondary | ICD-10-CM

## 2019-05-22 DIAGNOSIS — M25512 Pain in left shoulder: Secondary | ICD-10-CM

## 2019-05-22 NOTE — Therapy (Addendum)
[facility] [address] [place], [place], [address] Phone: [phone]   Fax:  [phone]  Physical Therapy Treatment  Patient Details  Name: Kelly Rush MRN: [id number] Date of Birth: Dec 02, 1985 Referring Provider (PT): Dr. Clayton Lefort   Encounter Date: 05/22/2019  PT End of Session - 05/22/19 1653    Visit Number  3    Number of Visits  3    Date for PT Re-Evaluation  07/31/19    Authorization Type  Medicaid    Authorization Time Period  05/06/2019-05/26/2019    Authorization - Visit Number  3    Authorization - Number of Visits  3    PT Start Time  1616    PT Stop Time  1654    PT Time Calculation (min)  38 min    Activity Tolerance  Patient tolerated treatment well    Behavior During Therapy  [facility] for tasks assessed/performed       Past Medical History:  Diagnosis Date  . Anemia   . HSV (herpes simplex virus) infection     History reviewed. No pertinent surgical history.  There were no vitals filed for this visit.  Subjective Assessment - 05/22/19 1622    Subjective  Overall better, but for some reason my shoulders up my neck is really hurting today.    Currently in Pain?  Yes    Pain Score  8     Pain Location  Shoulder    Pain Orientation  Right;Left    Pain Descriptors / Indicators  Sore                       [facility] Adult PT Treatment/Exercise - 05/22/19 0001      Lumbar Exercises: Stretches   Other Lumbar Stretch Exercise  open book stretch bil 10x      Lumbar Exercises: Supine   Other Supine Lumbar Exercises  decompression on the foam roll to decrease stress on the spine   added shoulder AROM in all planes 10x each      Manual Therapy   Manual Therapy  Joint mobilization    Soft tissue mobilization  Bil upper traps, cervical/Bil SCM               PT Short Term Goals - 04/10/19 1224      PT SHORT TERM GOAL #1   Title  independent with initial HEP    Baseline  not educated yet    Time  4    Period  Weeks    Status  New    Target Date  05/08/19        PT Long Term Goals - 05/20/19 1544      PT LONG TERM GOAL #1   Title  independent with HEP and understand how to progress herself    Baseline  still learning    Time  8    Period  Weeks    Status  On-going      PT LONG TERM GOAL #2   Title  understand ways to manage her pain with correct body mechanics with daily activities and caring for her children    Time  8    Period  Weeks    Status  Achieved      PT LONG TERM GOAL #3   Title  cervical pain level decreased >/= 2/10 so her headaches happen </= 1x per every 2 weeks    Baseline  pain level is 7/10 and headaches are daily; headaches are 45%    Time  8    Period  Weeks    Status  On-going      PT LONG TERM GOAL #4   Title  diastasis recti is </= 1 finger width apart due to decreased fascial restrictions and the fascial support has improved      PT LONG TERM GOAL #5   Title  pain with bilateral hips is </= 2/10 due to improve strength >/= 4+/5 and ability to perform her chores with greater ease    Baseline  pain level is 5/10    Time  8    Period  Weeks    Status  On-going            Plan - 05/22/19 1655    Clinical Impression Statement  Pt arrives with complaints of bil "shoulder pain" which appears to be upper trap related. Pain initial was rated high 8/10, post stretching,AROM, and manual soft tissue work pain was 1-2/10. Pt reports she is going to look into buying a soft foam roll for home use.    Personal Factors and Comorbidities  Comorbidity 1;Social Background    Examination-Activity Limitations  Bed Mobility;Stand;Squat;Sit    Examination-Participation Restrictions  Community Activity;Laundry;Cleaning    Stability/Clinical Decision Making  Evolving/Moderate complexity    Rehab Potential  Excellent    PT Frequency  1x / week    PT Duration  8 weeks    PT Treatment/Interventions  ADLs/Self Care Home  Management;Iontophoresis 2m/ml Dexamethasone;Ultrasound;Therapeutic exercise;Therapeutic activities;Neuromuscular re-education;Patient/family education;Dry needling;Passive range of motion;Manual techniques;Spinal Manipulations;Joint Manipulations    PT Next Visit Plan  Await Medicid renewal    PT Home Exercise Plan  Access Code: [email or web address]   Consulted and Agree with Plan of Care  Patient       Patient will benefit from skilled therapeutic intervention in order to improve the following deficits and impairments:  Decreased range of motion, Increased fascial restricitons, Increased muscle spasms, Pain, Decreased mobility, Decreased strength  Visit Diagnosis: Muscle weakness (generalized)  Pain in left hip  Pain in right hip  Cervicalgia  Acute pain of left shoulder  Acute pain of right shoulder  Frequent headaches  Other muscle spasm     Problem List Patient Active Problem List   Diagnosis Date Noted  . IUD (intrauterine device) in place 03/01/2019  . Preeclampsia in postpartum period 02/15/2019  . Labor and delivery, indication for care 02/01/2019  . [redacted] weeks gestation of pregnancy 02/01/2019  . HSV (herpes simplex virus) infection   . Back pain affecting pregnancy   . Supervision of other normal pregnancy, antepartum 06/04/2014    Charlesetta Milliron, PTA 05/22/2019, 4:57 PM  [facility] [address] [place] [address]: [phone]  Fax:  [phone] Name: Kelly SchooleyMRN: [id number] of Birth: 106/23/1987 PHYSICAL THERAPY DISCHARGE SUMMARY  Visits from Start of Care: 3  Current functional level related to goals / functional outcomes: See above.    Remaining deficits: See above. Patient had not returned since her last visit on 05/22/2019.    Education / Equipment: HEP Plan:                                                    Patient  goals were not met. Patient is being  discharged due to not returning since the last visit. Thank you for the referral. Earlie Counts, PT 06/18/19 8:51 AM   ?????

## 2019-05-23 ENCOUNTER — Ambulatory Visit (INDEPENDENT_AMBULATORY_CARE_PROVIDER_SITE_OTHER): Payer: Self-pay | Admitting: Clinical

## 2019-05-23 DIAGNOSIS — F4322 Adjustment disorder with anxiety: Secondary | ICD-10-CM

## 2019-06-02 ENCOUNTER — Other Ambulatory Visit: Payer: Self-pay | Admitting: Obstetrics & Gynecology

## 2020-02-10 ENCOUNTER — Other Ambulatory Visit: Payer: Self-pay | Admitting: Obstetrics & Gynecology

## 2020-03-13 ENCOUNTER — Other Ambulatory Visit: Payer: Self-pay | Admitting: Obstetrics & Gynecology

## 2020-06-29 IMAGING — CT CT HEAD W/O CM
4 series · 17 of 47 positions shown, 19 images · non-contrast
Comparison: None.

CLINICAL DATA: Worsening acute headache

EXAM:
CT HEAD WITHOUT CONTRAST
TECHNIQUE: Contiguous axial images were obtained from the base of the skull
through the vertex without intravenous contrast.

[Series 3: head wo · axial · 0.43mm/px · z∈[-106,+14]mm · 7 of 32 slices shown, 9 images]
[im 4/32  brain]
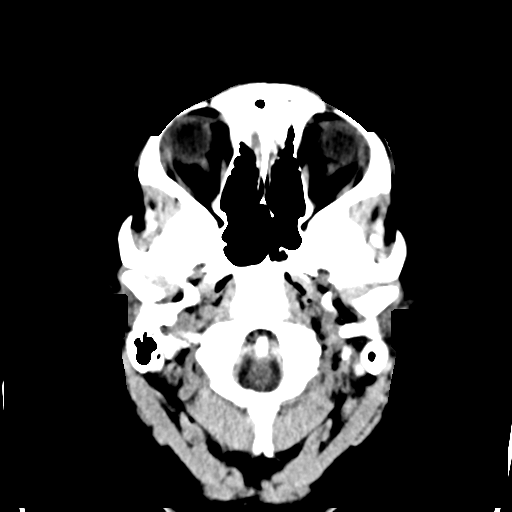
[im 4/32  bone]
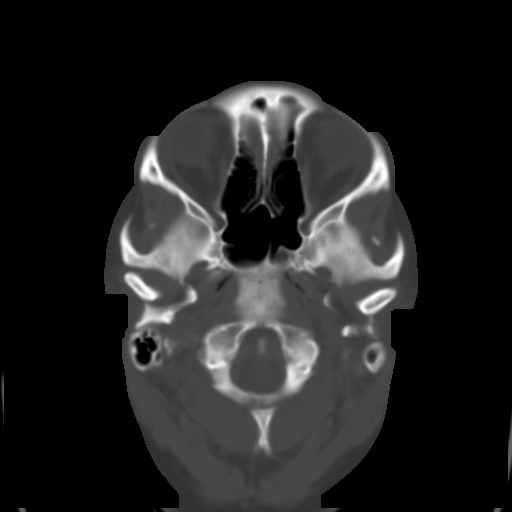
[im 8/32  brain]
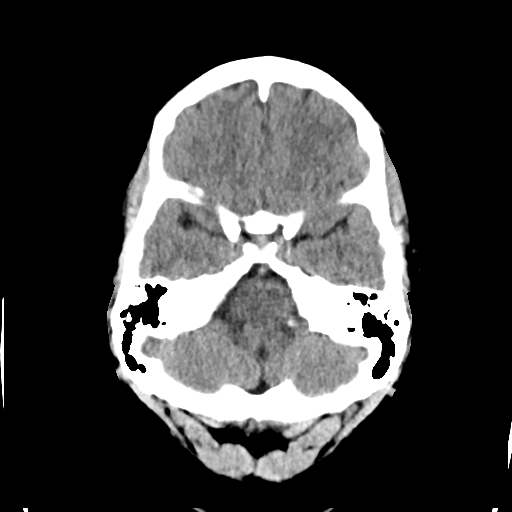
[im 12/32  brain]
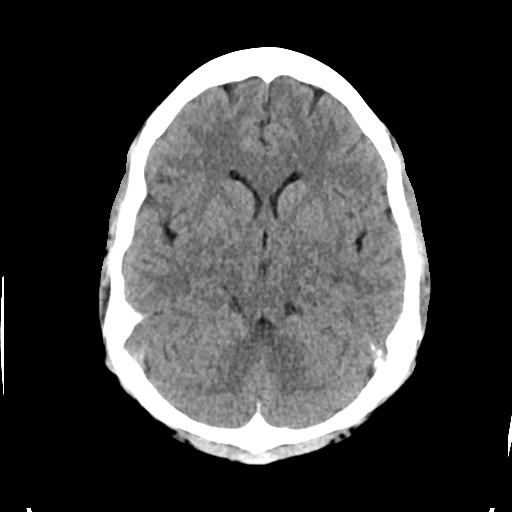
[im 16/32  brain]
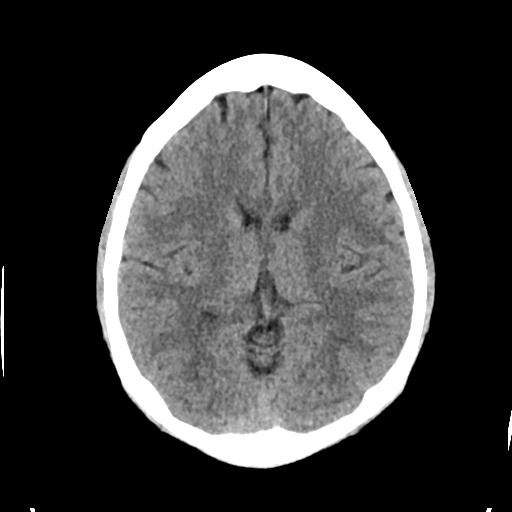
[im 20/32  brain]
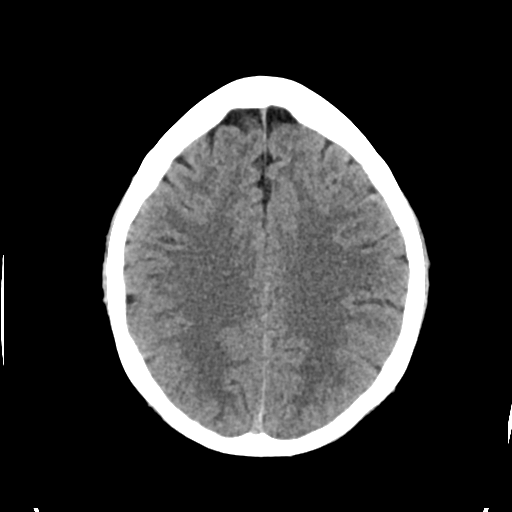
[im 20/32  bone]
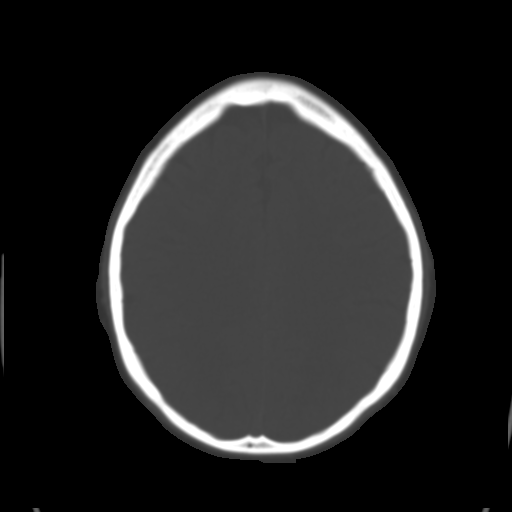
[im 24/32  brain]
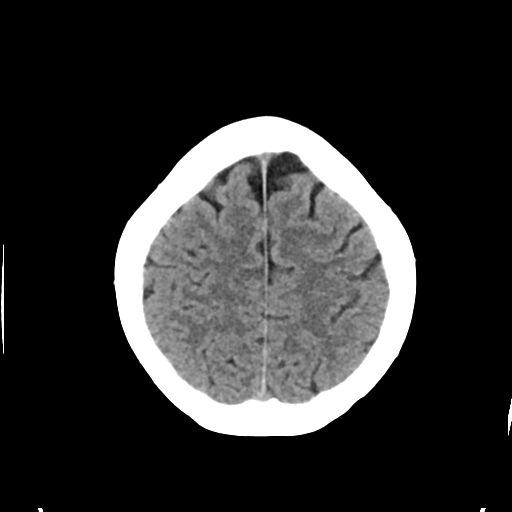
[im 28/32  brain]
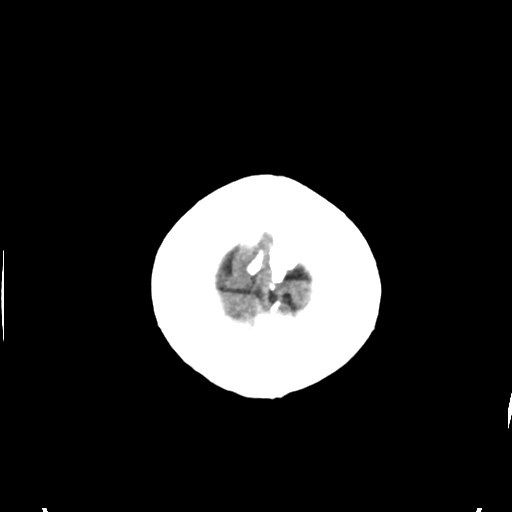

[Series 4: head bone · axial · 0.43mm/px · z∈[-106,-52]mm · 4 of 78 slices shown]
[im 8/78  bone]
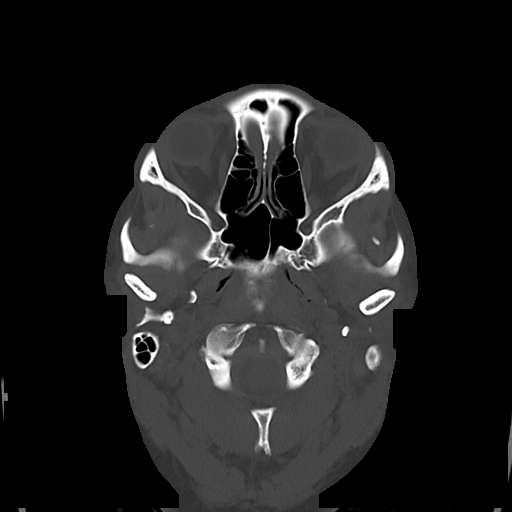
[im 16/78  bone]
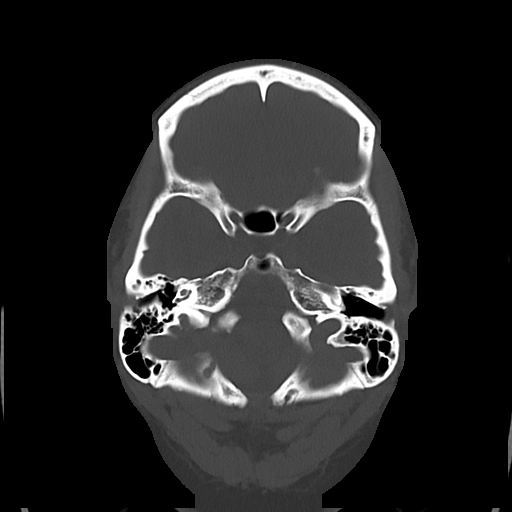
[im 24/78  bone]
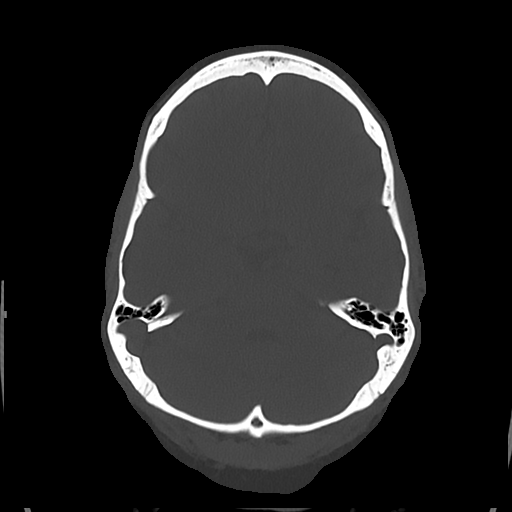
[im 35/78  bone]
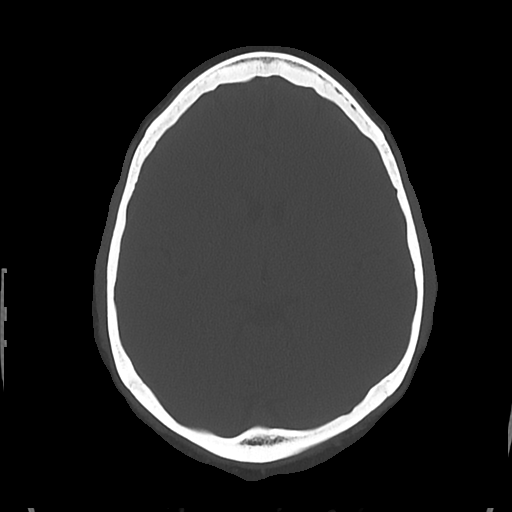

[Series 5: cor soft · coronal · 0.33mm/px · 3 of 66 slices shown]
[im 22/66  brain]
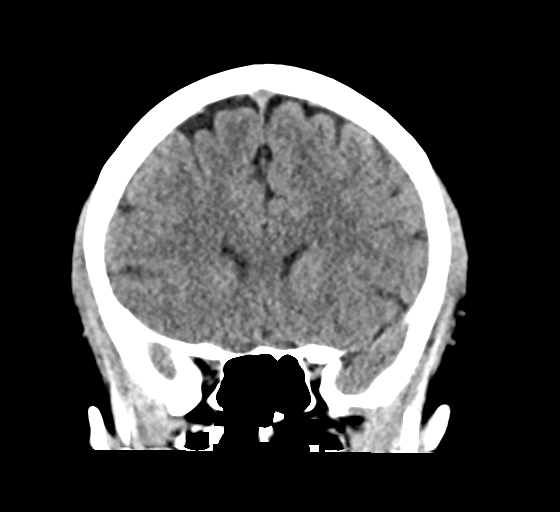
[im 29/66  brain]
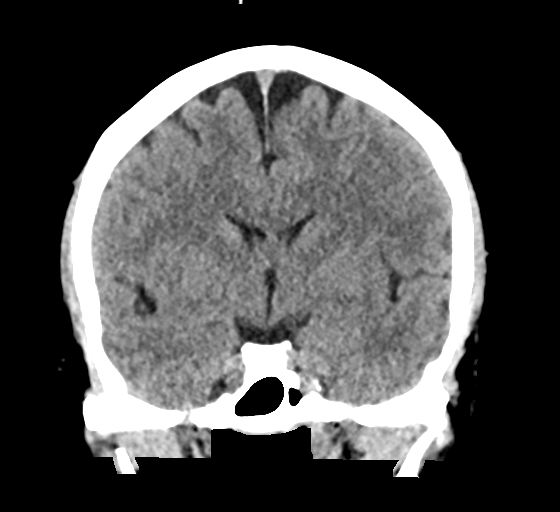
[im 37/66  brain]
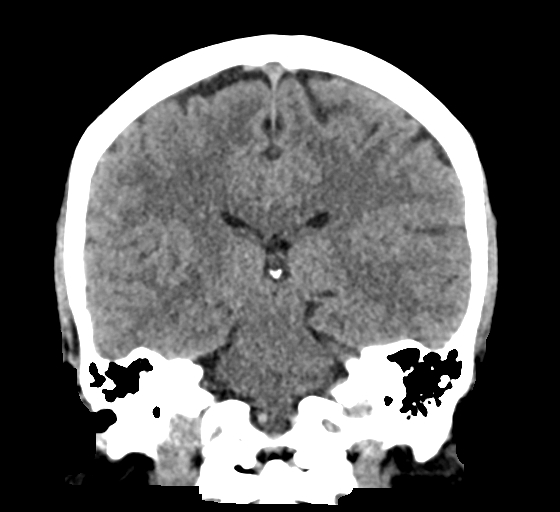

[Series 6: sag soft · sagittal · 0.36mm/px · 3 of 54 slices shown]
[im 18/54  brain]
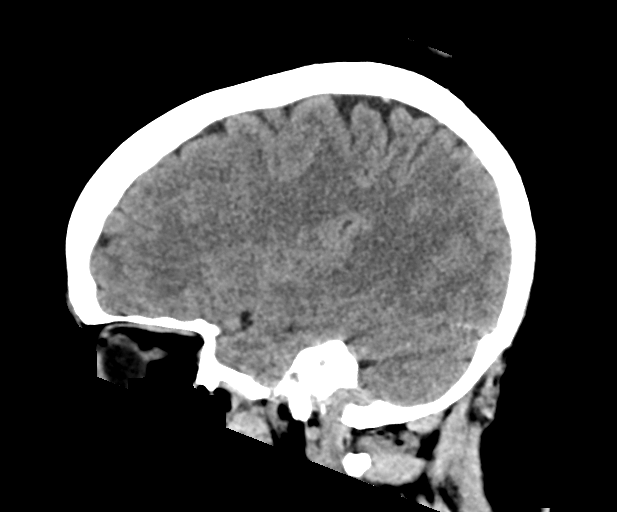
[im 27/54  brain]
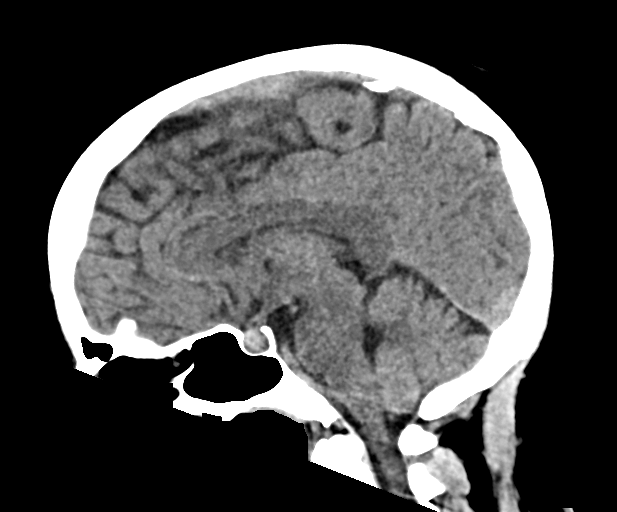
[im 36/54  brain]
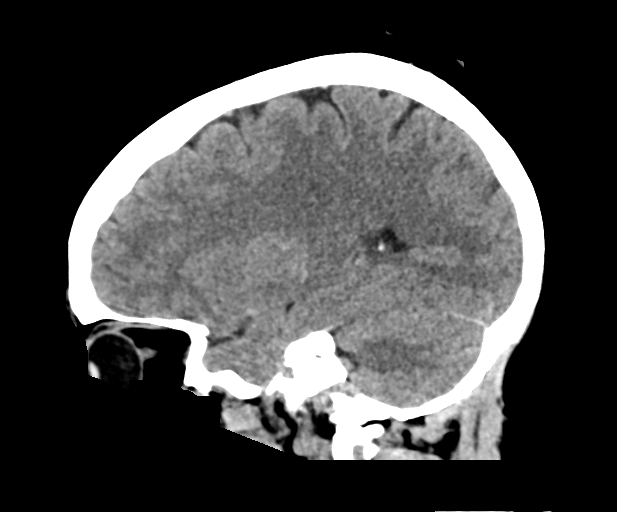

[17 of 47 positions shown; findings below may reference images not displayed]

FINDINGS: Brain: No evidence of acute infarction, hemorrhage, hydrocephalus,
extra-axial collection or mass lesion/mass effect.

Vascular: No hyperdense vessel or unexpected calcification.

Skull: Normal. Negative for fracture or focal lesion.

Sinuses/Orbits: No acute finding.

Other: None.
IMPRESSION: Normal head CT without contrast

## 2020-07-15 DIAGNOSIS — J029 Acute pharyngitis, unspecified: Secondary | ICD-10-CM | POA: Diagnosis not present

## 2020-07-15 DIAGNOSIS — Z6826 Body mass index (BMI) 26.0-26.9, adult: Secondary | ICD-10-CM | POA: Diagnosis not present

## 2020-07-15 DIAGNOSIS — E7800 Pure hypercholesterolemia, unspecified: Secondary | ICD-10-CM | POA: Diagnosis not present

## 2020-07-15 DIAGNOSIS — I10 Essential (primary) hypertension: Secondary | ICD-10-CM | POA: Diagnosis not present

## 2020-07-15 DIAGNOSIS — Z8759 Personal history of other complications of pregnancy, childbirth and the puerperium: Secondary | ICD-10-CM | POA: Diagnosis not present

## 2020-07-22 DIAGNOSIS — Z8759 Personal history of other complications of pregnancy, childbirth and the puerperium: Secondary | ICD-10-CM | POA: Diagnosis not present

## 2020-07-22 DIAGNOSIS — G4440 Drug-induced headache, not elsewhere classified, not intractable: Secondary | ICD-10-CM | POA: Diagnosis not present

## 2020-07-22 DIAGNOSIS — E7800 Pure hypercholesterolemia, unspecified: Secondary | ICD-10-CM | POA: Diagnosis not present

## 2020-07-22 DIAGNOSIS — Z79899 Other long term (current) drug therapy: Secondary | ICD-10-CM | POA: Diagnosis not present

## 2020-07-22 DIAGNOSIS — Z6826 Body mass index (BMI) 26.0-26.9, adult: Secondary | ICD-10-CM | POA: Diagnosis not present

## 2020-07-22 DIAGNOSIS — I10 Essential (primary) hypertension: Secondary | ICD-10-CM | POA: Diagnosis not present

## 2020-09-14 DIAGNOSIS — R079 Chest pain, unspecified: Secondary | ICD-10-CM | POA: Diagnosis not present

## 2020-09-15 DIAGNOSIS — R079 Chest pain, unspecified: Secondary | ICD-10-CM | POA: Diagnosis not present

## 2020-09-15 DIAGNOSIS — I517 Cardiomegaly: Secondary | ICD-10-CM | POA: Diagnosis not present

## 2020-10-28 DIAGNOSIS — I10 Essential (primary) hypertension: Secondary | ICD-10-CM | POA: Diagnosis not present

## 2020-10-28 DIAGNOSIS — Z8759 Personal history of other complications of pregnancy, childbirth and the puerperium: Secondary | ICD-10-CM | POA: Diagnosis not present

## 2020-10-28 DIAGNOSIS — Z79899 Other long term (current) drug therapy: Secondary | ICD-10-CM | POA: Diagnosis not present

## 2020-10-28 DIAGNOSIS — G4440 Drug-induced headache, not elsewhere classified, not intractable: Secondary | ICD-10-CM | POA: Diagnosis not present

## 2020-10-28 DIAGNOSIS — E7800 Pure hypercholesterolemia, unspecified: Secondary | ICD-10-CM | POA: Diagnosis not present

## 2020-10-28 DIAGNOSIS — Z6825 Body mass index (BMI) 25.0-25.9, adult: Secondary | ICD-10-CM | POA: Diagnosis not present

## 2021-01-12 ENCOUNTER — Encounter: Payer: Medicaid Other | Admitting: Advanced Practice Midwife

## 2021-02-10 ENCOUNTER — Other Ambulatory Visit: Payer: Self-pay

## 2021-02-10 ENCOUNTER — Encounter: Payer: Self-pay | Admitting: General Practice

## 2021-02-10 ENCOUNTER — Ambulatory Visit (INDEPENDENT_AMBULATORY_CARE_PROVIDER_SITE_OTHER): Payer: Medicaid Other | Admitting: Obstetrics & Gynecology

## 2021-02-10 ENCOUNTER — Encounter: Payer: Self-pay | Admitting: Obstetrics & Gynecology

## 2021-02-10 ENCOUNTER — Other Ambulatory Visit (HOSPITAL_COMMUNITY)
Admission: RE | Admit: 2021-02-10 | Discharge: 2021-02-10 | Disposition: A | Discharge status: Home / Self Care | Payer: Medicaid Other | Source: Ambulatory Visit | Attending: Obstetrics & Gynecology | Admitting: Obstetrics & Gynecology

## 2021-02-10 VITALS — BP 119/75 | HR 79 | Ht 62.0 in | Wt 146.0 lb

## 2021-02-10 DIAGNOSIS — Z30431 Encounter for routine checking of intrauterine contraceptive device: Secondary | ICD-10-CM

## 2021-02-10 DIAGNOSIS — Z8619 Personal history of other infectious and parasitic diseases: Secondary | ICD-10-CM | POA: Diagnosis not present

## 2021-02-10 DIAGNOSIS — Z3009 Encounter for other general counseling and advice on contraception: Secondary | ICD-10-CM

## 2021-02-10 DIAGNOSIS — Z01419 Encounter for gynecological examination (general) (routine) without abnormal findings: Secondary | ICD-10-CM

## 2021-02-10 MED ORDER — VALACYCLOVIR HCL 1 G PO TABS: 1000.0000 mg | ORAL_TABLET | Freq: Every day | ORAL | 4 refills | Status: DC

## 2021-02-10 NOTE — Patient Instructions (Signed)
Laparoscopic Tubal Ligation Laparoscopic tubal ligation is a procedure to close the fallopian tubes. This is done to prevent pregnancy. When the fallopian tubes are closed, the eggs that your ovaries release cannot enter the uterus, and sperm cannot reach the released eggs. You should not have this procedure if you want to get pregnant someday or if you are unsure about having more children. Tell a health care provider about: Any allergies you have. All medicines you are taking, including vitamins, herbs, eye drops, creams, and over-the-counter medicines. Any problems you or family members have had with anesthetic medicines. Any blood disorders you have. Any surgeries you have had. Any medical conditions you have. Whether you are pregnant or may be pregnant. Any past pregnancies. What are the risks? Generally, this is a safe procedure. However, problems may occur, including: Infection. Bleeding. Injury to other organs in the abdomen. Side effects from anesthetic medicines. Failure of the procedure. This procedure can increase your risk of an ectopic pregnancy. This is a pregnancy in which a fertilized egg attaches to the outside of the uterus. What happens before the procedure? Staying hydrated Follow instructions from your health care provider about hydration, which may include: Up to 2 hours before the procedure - you may continue to drink clear liquids, such as water, clear fruit juice, black coffee, and plain tea. Eating and drinking restrictions Follow instructions from your health care provider about eating and drinking, which may include: 8 hours before the procedure - stop eating heavy meals or foods, such as meat, fried foods, or fatty foods. 6 hours before the procedure - stop eating light meals or foods, such as toast or cereal. 6 hours before the procedure - stop drinking milk or drinks that contain milk. 2 hours before the procedure - stop drinking clear  liquids. Medicines Ask your health care provider about: Changing or stopping your regular medicines. This is especially important if you are taking diabetes medicines or blood thinners. Taking medicines such as aspirin and ibuprofen. These medicines can thin your blood. Do not take these medicines unless your health care provider tells you to take them. Taking over-the-counter medicines, vitamins, herbs, and supplements. Surgery safety Ask your health care provider: How your surgery site will be marked. What steps will be taken to help prevent infection. These steps may include: Removing hair at the surgery site. Washing skin with a germ-killing soap. Taking antibiotic medicine. General instructions Do not use any products that contain nicotine or tobacco for at least 4 weeks before the procedure. These products include cigarettes, chewing tobacco, and vaping devices, such as e-cigarettes. If you need help quitting, ask your health care provider. Plan to have someone take you home from the hospital. If you will be going home right after the procedure, plan to have a responsible adult care for you for the time you are told. This is important. What happens during the procedure?   An IV will be inserted into one of your veins. You will be given one or more of the following: A medicine to help you relax (sedative). A medicine to numb the area (local anesthetic). A medicine to make you fall asleep (general anesthetic). A medicine that is injected into an area of your body to numb everything below the injection site (regional anesthetic). Your bladder may be emptied with a small tube (catheter). If you have been given a general anesthetic, a tube will be put down your throat to help you breathe. Two small incisions will be made  in your lower abdomen and near your belly button. Your abdomen will be inflated with a gas. This will let the surgeon see better and will give the surgeon room to  work. A lighted tube with camera (laparoscope) will be inserted into your abdomen through one of the incisions. Small instruments will be inserted through the other incision. The fallopian tubes will be tied off, burned (cauterized), or blocked with a clip, ring, or clamp. A small portion in the center of each fallopian tube may be removed. The gas will be released from the abdomen. The incisions will be closed with stitches (sutures). A bandage (dressing) will be placed over the incisions. The procedure may vary among health care providers and hospitals. What happens after the procedure? Your blood pressure, heart rate, breathing rate, and blood oxygen level will be monitored until you leave the hospital. You will be given medicine to help with pain, nausea, and vomiting as needed. You may have vaginal discharge after the procedure. You may need to wear a sanitary napkin. If you were given a sedative during the procedure, it can affect you for several hours. Do not drive or operate machinery until your health care provider says that it is safe. Summary Laparoscopic tubal ligation is a procedure that is done to prevent pregnancy. You should not have this procedure if you want to get pregnant someday or if you are unsure about having more children. The procedure is done using a thin, lighted tube (laparoscope) with a camera attached that will be inserted into your abdomen through an incision. After the procedure you will be given medicine to help with pain, nausea, and vomiting as needed. Plan to have someone take you home from the hospital. This information is not intended to replace advice given to you by your health care provider. Make sure you discuss any questions you have with your health care provider. Document Revised: 01/03/2020 Document Reviewed: 01/03/2020 Elsevier Patient Education  2022 [employer] Inc. Laparoscopic Tubal Ligation, Care After The following information offers guidance  on how to care for yourself after your procedure. Your health care provider may also give you more specific instructions. If you have problems or questions, contact your health care provider. What can I expect after the procedure? After the procedure, it is common to have: A sore throat if general anesthesia was used. Pain in shoulders, back, and abdomen. This is caused by the gas that was used during the procedure. Mild discomfort or cramping in your abdomen. Pain or soreness in the area where the surgical incision was made. A bloated feeling. Tiredness. Nausea and vomiting. Follow these instructions at home: Medicines Take over-the-counter and prescription medicines only as told by your health care provider. Ask your health care provider if the medicine prescribed to you: Requires you to avoid driving or using heavy machinery. Can cause constipation. You may need to take these actions to prevent or treat constipation: Drink enough fluid to keep your urine pale yellow. Take over-the-counter or prescription medicines. Eat foods that are high in fiber, such as beans, whole grains, and fresh fruits and vegetables. Limit foods that are high in fat and processed sugars, such as fried or sweet foods. Do not take aspirin because it can cause bleeding. Incision care   Follow instructions from your health care provider about how to take care of your incision. Make sure you: Wash your hands with soap and water for at least 20 seconds before and after you change your bandage (dressing). If  soap and water are not available, use hand sanitizer. Change your dressing as told by your health care provider. Leave stitches (sutures), skin glue, or adhesive strips in place. These skin closures may need to stay in place for 2 weeks or longer. If adhesive strip edges start to loosen and curl up, you may trim the loose edges. Do not remove adhesive strips completely unless your health care provider tells you to  do that. Check your incision area every day for signs of infection. Check for: Redness, swelling, or more pain. Fluid or blood. Warmth. Pus or a bad smell. Activity Rest as told by your health care provider. Avoid sitting for a long time without moving. Get up to take short walks every 1-2 hours. This is important to improve blood flow and breathing. Ask for help if you feel weak or unsteady. Do not have sex, douche, or put a tampon or anything else in your vagina for 6 weeks or as long as told by your health care provider. Do not lift anything that is heavier than your baby for 2 weeks, or the limit that you are told, until your health care provider says that it is safe. Do not take baths, swim, or use a hot tub until your health care provider approves. Ask your health care provider if you may take showers. You may only be allowed to take sponge baths. Return to your normal activities as told by your health care provider. Ask your health care provider what activities are safe for you. General instructions After the procedure you may need to wear a sanitary pad for vaginal discharge. Have someone help you with your daily household tasks for the first few days. Keep all follow-up visits. This is important. Contact a health care provider if: You have redness, swelling, or more pain around your incision. Your incision feels warm to the touch. You have pus or a bad smell coming from your incision. The edges of your incision break open after the sutures have been removed. Your pain does not improve after 2-3 days. You have a rash. You repeatedly become dizzy or light-headed. Your pain medicine is not helping. Get help right away if: You have a fever or chills. You faint. You have increasing pain in your abdomen. You have severe pain in one or both of your shoulders. You have fluid or blood coming from your sutures or heavy bleeding from your vagina. You have shortness of breath or  difficulty breathing. You have chest pain, leg pain, or leg swelling. You have ongoing nausea, vomiting, or diarrhea. These symptoms may represent a serious problem that is an emergency. Do not wait to see if the symptoms will go away. Get medical help right away. Call your local emergency services (911 in the [place] Do not drive yourself to the hospital. Summary After the procedure, it is common to have mild discomfort or cramping in your abdomen. After the procedure you may need to wear a sanitary pad for vaginal discharge. Take over-the-counter and prescription medicines only as told by your health care provider. Watch for symptoms that should prompt you to call your health care provider. Keep all follow-up visits. This is important. This information is not intended to replace advice given to you by your health care provider. Make sure you discuss any questions you have with your health care provider. Document Revised: 01/03/2020 Document Reviewed: 01/03/2020 Elsevier Patient Education  2022 [employer].

## 2021-02-10 NOTE — Progress Notes (Signed)
Subjective:     Kelly Rush is a 35 y.o. female here for a routine exam.  Current complaints: pt reports that she does not want any more pregnancies.  She is concerned that the LnIUD may be affecting her mood. She has discussed BTL and vasectomy with her spouse. She wants her IUD out but she is interested in discussing timing. Pt also reports pain in her RLQ. She is not sure if it is related to the IUD.  Pt reports that she has had issues with her mood since the delivery of her son 2 years prev.   She is wondering if the LnIUD is the cause of her sx. Pts spouse has considered a vasectomy.   Pt has a h/o genital HSV. Her last outbreak was in Aug. She has only rare outbreaks but, requests refills of meds when she does have sx.     Gynecologic History Patient's last menstrual period was 02/02/2021. Contraception: IUD Last Pap: 07/23/2018. Results were: normal Last mammogram: n/a.   Obstetric History OB History  Gravida Para Term Preterm AB Living  3 3 3     3   SAB IAB Ectopic Multiple Live Births        0 3    # Outcome Date GA Lbr Len/2nd Weight Sex Delivery Anes PTL Lv  3 Term 02/01/19 [redacted]w[redacted]d 08:56 / 02:12 6 lb 5.1 oz (2.866 kg) M Vag-Spont EPI  LIV  2 Term 06/03/14 [redacted]w[redacted]d 07:01 / 01:00 7 lb 2.8 oz (3.255 kg) F Vag-Spont EPI  LIV  1 Term 02/06/08 [redacted]w[redacted]d   M Vag-Spont   LIV    The following portions of the patient's history were reviewed and updated as appropriate: allergies, current medications, past family history, past medical history, past social history, past surgical history, and problem list.  Review of Systems Pertinent items are noted in HPI.    Objective:  BP 119/75   Pulse 79   Ht 5\' 2"  (1.575 m)   Wt 146 lb (66.2 kg)   LMP 02/02/2021   Breastfeeding Yes   BMI 26.70 kg/m  General Appearance:    Alert, cooperative, no distress, appears stated age  Head:    Normocephalic, without obvious abnormality, atraumatic  Eyes:    conjunctiva/corneas clear, EOM's intact, both  eyes  Ears:    Normal external ear canals, both ears  Nose:   Nares normal, septum midline, mucosa normal, no drainage    or sinus tenderness  Throat:   Lips, mucosa, and tongue normal; teeth and gums normal  Neck:   Supple, symmetrical, trachea midline, no adenopathy;    thyroid:  no enlargement/tenderness/nodules  Back:     Symmetric, no curvature, ROM normal, no CVA tenderness  Lungs:     respirations unlabored  Chest Wall:    No tenderness or deformity   Heart:    Regular rate and rhythm  Breast Exam:    No tenderness, masses, or nipple abnormality  Abdomen:     Soft, non-tender, bowel sounds active all four quadrants,    no masses, no organomegaly  Genitalia:    Normal female without lesion, discharge or tenderness   IUD strings noted. No uterine tenderness. No adnexal masses. No CMT.   Extremities:   Extremities normal, atraumatic, no cyanosis or edema  Pulses:   2+ and symmetric all extremities  Skin:   Skin color, texture, turgor normal, no rashes or lesions      Assessment:    Healthy female exam.  Contraception  counseling. Reviewed with pt that once the IUD is removed she is not protected. Offered her removal at the time of a sterilization of that is what she opts for. Reviewed other forms of birth control options available including over the counter/barrier methods; hormonal contraceptive medication including pill, patch, ring, injection,contraceptive implant; hormonal and permanent sterilization options including vasectomy and the various tubal sterilization modalities. Reviewed how those options might affect her moods. Risks and benefits reviewed.  Questions were answered.  Information was given to patient to review.    Plan:  Diagnoses and all orders for this visit:  Well female exam with routine gynecological exam -     Cytology - PAP( [place])  IUD check up  Encounter for counseling regarding contraception  History of herpes genitalis -     valACYclovir  (VALTREX) 1000 MG tablet; Take 1 tablet (1,000 mg total) by mouth daily. Take for 5 days   Title [place] papers signed. I have reviewed the risks and after care for a lap BTL. Pt will discuss with her husband and send me a note if she elects for a lap salpingectomy.   Deklin Bieler L. Harraway-Smith, M.D., [place] [facility]

## 2021-02-11 LAB — CYTOLOGY - PAP
Chlamydia: NEGATIVE
Comment: NEGATIVE
Comment: NEGATIVE
Comment: NORMAL
Diagnosis: NEGATIVE
High risk HPV: NEGATIVE
Neisseria Gonorrhea: NEGATIVE

## 2021-04-12 ENCOUNTER — Telehealth: Payer: Self-pay

## 2021-04-12 MED ORDER — FLUCONAZOLE 150 MG PO TABS: 150.0000 mg | ORAL_TABLET | Freq: Once | ORAL | 0 refills | Status: AC

## 2021-04-12 NOTE — Telephone Encounter (Signed)
-----   Message from Marti Sleigh, [place] sent at 04/12/2021  2:01 PM EST ----- Regarding: Rx Request Would like Rx for yeast.  [id number] [place], [place] - [address] AT [facility] [address]  [address], [place] [address]  Phone:  [phone] Fax:  [phone]

## 2021-04-12 NOTE — Telephone Encounter (Signed)
Patient called stating she has signs and symptoms of yeast infection. Patient states she has white discharge with itching. Confirmed pharmacy and rx sent per protocol. Armandina Stammer RN

## 2021-06-16 ENCOUNTER — Other Ambulatory Visit: Payer: Self-pay

## 2021-06-16 ENCOUNTER — Other Ambulatory Visit (HOSPITAL_COMMUNITY)
Admission: RE | Admit: 2021-06-16 | Discharge: 2021-06-16 | Disposition: A | Discharge status: Home / Self Care | Payer: Medicaid Other | Source: Ambulatory Visit | Attending: Obstetrics & Gynecology | Admitting: Obstetrics & Gynecology

## 2021-06-16 ENCOUNTER — Ambulatory Visit (INDEPENDENT_AMBULATORY_CARE_PROVIDER_SITE_OTHER): Payer: Medicaid Other

## 2021-06-16 VITALS — BP 123/78 | HR 75 | Wt 141.0 lb

## 2021-06-16 DIAGNOSIS — N898 Other specified noninflammatory disorders of vagina: Secondary | ICD-10-CM

## 2021-06-16 MED ORDER — METRONIDAZOLE 500 MG PO TABS: 500.0000 mg | ORAL_TABLET | Freq: Two times a day (BID) | ORAL | 0 refills | Status: DC

## 2021-06-16 NOTE — Progress Notes (Addendum)
°  SUBJECTIVE:  36 y.o. female complains of clear vaginal discharge for 1  week(s). Denies abnormal vaginal bleeding or significant pelvic pain or fever. No UTI symptoms. Denies history of known exposure to STD. Patient does not want STD testing today.   Patient's last menstrual period was 06/16/2021 (exact date).  OBJECTIVE:  She appears well.  ASSESSMENT:  Vaginal Discharge  Vaginal Odor   PLAN:   BVAG, CVAG probe sent to lab. Treatment: Patient would like to go ahead an be treated since she has been dealing with this for one week.  ROV prn if symptoms persist or worsen.   Armandina Stammer RN   Attestation of Attending Supervision of RN: Evaluation and management procedures were performed by the nurse under my supervision and collaboration.  I have reviewed the nursing note and chart, and I agree with the management and plan.  Carolyn L. Harraway-Smith, M.D., [place]

## 2021-06-18 LAB — CERVICOVAGINAL ANCILLARY ONLY
Bacterial Vaginitis (gardnerella): POSITIVE — AB
Candida Glabrata: NEGATIVE
Candida Vaginitis: NEGATIVE
Comment: NEGATIVE
Comment: NEGATIVE
Comment: NEGATIVE

## 2021-06-21 ENCOUNTER — Other Ambulatory Visit: Payer: Self-pay | Admitting: Obstetrics & Gynecology

## 2022-01-28 ENCOUNTER — Telehealth: Payer: Self-pay

## 2022-01-28 DIAGNOSIS — B379 Candidiasis, unspecified: Secondary | ICD-10-CM

## 2022-01-28 MED ORDER — FLUCONAZOLE 150 MG PO TABS: ORAL_TABLET | ORAL | 1 refills | Status: DC

## 2022-01-28 NOTE — Telephone Encounter (Signed)
Patient called requesting a Rx for a yeast infection. Patient states she has been having some vaginal itching. Diflucan 150 mg tablet PO once was sent to her pharmacy.  Sevin Langenbach l Kimon Loewen, CMA

## 2022-02-09 DIAGNOSIS — B353 Tinea pedis: Secondary | ICD-10-CM | POA: Diagnosis not present

## 2022-02-09 DIAGNOSIS — L600 Ingrowing nail: Secondary | ICD-10-CM | POA: Diagnosis not present

## 2022-02-17 DIAGNOSIS — Z20822 Contact with and (suspected) exposure to covid-19: Secondary | ICD-10-CM | POA: Diagnosis not present

## 2022-02-17 DIAGNOSIS — F418 Other specified anxiety disorders: Secondary | ICD-10-CM | POA: Diagnosis not present

## 2022-02-17 DIAGNOSIS — I10 Essential (primary) hypertension: Secondary | ICD-10-CM | POA: Diagnosis not present

## 2022-02-17 DIAGNOSIS — Z0000 Encounter for general adult medical examination without abnormal findings: Secondary | ICD-10-CM | POA: Diagnosis not present

## 2022-02-17 DIAGNOSIS — Z8759 Personal history of other complications of pregnancy, childbirth and the puerperium: Secondary | ICD-10-CM | POA: Diagnosis not present

## 2022-02-17 DIAGNOSIS — E7800 Pure hypercholesterolemia, unspecified: Secondary | ICD-10-CM | POA: Diagnosis not present

## 2022-02-17 DIAGNOSIS — R0602 Shortness of breath: Secondary | ICD-10-CM | POA: Diagnosis not present

## 2022-02-17 DIAGNOSIS — Z6826 Body mass index (BMI) 26.0-26.9, adult: Secondary | ICD-10-CM | POA: Diagnosis not present

## 2022-02-17 DIAGNOSIS — M797 Fibromyalgia: Secondary | ICD-10-CM | POA: Diagnosis not present

## 2022-02-17 DIAGNOSIS — Z975 Presence of (intrauterine) contraceptive device: Secondary | ICD-10-CM | POA: Diagnosis not present

## 2022-02-17 DIAGNOSIS — K219 Gastro-esophageal reflux disease without esophagitis: Secondary | ICD-10-CM | POA: Diagnosis not present

## 2022-02-17 DIAGNOSIS — Z79899 Other long term (current) drug therapy: Secondary | ICD-10-CM | POA: Diagnosis not present

## 2022-02-17 DIAGNOSIS — Z23 Encounter for immunization: Secondary | ICD-10-CM | POA: Diagnosis not present

## 2022-03-02 ENCOUNTER — Ambulatory Visit: Payer: Medicaid Other | Admitting: Obstetrics & Gynecology

## 2022-03-08 DIAGNOSIS — G43709 Chronic migraine without aura, not intractable, without status migrainosus: Secondary | ICD-10-CM | POA: Diagnosis not present

## 2022-03-16 DIAGNOSIS — M797 Fibromyalgia: Secondary | ICD-10-CM | POA: Diagnosis not present

## 2022-03-16 DIAGNOSIS — Z8759 Personal history of other complications of pregnancy, childbirth and the puerperium: Secondary | ICD-10-CM | POA: Diagnosis not present

## 2022-03-16 DIAGNOSIS — K219 Gastro-esophageal reflux disease without esophagitis: Secondary | ICD-10-CM | POA: Diagnosis not present

## 2022-03-16 DIAGNOSIS — R053 Chronic cough: Secondary | ICD-10-CM | POA: Diagnosis not present

## 2022-03-16 DIAGNOSIS — J302 Other seasonal allergic rhinitis: Secondary | ICD-10-CM | POA: Diagnosis not present

## 2022-03-16 DIAGNOSIS — F418 Other specified anxiety disorders: Secondary | ICD-10-CM | POA: Diagnosis not present

## 2022-03-16 DIAGNOSIS — G43009 Migraine without aura, not intractable, without status migrainosus: Secondary | ICD-10-CM | POA: Diagnosis not present

## 2022-03-16 DIAGNOSIS — I10 Essential (primary) hypertension: Secondary | ICD-10-CM | POA: Diagnosis not present

## 2022-03-16 DIAGNOSIS — L700 Acne vulgaris: Secondary | ICD-10-CM | POA: Diagnosis not present

## 2022-03-16 DIAGNOSIS — E7800 Pure hypercholesterolemia, unspecified: Secondary | ICD-10-CM | POA: Diagnosis not present

## 2022-03-16 DIAGNOSIS — Z6827 Body mass index (BMI) 27.0-27.9, adult: Secondary | ICD-10-CM | POA: Diagnosis not present

## 2022-03-16 DIAGNOSIS — U099 Post covid-19 condition, unspecified: Secondary | ICD-10-CM | POA: Diagnosis not present

## 2022-03-22 ENCOUNTER — Other Ambulatory Visit: Payer: Self-pay

## 2022-03-22 DIAGNOSIS — Z8619 Personal history of other infectious and parasitic diseases: Secondary | ICD-10-CM

## 2022-03-22 MED ORDER — VALACYCLOVIR HCL 1 G PO TABS: 1000.0000 mg | ORAL_TABLET | Freq: Every day | ORAL | 4 refills | Status: DC

## 2022-04-27 ENCOUNTER — Ambulatory Visit: Payer: Medicaid Other | Admitting: Obstetrics & Gynecology

## 2022-05-06 ENCOUNTER — Other Ambulatory Visit: Payer: Self-pay

## 2022-05-06 MED ORDER — METRONIDAZOLE 500 MG PO TABS: 500.0000 mg | ORAL_TABLET | Freq: Two times a day (BID) | ORAL | 0 refills | Status: DC

## 2022-05-30 ENCOUNTER — Ambulatory Visit: Payer: Medicaid Other | Admitting: Obstetrics and Gynecology

## 2022-06-02 NOTE — Progress Notes (Signed)
ANNUAL EXAM Patient name: Kelly Rush MRN [id number]  Date of birth: [birth year] Chief Complaint:   Gynecologic Exam  History of Present Illness:   Kelly Rush is a 37 y.o. G33P3003 female being seen today for a routine annual exam.   Current complaints: Intermittent BV - usually a few times a year. Usually flagyl PO helps.   Sometimes has random pains - thought maybe ovarian cysts Takes valtrex for outbreak prns Accepts STI testing but no concern No issues on IUD  Patient's last menstrual period was 05/23/2022 (approximate).  Current birth control: ppIUD done 02/01/2019   Last pap: 02/10/2021: pap/hpv wnl.   Health Maintenance Due  Topic Date Due   COVID-19 Vaccine (1) Never done   Hepatitis C Screening  Never done   INFLUENZA VACCINE  11/30/2021    Review of Systems:   Pertinent items are noted in HPI Denies any headaches, blurred vision, fatigue, shortness of breath, chest pain, abdominal pain, problems with periods, bowel movements, urination, or intercourse unless otherwise stated above.  Pertinent History Reviewed:  Reviewed past medical,surgical, social and family history.  Reviewed problem list, medications and allergies. Physical Assessment:   Vitals:   06/06/22 1528  BP: 133/81  Pulse: 94  Weight: 147 lb (66.7 kg)  Height: 5\' 3"  (1.6 m)  Body mass index is 26.04 kg/m.   Physical Examination:  General appearance - well appearing, and in no distress Mental status - alert, oriented to person, place, and time Psych:  She has a normal mood and affect Skin - warm and dry, normal color, no suspicious lesions noted Chest - effort normal Heart - normal rate  Breasts - breasts appear normal, no suspicious masses, no skin or nipple changes or axillary nodes Abdomen - soft, nontender, nondistended, no masses or organomegaly Pelvic -  VULVA: normal appearing vulva with no masses, tenderness or lesions  VAGINA: normal appearing vagina with normal color and  discharge, no lesions  CERVIX: normal appearing cervix without discharge or lesions, no CMT UTERUS: uterus is felt to be normal size, shape, consistency and nontender  ADNEXA: No adnexal masses or tenderness noted. Extremities:  No swelling or varicosities noted  Chaperone present for exam  No results found for this or any previous visit (from the past 24 hour(s)).  Assessment & Plan:  Kelly Rush was seen today for gynecologic exam.  Diagnoses and all orders for this visit:  HSV (herpes simplex virus) infection -     valACYclovir (VALTREX) 1000 MG tablet; Take 1 tablet (1,000 mg total) by mouth daily. Take for 5 days  BV (bacterial vaginosis) Reviewed measures available for suppression but at this point likely doesn't have BV often enough to warrant suppression (reviewed would typically hold off unless closer to 6 times per year). Discussed option for boric acid but reviewed risks if PO ingestion and need to lock it to keep safe from children.  -     metroNIDAZOLE (FLAGYL) 500 MG tablet; Take 1 tablet (500 mg total) by mouth 2 (two) times daily. -     Cervicovaginal ancillary only( [place])  Encounter for annual routine gynecological examination - Cervical cancer screening: Discussed guidelines. Pap with HPV wnl 04/2021 - GC/CT: accepts - Birth Control: IUD - placed 2020. Reviewed removal 2028.  - Breast Health: Encouraged self breast awareness/SBE. Teaching provided.  - F/U 12 months and prn     No orders of the defined types were placed in this encounter.   Meds:  Meds ordered this encounter  Medications   valACYclovir (VALTREX) 1000 MG tablet    Sig: Take 1 tablet (1,000 mg total) by mouth daily. Take for 5 days    Dispense:  5 tablet    Refill:  6   metroNIDAZOLE (FLAGYL) 500 MG tablet    Sig: Take 1 tablet (500 mg total) by mouth 2 (two) times daily.    Dispense:  14 tablet    Refill:  3    Follow-up: No follow-ups on file.  Radene Gunning, MD 06/06/2022 8:46  PM

## 2022-06-06 ENCOUNTER — Encounter: Payer: Self-pay | Admitting: Obstetrics and Gynecology

## 2022-06-06 ENCOUNTER — Other Ambulatory Visit (HOSPITAL_COMMUNITY)
Admission: RE | Admit: 2022-06-06 | Discharge: 2022-06-06 | Disposition: A | Discharge status: Home / Self Care | Payer: Medicaid Other | Source: Ambulatory Visit | Attending: Obstetrics & Gynecology | Admitting: Obstetrics & Gynecology

## 2022-06-06 ENCOUNTER — Ambulatory Visit (INDEPENDENT_AMBULATORY_CARE_PROVIDER_SITE_OTHER): Payer: Medicaid Other | Admitting: Obstetrics and Gynecology

## 2022-06-06 VITALS — BP 133/81 | HR 94 | Ht 63.0 in | Wt 147.0 lb

## 2022-06-06 DIAGNOSIS — N760 Acute vaginitis: Secondary | ICD-10-CM | POA: Insufficient documentation

## 2022-06-06 DIAGNOSIS — B9689 Other specified bacterial agents as the cause of diseases classified elsewhere: Secondary | ICD-10-CM | POA: Insufficient documentation

## 2022-06-06 DIAGNOSIS — Z01419 Encounter for gynecological examination (general) (routine) without abnormal findings: Secondary | ICD-10-CM | POA: Diagnosis not present

## 2022-06-06 DIAGNOSIS — B009 Herpesviral infection, unspecified: Secondary | ICD-10-CM

## 2022-06-06 MED ORDER — VALACYCLOVIR HCL 1 G PO TABS: 1000.0000 mg | ORAL_TABLET | Freq: Every day | ORAL | 6 refills | Status: DC

## 2022-06-06 MED ORDER — METRONIDAZOLE 500 MG PO TABS: 500.0000 mg | ORAL_TABLET | Freq: Two times a day (BID) | ORAL | 3 refills | Status: DC

## 2022-06-08 ENCOUNTER — Encounter: Payer: Self-pay | Admitting: Obstetrics and Gynecology

## 2022-06-08 LAB — CERVICOVAGINAL ANCILLARY ONLY
Bacterial Vaginitis (gardnerella): POSITIVE — AB
Candida Glabrata: NEGATIVE
Candida Vaginitis: NEGATIVE
Chlamydia: NEGATIVE
Comment: NEGATIVE
Comment: NEGATIVE
Comment: NEGATIVE
Comment: NEGATIVE
Comment: NEGATIVE
Comment: NORMAL
Neisseria Gonorrhea: NEGATIVE
Trichomonas: NEGATIVE

## 2022-06-21 DIAGNOSIS — G43E11 Chronic migraine with aura, intractable, with status migrainosus: Secondary | ICD-10-CM | POA: Diagnosis not present

## 2022-06-22 DIAGNOSIS — G43909 Migraine, unspecified, not intractable, without status migrainosus: Secondary | ICD-10-CM | POA: Diagnosis not present

## 2022-06-22 DIAGNOSIS — Z6827 Body mass index (BMI) 27.0-27.9, adult: Secondary | ICD-10-CM | POA: Diagnosis not present

## 2022-06-22 DIAGNOSIS — I10 Essential (primary) hypertension: Secondary | ICD-10-CM | POA: Diagnosis not present

## 2022-06-24 DIAGNOSIS — Z6827 Body mass index (BMI) 27.0-27.9, adult: Secondary | ICD-10-CM | POA: Diagnosis not present

## 2022-06-24 DIAGNOSIS — E7800 Pure hypercholesterolemia, unspecified: Secondary | ICD-10-CM | POA: Diagnosis not present

## 2022-06-24 DIAGNOSIS — G43009 Migraine without aura, not intractable, without status migrainosus: Secondary | ICD-10-CM | POA: Diagnosis not present

## 2022-06-24 DIAGNOSIS — I10 Essential (primary) hypertension: Secondary | ICD-10-CM | POA: Diagnosis not present

## 2022-06-24 DIAGNOSIS — Z975 Presence of (intrauterine) contraceptive device: Secondary | ICD-10-CM | POA: Diagnosis not present

## 2022-06-24 DIAGNOSIS — M797 Fibromyalgia: Secondary | ICD-10-CM | POA: Diagnosis not present

## 2022-06-29 ENCOUNTER — Other Ambulatory Visit: Payer: Self-pay | Admitting: Obstetrics and Gynecology

## 2022-06-29 DIAGNOSIS — B9689 Other specified bacterial agents as the cause of diseases classified elsewhere: Secondary | ICD-10-CM

## 2022-08-15 DIAGNOSIS — M797 Fibromyalgia: Secondary | ICD-10-CM | POA: Diagnosis not present

## 2022-08-15 DIAGNOSIS — G43009 Migraine without aura, not intractable, without status migrainosus: Secondary | ICD-10-CM | POA: Diagnosis not present

## 2022-08-15 DIAGNOSIS — F909 Attention-deficit hyperactivity disorder, unspecified type: Secondary | ICD-10-CM | POA: Diagnosis not present

## 2022-08-15 DIAGNOSIS — Z79899 Other long term (current) drug therapy: Secondary | ICD-10-CM | POA: Diagnosis not present

## 2022-08-15 DIAGNOSIS — E7800 Pure hypercholesterolemia, unspecified: Secondary | ICD-10-CM | POA: Diagnosis not present

## 2022-08-15 DIAGNOSIS — Z975 Presence of (intrauterine) contraceptive device: Secondary | ICD-10-CM | POA: Diagnosis not present

## 2022-08-15 DIAGNOSIS — Z6827 Body mass index (BMI) 27.0-27.9, adult: Secondary | ICD-10-CM | POA: Diagnosis not present

## 2022-08-15 DIAGNOSIS — I10 Essential (primary) hypertension: Secondary | ICD-10-CM | POA: Diagnosis not present

## 2022-08-16 DIAGNOSIS — F4323 Adjustment disorder with mixed anxiety and depressed mood: Secondary | ICD-10-CM | POA: Diagnosis not present

## 2022-08-23 DIAGNOSIS — F4323 Adjustment disorder with mixed anxiety and depressed mood: Secondary | ICD-10-CM | POA: Diagnosis not present

## 2022-08-30 DIAGNOSIS — F4323 Adjustment disorder with mixed anxiety and depressed mood: Secondary | ICD-10-CM | POA: Diagnosis not present

## 2022-09-03 DIAGNOSIS — J0191 Acute recurrent sinusitis, unspecified: Secondary | ICD-10-CM | POA: Diagnosis not present

## 2022-09-03 DIAGNOSIS — Z6826 Body mass index (BMI) 26.0-26.9, adult: Secondary | ICD-10-CM | POA: Diagnosis not present

## 2022-09-06 DIAGNOSIS — F4323 Adjustment disorder with mixed anxiety and depressed mood: Secondary | ICD-10-CM | POA: Diagnosis not present

## 2022-09-13 DIAGNOSIS — F4323 Adjustment disorder with mixed anxiety and depressed mood: Secondary | ICD-10-CM | POA: Diagnosis not present

## 2022-09-14 DIAGNOSIS — Z975 Presence of (intrauterine) contraceptive device: Secondary | ICD-10-CM | POA: Diagnosis not present

## 2022-09-14 DIAGNOSIS — G43009 Migraine without aura, not intractable, without status migrainosus: Secondary | ICD-10-CM | POA: Diagnosis not present

## 2022-09-14 DIAGNOSIS — Z79899 Other long term (current) drug therapy: Secondary | ICD-10-CM | POA: Diagnosis not present

## 2022-09-14 DIAGNOSIS — M797 Fibromyalgia: Secondary | ICD-10-CM | POA: Diagnosis not present

## 2022-09-14 DIAGNOSIS — F909 Attention-deficit hyperactivity disorder, unspecified type: Secondary | ICD-10-CM | POA: Diagnosis not present

## 2022-09-14 DIAGNOSIS — Z6826 Body mass index (BMI) 26.0-26.9, adult: Secondary | ICD-10-CM | POA: Diagnosis not present

## 2022-09-14 DIAGNOSIS — F418 Other specified anxiety disorders: Secondary | ICD-10-CM | POA: Diagnosis not present

## 2022-09-14 DIAGNOSIS — J302 Other seasonal allergic rhinitis: Secondary | ICD-10-CM | POA: Diagnosis not present

## 2022-09-14 DIAGNOSIS — E7800 Pure hypercholesterolemia, unspecified: Secondary | ICD-10-CM | POA: Diagnosis not present

## 2022-09-14 DIAGNOSIS — I10 Essential (primary) hypertension: Secondary | ICD-10-CM | POA: Diagnosis not present

## 2022-09-20 DIAGNOSIS — F4323 Adjustment disorder with mixed anxiety and depressed mood: Secondary | ICD-10-CM | POA: Diagnosis not present

## 2022-09-27 DIAGNOSIS — F4323 Adjustment disorder with mixed anxiety and depressed mood: Secondary | ICD-10-CM | POA: Diagnosis not present

## 2022-09-29 DIAGNOSIS — F4323 Adjustment disorder with mixed anxiety and depressed mood: Secondary | ICD-10-CM | POA: Diagnosis not present

## 2022-10-04 DIAGNOSIS — F4323 Adjustment disorder with mixed anxiety and depressed mood: Secondary | ICD-10-CM | POA: Diagnosis not present

## 2022-10-10 DIAGNOSIS — L600 Ingrowing nail: Secondary | ICD-10-CM | POA: Diagnosis not present

## 2022-10-11 DIAGNOSIS — F4323 Adjustment disorder with mixed anxiety and depressed mood: Secondary | ICD-10-CM | POA: Diagnosis not present

## 2022-10-18 DIAGNOSIS — F4323 Adjustment disorder with mixed anxiety and depressed mood: Secondary | ICD-10-CM | POA: Diagnosis not present

## 2022-10-25 DIAGNOSIS — F4323 Adjustment disorder with mixed anxiety and depressed mood: Secondary | ICD-10-CM | POA: Diagnosis not present

## 2022-11-01 DIAGNOSIS — F4323 Adjustment disorder with mixed anxiety and depressed mood: Secondary | ICD-10-CM | POA: Diagnosis not present

## 2022-11-08 DIAGNOSIS — F4323 Adjustment disorder with mixed anxiety and depressed mood: Secondary | ICD-10-CM | POA: Diagnosis not present

## 2022-11-10 DIAGNOSIS — F4323 Adjustment disorder with mixed anxiety and depressed mood: Secondary | ICD-10-CM | POA: Diagnosis not present

## 2022-11-15 DIAGNOSIS — F4323 Adjustment disorder with mixed anxiety and depressed mood: Secondary | ICD-10-CM | POA: Diagnosis not present

## 2022-11-17 DIAGNOSIS — F4323 Adjustment disorder with mixed anxiety and depressed mood: Secondary | ICD-10-CM | POA: Diagnosis not present

## 2022-11-22 DIAGNOSIS — F4323 Adjustment disorder with mixed anxiety and depressed mood: Secondary | ICD-10-CM | POA: Diagnosis not present

## 2022-12-01 DIAGNOSIS — F4323 Adjustment disorder with mixed anxiety and depressed mood: Secondary | ICD-10-CM | POA: Diagnosis not present

## 2022-12-02 DIAGNOSIS — E7800 Pure hypercholesterolemia, unspecified: Secondary | ICD-10-CM | POA: Diagnosis not present

## 2022-12-02 DIAGNOSIS — I10 Essential (primary) hypertension: Secondary | ICD-10-CM | POA: Diagnosis not present

## 2022-12-02 DIAGNOSIS — Z6826 Body mass index (BMI) 26.0-26.9, adult: Secondary | ICD-10-CM | POA: Diagnosis not present

## 2022-12-02 DIAGNOSIS — Z975 Presence of (intrauterine) contraceptive device: Secondary | ICD-10-CM | POA: Diagnosis not present

## 2022-12-02 DIAGNOSIS — F909 Attention-deficit hyperactivity disorder, unspecified type: Secondary | ICD-10-CM | POA: Diagnosis not present

## 2022-12-02 DIAGNOSIS — M797 Fibromyalgia: Secondary | ICD-10-CM | POA: Diagnosis not present

## 2022-12-02 DIAGNOSIS — G43009 Migraine without aura, not intractable, without status migrainosus: Secondary | ICD-10-CM | POA: Diagnosis not present

## 2022-12-02 DIAGNOSIS — Z79899 Other long term (current) drug therapy: Secondary | ICD-10-CM | POA: Diagnosis not present

## 2022-12-06 DIAGNOSIS — F4323 Adjustment disorder with mixed anxiety and depressed mood: Secondary | ICD-10-CM | POA: Diagnosis not present

## 2022-12-14 DIAGNOSIS — F4323 Adjustment disorder with mixed anxiety and depressed mood: Secondary | ICD-10-CM | POA: Diagnosis not present

## 2022-12-22 DIAGNOSIS — Z975 Presence of (intrauterine) contraceptive device: Secondary | ICD-10-CM | POA: Diagnosis not present

## 2022-12-22 DIAGNOSIS — R030 Elevated blood-pressure reading, without diagnosis of hypertension: Secondary | ICD-10-CM | POA: Diagnosis not present

## 2022-12-22 DIAGNOSIS — E559 Vitamin D deficiency, unspecified: Secondary | ICD-10-CM | POA: Diagnosis not present

## 2022-12-22 DIAGNOSIS — F909 Attention-deficit hyperactivity disorder, unspecified type: Secondary | ICD-10-CM | POA: Diagnosis not present

## 2022-12-22 DIAGNOSIS — Z79899 Other long term (current) drug therapy: Secondary | ICD-10-CM | POA: Diagnosis not present

## 2022-12-22 DIAGNOSIS — M797 Fibromyalgia: Secondary | ICD-10-CM | POA: Diagnosis not present

## 2022-12-22 DIAGNOSIS — E7800 Pure hypercholesterolemia, unspecified: Secondary | ICD-10-CM | POA: Diagnosis not present

## 2022-12-22 DIAGNOSIS — Z6826 Body mass index (BMI) 26.0-26.9, adult: Secondary | ICD-10-CM | POA: Diagnosis not present

## 2022-12-22 DIAGNOSIS — G43009 Migraine without aura, not intractable, without status migrainosus: Secondary | ICD-10-CM | POA: Diagnosis not present

## 2022-12-22 DIAGNOSIS — R202 Paresthesia of skin: Secondary | ICD-10-CM | POA: Diagnosis not present

## 2022-12-22 DIAGNOSIS — J302 Other seasonal allergic rhinitis: Secondary | ICD-10-CM | POA: Diagnosis not present

## 2022-12-23 DIAGNOSIS — F4323 Adjustment disorder with mixed anxiety and depressed mood: Secondary | ICD-10-CM | POA: Diagnosis not present

## 2022-12-27 DIAGNOSIS — F4323 Adjustment disorder with mixed anxiety and depressed mood: Secondary | ICD-10-CM | POA: Diagnosis not present

## 2023-01-05 DIAGNOSIS — F4323 Adjustment disorder with mixed anxiety and depressed mood: Secondary | ICD-10-CM | POA: Diagnosis not present

## 2023-01-10 DIAGNOSIS — F4323 Adjustment disorder with mixed anxiety and depressed mood: Secondary | ICD-10-CM | POA: Diagnosis not present

## 2023-01-18 DIAGNOSIS — Z79899 Other long term (current) drug therapy: Secondary | ICD-10-CM | POA: Diagnosis not present

## 2023-01-18 DIAGNOSIS — Z6824 Body mass index (BMI) 24.0-24.9, adult: Secondary | ICD-10-CM | POA: Diagnosis not present

## 2023-01-18 DIAGNOSIS — F909 Attention-deficit hyperactivity disorder, unspecified type: Secondary | ICD-10-CM | POA: Diagnosis not present

## 2023-01-18 DIAGNOSIS — M797 Fibromyalgia: Secondary | ICD-10-CM | POA: Diagnosis not present

## 2023-01-18 DIAGNOSIS — G43009 Migraine without aura, not intractable, without status migrainosus: Secondary | ICD-10-CM | POA: Diagnosis not present

## 2023-01-18 DIAGNOSIS — Z23 Encounter for immunization: Secondary | ICD-10-CM | POA: Diagnosis not present

## 2023-01-18 DIAGNOSIS — E7800 Pure hypercholesterolemia, unspecified: Secondary | ICD-10-CM | POA: Diagnosis not present

## 2023-01-18 DIAGNOSIS — J302 Other seasonal allergic rhinitis: Secondary | ICD-10-CM | POA: Diagnosis not present

## 2023-01-18 DIAGNOSIS — Z975 Presence of (intrauterine) contraceptive device: Secondary | ICD-10-CM | POA: Diagnosis not present

## 2023-01-24 DIAGNOSIS — J3081 Allergic rhinitis due to animal (cat) (dog) hair and dander: Secondary | ICD-10-CM | POA: Diagnosis not present

## 2023-01-24 DIAGNOSIS — F4323 Adjustment disorder with mixed anxiety and depressed mood: Secondary | ICD-10-CM | POA: Diagnosis not present

## 2023-01-24 DIAGNOSIS — R062 Wheezing: Secondary | ICD-10-CM | POA: Diagnosis not present

## 2023-01-24 DIAGNOSIS — J301 Allergic rhinitis due to pollen: Secondary | ICD-10-CM | POA: Diagnosis not present

## 2023-01-24 DIAGNOSIS — J3089 Other allergic rhinitis: Secondary | ICD-10-CM | POA: Diagnosis not present

## 2023-01-31 DIAGNOSIS — F4323 Adjustment disorder with mixed anxiety and depressed mood: Secondary | ICD-10-CM | POA: Diagnosis not present

## 2023-02-07 DIAGNOSIS — F4323 Adjustment disorder with mixed anxiety and depressed mood: Secondary | ICD-10-CM | POA: Diagnosis not present

## 2023-02-08 DIAGNOSIS — M797 Fibromyalgia: Secondary | ICD-10-CM | POA: Diagnosis not present

## 2023-02-08 DIAGNOSIS — Z79899 Other long term (current) drug therapy: Secondary | ICD-10-CM | POA: Diagnosis not present

## 2023-02-08 DIAGNOSIS — Z975 Presence of (intrauterine) contraceptive device: Secondary | ICD-10-CM | POA: Diagnosis not present

## 2023-02-08 DIAGNOSIS — J302 Other seasonal allergic rhinitis: Secondary | ICD-10-CM | POA: Diagnosis not present

## 2023-02-08 DIAGNOSIS — Z6824 Body mass index (BMI) 24.0-24.9, adult: Secondary | ICD-10-CM | POA: Diagnosis not present

## 2023-02-08 DIAGNOSIS — F909 Attention-deficit hyperactivity disorder, unspecified type: Secondary | ICD-10-CM | POA: Diagnosis not present

## 2023-02-08 DIAGNOSIS — E7800 Pure hypercholesterolemia, unspecified: Secondary | ICD-10-CM | POA: Diagnosis not present

## 2023-02-08 DIAGNOSIS — G43009 Migraine without aura, not intractable, without status migrainosus: Secondary | ICD-10-CM | POA: Diagnosis not present

## 2023-02-14 DIAGNOSIS — F4323 Adjustment disorder with mixed anxiety and depressed mood: Secondary | ICD-10-CM | POA: Diagnosis not present

## 2023-02-21 DIAGNOSIS — F4323 Adjustment disorder with mixed anxiety and depressed mood: Secondary | ICD-10-CM | POA: Diagnosis not present

## 2023-02-27 ENCOUNTER — Other Ambulatory Visit: Payer: Self-pay | Admitting: Family Medicine

## 2023-02-27 DIAGNOSIS — B379 Candidiasis, unspecified: Secondary | ICD-10-CM

## 2023-02-28 ENCOUNTER — Other Ambulatory Visit: Payer: Self-pay

## 2023-02-28 DIAGNOSIS — F4323 Adjustment disorder with mixed anxiety and depressed mood: Secondary | ICD-10-CM | POA: Diagnosis not present

## 2023-02-28 DIAGNOSIS — B379 Candidiasis, unspecified: Secondary | ICD-10-CM

## 2023-02-28 MED ORDER — FLUCONAZOLE 150 MG PO TABS: ORAL_TABLET | ORAL | 1 refills | Status: DC

## 2023-03-12 DIAGNOSIS — G43009 Migraine without aura, not intractable, without status migrainosus: Secondary | ICD-10-CM | POA: Diagnosis not present

## 2023-03-12 DIAGNOSIS — Z6824 Body mass index (BMI) 24.0-24.9, adult: Secondary | ICD-10-CM | POA: Diagnosis not present

## 2023-03-12 DIAGNOSIS — E7800 Pure hypercholesterolemia, unspecified: Secondary | ICD-10-CM | POA: Diagnosis not present

## 2023-03-12 DIAGNOSIS — Z79899 Other long term (current) drug therapy: Secondary | ICD-10-CM | POA: Diagnosis not present

## 2023-03-12 DIAGNOSIS — F909 Attention-deficit hyperactivity disorder, unspecified type: Secondary | ICD-10-CM | POA: Diagnosis not present

## 2023-03-12 DIAGNOSIS — M797 Fibromyalgia: Secondary | ICD-10-CM | POA: Diagnosis not present

## 2023-03-12 DIAGNOSIS — J302 Other seasonal allergic rhinitis: Secondary | ICD-10-CM | POA: Diagnosis not present

## 2023-03-12 DIAGNOSIS — Z975 Presence of (intrauterine) contraceptive device: Secondary | ICD-10-CM | POA: Diagnosis not present

## 2023-03-14 DIAGNOSIS — F4323 Adjustment disorder with mixed anxiety and depressed mood: Secondary | ICD-10-CM | POA: Diagnosis not present

## 2023-03-21 DIAGNOSIS — F4323 Adjustment disorder with mixed anxiety and depressed mood: Secondary | ICD-10-CM | POA: Diagnosis not present

## 2023-03-28 DIAGNOSIS — F4323 Adjustment disorder with mixed anxiety and depressed mood: Secondary | ICD-10-CM | POA: Diagnosis not present

## 2023-04-04 DIAGNOSIS — F4323 Adjustment disorder with mixed anxiety and depressed mood: Secondary | ICD-10-CM | POA: Diagnosis not present

## 2023-04-11 DIAGNOSIS — F4323 Adjustment disorder with mixed anxiety and depressed mood: Secondary | ICD-10-CM | POA: Diagnosis not present

## 2023-04-18 DIAGNOSIS — F4323 Adjustment disorder with mixed anxiety and depressed mood: Secondary | ICD-10-CM | POA: Diagnosis not present

## 2023-05-02 DIAGNOSIS — F4323 Adjustment disorder with mixed anxiety and depressed mood: Secondary | ICD-10-CM | POA: Diagnosis not present

## 2023-05-09 DIAGNOSIS — F4323 Adjustment disorder with mixed anxiety and depressed mood: Secondary | ICD-10-CM | POA: Diagnosis not present

## 2023-05-11 DIAGNOSIS — F4323 Adjustment disorder with mixed anxiety and depressed mood: Secondary | ICD-10-CM | POA: Diagnosis not present

## 2023-05-16 DIAGNOSIS — F4323 Adjustment disorder with mixed anxiety and depressed mood: Secondary | ICD-10-CM | POA: Diagnosis not present

## 2023-05-19 DIAGNOSIS — F3281 Premenstrual dysphoric disorder: Secondary | ICD-10-CM | POA: Diagnosis not present

## 2023-05-19 DIAGNOSIS — F909 Attention-deficit hyperactivity disorder, unspecified type: Secondary | ICD-10-CM | POA: Diagnosis not present

## 2023-05-19 DIAGNOSIS — F411 Generalized anxiety disorder: Secondary | ICD-10-CM | POA: Diagnosis not present

## 2023-05-23 DIAGNOSIS — F4323 Adjustment disorder with mixed anxiety and depressed mood: Secondary | ICD-10-CM | POA: Diagnosis not present

## 2023-05-24 DIAGNOSIS — J302 Other seasonal allergic rhinitis: Secondary | ICD-10-CM | POA: Diagnosis not present

## 2023-05-24 DIAGNOSIS — Z1331 Encounter for screening for depression: Secondary | ICD-10-CM | POA: Diagnosis not present

## 2023-05-24 DIAGNOSIS — F909 Attention-deficit hyperactivity disorder, unspecified type: Secondary | ICD-10-CM | POA: Diagnosis not present

## 2023-05-24 DIAGNOSIS — R0602 Shortness of breath: Secondary | ICD-10-CM | POA: Diagnosis not present

## 2023-05-24 DIAGNOSIS — Z1339 Encounter for screening examination for other mental health and behavioral disorders: Secondary | ICD-10-CM | POA: Diagnosis not present

## 2023-05-24 DIAGNOSIS — M797 Fibromyalgia: Secondary | ICD-10-CM | POA: Diagnosis not present

## 2023-05-24 DIAGNOSIS — Z6824 Body mass index (BMI) 24.0-24.9, adult: Secondary | ICD-10-CM | POA: Diagnosis not present

## 2023-05-24 DIAGNOSIS — E7800 Pure hypercholesterolemia, unspecified: Secondary | ICD-10-CM | POA: Diagnosis not present

## 2023-05-24 DIAGNOSIS — G43009 Migraine without aura, not intractable, without status migrainosus: Secondary | ICD-10-CM | POA: Diagnosis not present

## 2023-05-24 DIAGNOSIS — Z79899 Other long term (current) drug therapy: Secondary | ICD-10-CM | POA: Diagnosis not present

## 2023-05-24 DIAGNOSIS — Z0000 Encounter for general adult medical examination without abnormal findings: Secondary | ICD-10-CM | POA: Diagnosis not present

## 2023-05-24 DIAGNOSIS — Z975 Presence of (intrauterine) contraceptive device: Secondary | ICD-10-CM | POA: Diagnosis not present

## 2023-06-02 DIAGNOSIS — F4323 Adjustment disorder with mixed anxiety and depressed mood: Secondary | ICD-10-CM | POA: Diagnosis not present

## 2023-06-06 DIAGNOSIS — F4323 Adjustment disorder with mixed anxiety and depressed mood: Secondary | ICD-10-CM | POA: Diagnosis not present

## 2023-06-09 DIAGNOSIS — F3281 Premenstrual dysphoric disorder: Secondary | ICD-10-CM | POA: Diagnosis not present

## 2023-06-09 DIAGNOSIS — F909 Attention-deficit hyperactivity disorder, unspecified type: Secondary | ICD-10-CM | POA: Diagnosis not present

## 2023-06-09 DIAGNOSIS — F411 Generalized anxiety disorder: Secondary | ICD-10-CM | POA: Diagnosis not present

## 2023-06-13 DIAGNOSIS — F4323 Adjustment disorder with mixed anxiety and depressed mood: Secondary | ICD-10-CM | POA: Diagnosis not present

## 2023-06-20 DIAGNOSIS — F4323 Adjustment disorder with mixed anxiety and depressed mood: Secondary | ICD-10-CM | POA: Diagnosis not present

## 2023-06-26 DIAGNOSIS — F3281 Premenstrual dysphoric disorder: Secondary | ICD-10-CM | POA: Diagnosis not present

## 2023-06-26 DIAGNOSIS — F909 Attention-deficit hyperactivity disorder, unspecified type: Secondary | ICD-10-CM | POA: Diagnosis not present

## 2023-06-26 DIAGNOSIS — F411 Generalized anxiety disorder: Secondary | ICD-10-CM | POA: Diagnosis not present

## 2023-06-27 DIAGNOSIS — F4323 Adjustment disorder with mixed anxiety and depressed mood: Secondary | ICD-10-CM | POA: Diagnosis not present

## 2023-06-28 DIAGNOSIS — F909 Attention-deficit hyperactivity disorder, unspecified type: Secondary | ICD-10-CM | POA: Diagnosis not present

## 2023-06-28 DIAGNOSIS — F3281 Premenstrual dysphoric disorder: Secondary | ICD-10-CM | POA: Diagnosis not present

## 2023-06-28 DIAGNOSIS — F411 Generalized anxiety disorder: Secondary | ICD-10-CM | POA: Diagnosis not present

## 2023-07-04 DIAGNOSIS — F4323 Adjustment disorder with mixed anxiety and depressed mood: Secondary | ICD-10-CM | POA: Diagnosis not present

## 2023-07-11 DIAGNOSIS — F4323 Adjustment disorder with mixed anxiety and depressed mood: Secondary | ICD-10-CM | POA: Diagnosis not present

## 2023-07-16 ENCOUNTER — Encounter: Payer: Self-pay | Admitting: Emergency Medicine

## 2023-07-16 ENCOUNTER — Encounter (HOSPITAL_BASED_OUTPATIENT_CLINIC_OR_DEPARTMENT_OTHER): Payer: Self-pay | Admitting: Emergency Medicine

## 2023-07-16 ENCOUNTER — Emergency Department (HOSPITAL_BASED_OUTPATIENT_CLINIC_OR_DEPARTMENT_OTHER)

## 2023-07-16 ENCOUNTER — Emergency Department (HOSPITAL_BASED_OUTPATIENT_CLINIC_OR_DEPARTMENT_OTHER)
Admission: EM | Admit: 2023-07-16 | Discharge: 2023-07-16 | Disposition: A | Discharge status: Home / Self Care | Attending: Emergency Medicine | Admitting: Emergency Medicine

## 2023-07-16 ENCOUNTER — Other Ambulatory Visit: Payer: Self-pay

## 2023-07-16 ENCOUNTER — Ambulatory Visit: Admission: EM | Admit: 2023-07-16 | Discharge: 2023-07-16 | Disposition: A | Discharge status: Home / Self Care

## 2023-07-16 DIAGNOSIS — M79651 Pain in right thigh: Secondary | ICD-10-CM

## 2023-07-16 DIAGNOSIS — M79661 Pain in right lower leg: Secondary | ICD-10-CM | POA: Insufficient documentation

## 2023-07-16 DIAGNOSIS — M79604 Pain in right leg: Secondary | ICD-10-CM

## 2023-07-16 DIAGNOSIS — M25561 Pain in right knee: Secondary | ICD-10-CM | POA: Diagnosis not present

## 2023-07-16 MED ORDER — MELOXICAM 7.5 MG PO TABS: 7.5000 mg | ORAL_TABLET | Freq: Every day | ORAL | 0 refills | Status: DC

## 2023-07-16 NOTE — ED Provider Notes (Signed)
 [place] [facility] Provider Note   CSN: [id number] Arrival date & time: 07/16/23  1201     History  Chief Complaint  Patient presents with   Leg Pain    Kelly Rush is a 38 y.o. female.  Patient presents to the emergency department today for evaluation of left leg pain.  Pain began yesterday, no injury.  It was mainly behind her knee and posterior thigh.  Today she feels it down into her calf as well.  She has applied heating pad without improvement.  She also took baby aspirin this morning when the pain was worse. Patient denies risk factors for pulmonary embolism including: unilateral leg swelling, history of DVT/PE/other blood clots, use of exogenous hormones, recent immobilizations, recent surgery, recent travel (>4hr segment), malignancy, hemoptysis.  Patient does have a history of blood clot in her family and her brother and mother.  No diagnosed hypercoagulable states.         Home Medications Prior to Admission medications   Medication Sig Start Date End Date Taking? Authorizing Provider  amLODipine (NORVASC) 10 MG tablet TAKE 1 TABLET(10 MG) BY MOUTH DAILY Patient not taking: Reported on 06/06/2022 03/13/20   Lazaro Arms, MD  APTENSIO XR 20 MG CP24 Take 1 capsule by mouth every morning. 06/30/23   [provider]  Atogepant (QULIPTA) 60 MG TABS Qulipta    [provider]  buPROPion (WELLBUTRIN XL) 150 MG 24 hr tablet Take 300 mg by mouth daily. 06/11/23   [provider]  cetirizine (ZYRTEC) 10 MG chewable tablet Chew 10 mg by mouth daily.    [provider]  cyclobenzaprine (FLEXERIL) 10 MG tablet Take 1 tablet (10 mg total) by mouth 3 (three) times daily as needed. Patient not taking: Reported on 04/09/2019 02/22/19   Rasch, Harolyn Rutherford, NP  Elastic Bandages & Supports (COMFORT FIT MATERNITY SUPP MED) MISC 1 Device by Does not apply route daily. Patient not taking: Reported on 03/01/2019 09/17/18    Sharen Counter A, CNM  enalapril (VASOTEC) 10 MG tablet Take 1 tablet (10 mg total) by mouth daily. Patient not taking: Reported on 02/10/2021 04/09/19   Levie Heritage, DO  fluconazole (DIFLUCAN) 150 MG tablet Take 1 tablet by mouth. If symptoms persists repeat in 3 days. 02/28/23   Levie Heritage, DO  gabapentin (NEURONTIN) 300 MG capsule Take 300 mg by mouth 3 (three) times daily. Patient not taking: Reported on 06/06/2022    [provider]  ibuprofen (ADVIL) 800 MG tablet TAKE 1 TABLET(800 MG) BY MOUTH EVERY 8 HOURS AS NEEDED Patient not taking: Reported on 02/10/2021 02/19/19   Thressa Sheller D, CNM  levocetirizine (XYZAL) 5 MG tablet Take 5 mg by mouth daily.    [provider]  metroNIDAZOLE (FLAGYL) 500 MG tablet Take 1 tablet (500 mg total) by mouth 2 (two) times daily. Patient not taking: Reported on 07/16/2023 06/06/22   Milas Hock, MD  norethindrone (MICRONOR) 0.35 MG tablet Take 1 tablet (0.35 mg total) by mouth daily. Patient not taking: Reported on 04/09/2019 03/01/19   Venora Maples, MD  pantoprazole (PROTONIX) 40 MG tablet Take 1 tablet (40 mg total) by mouth daily. Patient not taking: Reported on 03/01/2019 08/20/18   Hurshel Party, CNM  Prenatal Vit-Fe Phos-FA-Omega (VITAFOL GUMMIES) 3.33-0.333-34.8 MG CHEW Chew 3 tablets by mouth daily before breakfast. Patient not taking: Reported on 06/06/2022 07/23/18   Brock Bad, MD  valACYclovir (VALTREX) 1000 MG tablet  Take 1 tablet (1,000 mg total) by mouth daily. Take for 5 days 06/06/22   Milas Hock, MD      Allergies    Patient has no known allergies.    Review of Systems   Review of Systems  Physical Exam Updated Vital Signs BP 128/89 (BP Location: Right Arm)   Pulse 87   Temp 98.5 F (36.9 C)   Resp 18   Ht 5\' 2"  (1.575 m)   Wt 60.8 kg   SpO2 99%   BMI 24.51 kg/m  Physical Exam Vitals and nursing note reviewed.  Constitutional:      Appearance: She is well-developed.   HENT:     Head: Normocephalic and atraumatic.  Eyes:     Pupils: Pupils are equal, round, and reactive to light.  Cardiovascular:     Pulses: Normal pulses. No decreased pulses.     Comments: 2+ DP pulse on the right foot, readily palpable.   Musculoskeletal:        General: Tenderness present.     Cervical back: Normal range of motion and neck supple.     Comments: Patient with posterior thigh tenderness, popliteal tenderness, proximal posterior calf tenderness.  No erythema or skin changes.  Skin:    General: Skin is warm and dry.  Neurological:     Mental Status: She is alert.     Sensory: No sensory deficit.     Comments: Motor, sensation, and vascular distal to the pain fully intact.  Psychiatric:        Mood and Affect: Mood normal.     ED Results / Procedures / Treatments   Labs (all labs ordered are listed, but only abnormal results are displayed) Labs Reviewed - No data to display  EKG None  Radiology US Venous Img Lower Right (DVT Study) Result Date: 07/16/2023 CLINICAL DATA:  Right posterior knee pain EXAM: RIGHT LOWER EXTREMITY VENOUS DOPPLER ULTRASOUND TECHNIQUE: Gray-scale sonography with compression, as well as color and duplex ultrasound, were performed to evaluate the deep venous system(s) from the level of the common femoral vein through the popliteal and proximal calf veins. COMPARISON:  None Available. FINDINGS: VENOUS Normal compressibility of the common femoral, superficial femoral, and popliteal veins, as well as the visualized calf veins. Visualized portions of profunda femoral vein and great saphenous vein unremarkable. No filling defects to suggest DVT on grayscale or color Doppler imaging. Doppler waveforms show normal direction of venous flow, normal respiratory plasticity and response to augmentation. Limited views of the contralateral common femoral vein are unremarkable. OTHER None. Limitations: none IMPRESSION: Negative. Electronically Signed   By:  Malachy Moan M.D.   On: 07/16/2023 13:03    Procedures Procedures    Medications Ordered in ED Medications - No data to display  ED Course/ Medical Decision Making/ A&P    Patient seen and examined. History obtained directly from patient.  Reviewed previous urgent care note.  Labs/EKG: None ordered Imaging: Lower remedy Doppler to evaluate for DVT  Medications/Fluids: None ordered  Most recent vital signs reviewed and are as follows: BP 128/89 (BP Location: Right Arm)   Pulse 87   Temp 98.5 F (36.9 C)   Resp 18   Ht 5\' 2"  (1.575 m)   Wt 60.8 kg   SpO2 99%   BMI 24.51 kg/m   Initial impression: Right lower extremity pain.  Consider musculoskeletal, radiculopathy from back, however no back pain currently.  Rule out DVT.  Low concern for arterial insufficiency.  1:37  PM Reassessment performed. Patient appears comfortable.  Imaging results reviewed: Negative DVT, no Baker's cyst  Reviewed pertinent lab work and imaging with patient at bedside. Questions answered.   Most current vital signs reviewed and are as follows: BP 128/89 (BP Location: Right Arm)   Pulse 87   Temp 98.5 F (36.9 C)   Resp 18   Ht 5\' 2"  (1.575 m)   Wt 60.8 kg   SpO2 99%   BMI 24.51 kg/m   Plan: Discharge to home.   Prescriptions written for: Meloxicam Other home care instructions discussed: Ice/heat, rest  ED return instructions discussed: Worsening swelling, redness, inability to ambulate or bear weight  Follow-up instructions discussed: Patient encouraged to follow-up with their PCP or sports medicine in 7 days if not improving.                               Medical Decision Making Risk Prescription drug management.   Patient with right lower extremity pain.  Extremities neurovascularly intact.  Normal pedal pulses.  DVT study negative.  No back pain and overall symptoms are not consistent with lumbar radiculopathy.  No erythema redness or warmth to suggest cellulitis,  myositis, abscess, necrotizing fasciitis.  Patient has normal range of motion which is reassuring.  Will treat as MSK pain and have patient follow-up as needed.  The patient's vital signs, pertinent lab work and imaging were reviewed and interpreted as discussed in the ED course. Hospitalization was considered for further testing, treatments, or serial exams/observation. However as patient is well-appearing, has a stable exam, and reassuring studies today, I do not feel that they warrant admission at this time. This plan was discussed with the patient who verbalizes agreement and comfort with this plan and seems reliable and able to return to the Emergency Department with worsening or changing symptoms.          Final Clinical Impression(s) / ED Diagnoses Final diagnoses:  Acute pain of right lower extremity    Rx / DC Orders ED Discharge Orders          Ordered    meloxicam (MOBIC) 7.5 MG tablet  Daily        07/16/23 1336              Renne Crigler, PA-C 07/16/23 1340    Anders Simmonds T, DO 07/18/23 [phone]

## 2023-07-16 NOTE — ED Provider Notes (Signed)
 [facility]    CSN: [id number] Arrival date & time: 07/16/23  1048      History   Chief Complaint Chief Complaint  Patient presents with   Leg Pain    HPI Kelly Rush is a 38 y.o. female.   Patient presents to clinic over concerns of right posterior thigh pain that started last night after she got home from work.  Last night her husband was giving her a massage and she fell asleep on the heating pad.  He did remove the heating pad.  This morning she woke up and the pain in her posterior thigh was worse.  Husband was massaging it and thought he felt a lump.  Pain is now radiating down into her calf and up into her buttock.  She is not having any shortness of breath.  Denies any low back pain.  No recent injury, trauma or falls.  Has not been sedentary or traveled recently.  She has an IUD. Does not smoke.   The history is provided by the patient and medical records.  Leg Pain   Past Medical History:  Diagnosis Date   Anemia    HSV (herpes simplex virus) infection     Patient Active Problem List   Diagnosis Date Noted   IUD (intrauterine device) in place 03/01/2019   HSV (herpes simplex virus) infection     History reviewed. No pertinent surgical history.  OB History     Gravida  3   Para  3   Term  3   Preterm      AB      Living  3      SAB      IAB      Ectopic      Multiple  0   Live Births  3            Home Medications    Prior to Admission medications   Medication Sig Start Date End Date Taking? Authorizing Provider  APTENSIO XR 20 MG CP24 Take 1 capsule by mouth every morning. 06/30/23  Yes [provider]  buPROPion (WELLBUTRIN XL) 150 MG 24 hr tablet Take 300 mg by mouth daily. 06/11/23  Yes [provider]  amLODipine (NORVASC) 10 MG tablet TAKE 1 TABLET(10 MG) BY MOUTH DAILY Patient not taking: Reported on 06/06/2022 03/13/20   Lazaro Arms, MD  Atogepant (QULIPTA) 60 MG TABS Bennie Pierini    [provider]  cetirizine (ZYRTEC) 10 MG chewable tablet Chew 10 mg by mouth daily.    [provider]  cyclobenzaprine (FLEXERIL) 10 MG tablet Take 1 tablet (10 mg total) by mouth 3 (three) times daily as needed. Patient not taking: Reported on 04/09/2019 02/22/19   Rasch, Harolyn Rutherford, NP  Elastic Bandages & Supports (COMFORT FIT MATERNITY SUPP MED) MISC 1 Device by Does not apply route daily. Patient not taking: Reported on 03/01/2019 09/17/18   Sharen Counter A, CNM  enalapril (VASOTEC) 10 MG tablet Take 1 tablet (10 mg total) by mouth daily. Patient not taking: Reported on 02/10/2021 04/09/19   Levie Heritage, DO  fluconazole (DIFLUCAN) 150 MG tablet Take 1 tablet by mouth. If symptoms persists repeat in 3 days. 02/28/23   Levie Heritage, DO  gabapentin (NEURONTIN) 300 MG capsule Take 300 mg by mouth 3 (three) times daily. Patient not taking: Reported on 06/06/2022    [provider]  ibuprofen (ADVIL) 800 MG tablet TAKE 1 TABLET(800 MG) BY MOUTH  EVERY 8 HOURS AS NEEDED Patient not taking: Reported on 02/10/2021 02/19/19   Thressa Sheller D, CNM  levocetirizine (XYZAL) 5 MG tablet Take 5 mg by mouth daily.    [provider]  metroNIDAZOLE (FLAGYL) 500 MG tablet Take 1 tablet (500 mg total) by mouth 2 (two) times daily. Patient not taking: Reported on 07/16/2023 06/06/22   Milas Hock, MD  norethindrone (MICRONOR) 0.35 MG tablet Take 1 tablet (0.35 mg total) by mouth daily. Patient not taking: Reported on 04/09/2019 03/01/19   Venora Maples, MD  pantoprazole (PROTONIX) 40 MG tablet Take 1 tablet (40 mg total) by mouth daily. Patient not taking: Reported on 03/01/2019 08/20/18   Hurshel Party, CNM  Prenatal Vit-Fe Phos-FA-Omega (VITAFOL GUMMIES) 3.33-0.333-34.8 MG CHEW Chew 3 tablets by mouth daily before breakfast. Patient not taking: Reported on 06/06/2022 07/23/18   Brock Bad, MD  valACYclovir (VALTREX) 1000 MG tablet Take 1 tablet (1,000 mg  total) by mouth daily. Take for 5 days 06/06/22   Milas Hock, MD    Family History Family History  Problem Relation Age of Onset   Hypertension Mother    Hypertension Father     Social History Social History   Tobacco Use   Smoking status: Never   Smokeless tobacco: Never  Vaping Use   Vaping status: Never Used  Substance Use Topics   Alcohol use: No   Drug use: No    Frequency: 3.0 times per week     Allergies   Patient has no known allergies.   Review of Systems Review of Systems  Per HPI  Physical Exam Triage Vital Signs ED Triage Vitals  Encounter Vitals Group     BP 07/16/23 1058 127/84     Systolic BP Percentile --      Diastolic BP Percentile --      Pulse Rate 07/16/23 1058 88     Resp 07/16/23 1058 17     Temp 07/16/23 1058 98.2 F (36.8 C)     Temp Source 07/16/23 1058 Oral     SpO2 07/16/23 1058 97 %     Weight --      Height --      Head Circumference --      Peak Flow --      Pain Score 07/16/23 1103 6     Pain Loc --      Pain Education --      Exclude from Growth Chart --    No data found.  Updated Vital Signs BP 127/84 (BP Location: Right Arm)   Pulse 88   Temp 98.2 F (36.8 C) (Oral)   Resp 17   SpO2 97%   Breastfeeding No   Visual Acuity Right Eye Distance:   Left Eye Distance:   Bilateral Distance:    Right Eye Near:   Left Eye Near:    Bilateral Near:     Physical Exam Vitals and nursing note reviewed.  Constitutional:      Appearance: Normal appearance.  HENT:     Head: Normocephalic and atraumatic.     Right Ear: External ear normal.     Left Ear: External ear normal.     Nose: Nose normal.     Mouth/Throat:     Mouth: Mucous membranes are moist.  Eyes:     Conjunctiva/sclera: Conjunctivae normal.  Cardiovascular:     Rate and Rhythm: Normal rate.  Pulmonary:     Effort: Pulmonary effort is normal. No respiratory  distress.  Musculoskeletal:        General: Tenderness present. No deformity or signs of  injury.       Legs:     Comments: Pain in the right distal posterior thigh, area is tender to palpation.  Without obvious skin changes, bruising, rash or darkening.  Pain radiates up into her buttocks and down into her calf.  Skin:    General: Skin is warm and dry.  Neurological:     General: No focal deficit present.     Mental Status: She is alert.  Psychiatric:        Mood and Affect: Mood normal.      UC Treatments / Results  Labs (all labs ordered are listed, but only abnormal results are displayed) Labs Reviewed - No data to display  EKG   Radiology No results found.  Procedures Procedures (including critical care time)  Medications Ordered in UC Medications - No data to display  Initial Impression / Assessment and Plan / UC Course  I have reviewed the triage vital signs and the nursing notes.  Pertinent labs & imaging results that were available during my care of the patient were reviewed by me and considered in my medical decision making (see chart for details).  Vitals and triage reviewed, patient is hemodynamically stable.  Having right posterior thigh pain, unable to palpate the mass that her husband fell.  No obvious skin changes.  Atraumatic, x-ray deferred.  Some concern for DVT with her presentation, advised to head to the nearest emergency department for further evaluation and potentially for ultrasound.  Feel as if she was stable to transport via POV, without any respiratory symptoms or tachycardia.  Patient verbalized understanding, will head to [facility]      Final Clinical Impressions(s) / UC Diagnoses   Final diagnoses:  Pain of right thigh     Discharge Instructions      I am concerned over your posterior right thigh pain, you may need an ultrasound.  Please head to med [facility] for further evaluation.    ED Prescriptions   None    PDMP not reviewed this encounter.   Octavius Shin, [place], [place] 07/16/23 [phone]

## 2023-07-16 NOTE — Discharge Instructions (Signed)
 I am concerned over your posterior right thigh pain, you may need an ultrasound.  Please head to med [facility] for further evaluation.

## 2023-07-16 NOTE — ED Triage Notes (Signed)
 Left leg pain since yesterday starts from behind knee and rads up and down leg  sharp and constant , denies any injury

## 2023-07-16 NOTE — ED Triage Notes (Signed)
 Pt right leg pain for 1 day. Pain is worse behind knee and radiates down and up leg.  Denies any injury

## 2023-07-16 NOTE — Discharge Instructions (Signed)
 Please read and follow all provided instructions.  Your diagnoses today include:  1. Acute pain of right lower extremity     Tests performed today include: Ultrasound to look for blood clot did not show any clots Vital signs. See below for your results today.   Medications prescribed:  Meloxicam - anti-inflammatory pain medication  You have been prescribed an anti-inflammatory medication or NSAID. Take with food. Do not take aspirin, ibuprofen, or naproxen if taking this medication. Take smallest effective dose for the shortest duration needed for your pain. Stop taking if you experience stomach pain or vomiting.   Take any prescribed medications only as directed.  Home care instructions:  Follow any educational materials contained in this packet Follow R.I.C.E. Protocol: R - rest your injury  I  - use ice on injury without applying directly to skin C - compress injury with bandage or splint E - elevate the injury as much as possible  Follow-up instructions: Please follow-up with your primary care provider or the provided sports medicine provider if you continue to have significant pain in 1 week. In this case you may have a more severe injury that requires further care.   Return instructions:  Please return if your toes or feet are numb or tingling, appear gray or blue, or you have severe pain (also elevate the leg and loosen splint or wrap if you were given one) Please return to the Emergency Department if you experience worsening symptoms.  Please return if you have any other emergent concerns.  Additional Information:  Your vital signs today were: BP 128/89 (BP Location: Right Arm)   Pulse 87   Temp 98.5 F (36.9 C)   Resp 18   Ht 5\' 2"  (1.575 m)   Wt 60.8 kg   SpO2 99%   BMI 24.51 kg/m  If your blood pressure (BP) was elevated above 135/85 this visit, please have this repeated by your doctor within one month. --------------

## 2023-07-17 ENCOUNTER — Ambulatory Visit (INDEPENDENT_AMBULATORY_CARE_PROVIDER_SITE_OTHER): Payer: Medicaid Other | Admitting: Obstetrics and Gynecology

## 2023-07-17 ENCOUNTER — Other Ambulatory Visit (HOSPITAL_COMMUNITY)
Admission: RE | Admit: 2023-07-17 | Discharge: 2023-07-17 | Disposition: A | Discharge status: Home / Self Care | Source: Ambulatory Visit | Attending: Obstetrics and Gynecology | Admitting: Obstetrics and Gynecology

## 2023-07-17 VITALS — BP 117/79 | HR 86 | Ht 62.0 in | Wt 135.0 lb

## 2023-07-17 DIAGNOSIS — Z124 Encounter for screening for malignant neoplasm of cervix: Secondary | ICD-10-CM | POA: Diagnosis not present

## 2023-07-17 DIAGNOSIS — Z01419 Encounter for gynecological examination (general) (routine) without abnormal findings: Secondary | ICD-10-CM | POA: Diagnosis not present

## 2023-07-17 NOTE — Progress Notes (Unsigned)
 ANNUAL EXAM Patient name: Kelly Rush MRN [id number]  Date of birth: February 12, 1986 Chief Complaint:   No chief complaint on file.  History of Present Illness:   Kelly Rush is a 38 y.o. G3P3003 being seen today for a routine annual exam.  Current complaints: pap,        Prospective documentation of physical and behavioral symptoms (using diaries) being present for most of the preceding year [25]. ?Five or more symptoms must have been present during the week prior to menses, resolving within a few days after menses starts. ?These criteria also specify that PMDD may be superimposed on other psychiatric disorders, provided it is not merely an exacerbation of those disorders. DSM-5 criteria -- One or more of the following symptoms must be present: ?Mood swings, sudden sadness, increased sensitivity to rejection ?Anger, irritability ?Sense of hopelessness, depressed mood, self-critical thoughts ?Tension, anxiety, feeling on edge One or more of the following symptoms must be present to reach a total of five symptoms overall: ?Difficulty concentrating ?Change in appetite, food cravings, overeating ?Diminished interest in usual activities ?Easy fatigability, decreased energy ?Feeling overwhelmed or out of control ?Breast tenderness, bloating, weight gain, or joint/muscles aches ?Sleeping too much or not sleeping enough  Patient's last menstrual period was 07/10/2023 (exact date).   The pregnancy intention screening data noted above was reviewed. Potential methods of contraception were discussed. The patient elected to proceed with No data recorded.   Last pap     Component Value Date/Time   DIAGPAP  02/10/2021 1347    - Negative for intraepithelial lesion or malignancy (NILM)   DIAGPAP  07/23/2018 0000    NEGATIVE FOR INTRAEPITHELIAL LESIONS OR MALIGNANCY.   DIAGPAP  07/23/2018 0000    FUNGAL ORGANISMS PRESENT CONSISTENT WITH CANDIDA SPP.   HPVHIGH Negative 02/10/2021 1347    ADEQPAP  02/10/2021 1347    Satisfactory for evaluation; transformation zone component PRESENT.   ADEQPAP  07/23/2018 0000    Satisfactory for evaluation  endocervical/transformation zone component PRESENT.     H/O abnormal pap: {yes/yes***/no:23866} Last mammogram: ***.  Last colonoscopy: ***.      05/23/2019    2:51 PM 05/09/2019    2:35 PM  Depression screen PHQ 2/9  Decreased Interest 1 1  Down, Depressed, Hopeless 0 0  PHQ - 2 Score 1 1  Altered sleeping 3 1  Tired, decreased energy 3 3  Change in appetite 1 0  Feeling bad or failure about yourself  1 1  Trouble concentrating 1 0  Moving slowly or fidgety/restless 0 0  Suicidal thoughts 0 0  PHQ-9 Score 10 6        05/23/2019    2:54 PM 05/09/2019    2:38 PM 04/09/2019    1:41 PM  GAD 7 : Generalized Anxiety Score  Nervous, Anxious, on Edge 1 3 3   Control/stop worrying 1 3 1   Worry too much - different things 1 3 3   Trouble relaxing 1 3 1   Restless 0 1 1  Easily annoyed or irritable 2 3 1   Afraid - awful might happen 1 1 1   Total GAD 7 Score 7 17 11   Anxiety Difficulty   Somewhat difficult     Review of Systems:   Pertinent items are noted in HPI Denies any headaches, blurred vision, fatigue, shortness of breath, chest pain, abdominal pain, abnormal vaginal discharge/itching/odor/irritation, problems with periods, bowel movements, urination, or intercourse unless otherwise stated above. Pertinent History Reviewed:  Reviewed past medical,surgical, social and family  history.  Reviewed problem list, medications and allergies. Physical Assessment:   Vitals:   07/17/23 1131  BP: 117/79  Pulse: 86  Weight: 135 lb (61.2 kg)  Height: 5\' 2"  (1.575 m)  Body mass index is 24.69 kg/m.        Physical Examination:   General appearance - well appearing, and in no distress  Mental status - alert, oriented to person, place, and time  Psych:  She has a normal mood and affect  Skin - warm and dry, normal color, no  suspicious lesions noted  Chest - effort normal, all lung fields clear to auscultation bilaterally  Heart - normal rate and regular rhythm  Breasts - breasts appear normal, no suspicious masses, no skin or nipple changes or  axillary nodes  Abdomen - soft, nontender, nondistended, no masses or organomegaly  Pelvic -  VULVA: normal appearing vulva with no masses, tenderness or lesions   VAGINA: normal appearing vagina with normal color and discharge, no lesions   CERVIX: normal appearing cervix without discharge or lesions, no CMT  Thin prep pap is {Desc; done/not:10129} *** HR HPV cotesting  UTERUS: uterus is felt to be normal size, shape, consistency and nontender   ADNEXA: No adnexal masses or tenderness noted.  Extremities:  No swelling or varicosities noted  Chaperone present for exam  No results found for this or any previous visit (from the past 24 hours).    Assessment & Plan:  There are no diagnoses linked to this encounter.      No orders of the defined types were placed in this encounter.   Meds: No orders of the defined types were placed in this encounter.   Follow-up: No follow-ups on file.  Lorriane Shire, MD 07/17/2023 11:38 AM

## 2023-07-18 DIAGNOSIS — F4323 Adjustment disorder with mixed anxiety and depressed mood: Secondary | ICD-10-CM | POA: Diagnosis not present

## 2023-07-20 LAB — CYTOLOGY - PAP
Chlamydia: NEGATIVE
Comment: NEGATIVE
Comment: NEGATIVE
Comment: NEGATIVE
Comment: NORMAL
Diagnosis: NEGATIVE
Diagnosis: REACTIVE
High risk HPV: NEGATIVE
Neisseria Gonorrhea: NEGATIVE
Trichomonas: NEGATIVE

## 2023-07-23 ENCOUNTER — Other Ambulatory Visit: Payer: Self-pay | Admitting: Obstetrics & Gynecology

## 2023-07-23 ENCOUNTER — Encounter (HOSPITAL_BASED_OUTPATIENT_CLINIC_OR_DEPARTMENT_OTHER): Payer: Self-pay | Admitting: Obstetrics & Gynecology

## 2023-07-23 DIAGNOSIS — B9689 Other specified bacterial agents as the cause of diseases classified elsewhere: Secondary | ICD-10-CM

## 2023-07-23 MED ORDER — METRONIDAZOLE 500 MG PO TABS: 500.0000 mg | ORAL_TABLET | Freq: Two times a day (BID) | ORAL | 0 refills | Status: DC

## 2023-07-25 DIAGNOSIS — F4323 Adjustment disorder with mixed anxiety and depressed mood: Secondary | ICD-10-CM | POA: Diagnosis not present

## 2023-07-26 DIAGNOSIS — F909 Attention-deficit hyperactivity disorder, unspecified type: Secondary | ICD-10-CM | POA: Diagnosis not present

## 2023-07-26 DIAGNOSIS — F411 Generalized anxiety disorder: Secondary | ICD-10-CM | POA: Diagnosis not present

## 2023-07-26 DIAGNOSIS — F3281 Premenstrual dysphoric disorder: Secondary | ICD-10-CM | POA: Diagnosis not present

## 2023-08-01 DIAGNOSIS — F4323 Adjustment disorder with mixed anxiety and depressed mood: Secondary | ICD-10-CM | POA: Diagnosis not present

## 2023-08-08 DIAGNOSIS — F4323 Adjustment disorder with mixed anxiety and depressed mood: Secondary | ICD-10-CM | POA: Diagnosis not present

## 2023-08-15 DIAGNOSIS — F4323 Adjustment disorder with mixed anxiety and depressed mood: Secondary | ICD-10-CM | POA: Diagnosis not present

## 2023-08-22 DIAGNOSIS — F4323 Adjustment disorder with mixed anxiety and depressed mood: Secondary | ICD-10-CM | POA: Diagnosis not present

## 2023-08-24 DIAGNOSIS — R059 Cough, unspecified: Secondary | ICD-10-CM | POA: Diagnosis not present

## 2023-08-24 DIAGNOSIS — Z6824 Body mass index (BMI) 24.0-24.9, adult: Secondary | ICD-10-CM | POA: Diagnosis not present

## 2023-08-24 DIAGNOSIS — J018 Other acute sinusitis: Secondary | ICD-10-CM | POA: Diagnosis not present

## 2023-08-25 DIAGNOSIS — F411 Generalized anxiety disorder: Secondary | ICD-10-CM | POA: Diagnosis not present

## 2023-08-25 DIAGNOSIS — F909 Attention-deficit hyperactivity disorder, unspecified type: Secondary | ICD-10-CM | POA: Diagnosis not present

## 2023-08-25 DIAGNOSIS — F3281 Premenstrual dysphoric disorder: Secondary | ICD-10-CM | POA: Diagnosis not present

## 2023-08-29 DIAGNOSIS — F4323 Adjustment disorder with mixed anxiety and depressed mood: Secondary | ICD-10-CM | POA: Diagnosis not present

## 2023-09-05 DIAGNOSIS — F4323 Adjustment disorder with mixed anxiety and depressed mood: Secondary | ICD-10-CM | POA: Diagnosis not present

## 2023-09-08 DIAGNOSIS — F4323 Adjustment disorder with mixed anxiety and depressed mood: Secondary | ICD-10-CM | POA: Diagnosis not present

## 2023-09-12 DIAGNOSIS — F4323 Adjustment disorder with mixed anxiety and depressed mood: Secondary | ICD-10-CM | POA: Diagnosis not present

## 2023-09-19 DIAGNOSIS — F4323 Adjustment disorder with mixed anxiety and depressed mood: Secondary | ICD-10-CM | POA: Diagnosis not present

## 2023-09-22 DIAGNOSIS — F411 Generalized anxiety disorder: Secondary | ICD-10-CM | POA: Diagnosis not present

## 2023-09-22 DIAGNOSIS — F909 Attention-deficit hyperactivity disorder, unspecified type: Secondary | ICD-10-CM | POA: Diagnosis not present

## 2023-09-22 DIAGNOSIS — F3281 Premenstrual dysphoric disorder: Secondary | ICD-10-CM | POA: Diagnosis not present

## 2023-09-26 DIAGNOSIS — F4323 Adjustment disorder with mixed anxiety and depressed mood: Secondary | ICD-10-CM | POA: Diagnosis not present

## 2023-10-03 ENCOUNTER — Ambulatory Visit

## 2023-10-03 VITALS — BP 120/81 | HR 98 | Ht 63.0 in | Wt 136.0 lb

## 2023-10-03 DIAGNOSIS — L240 Irritant contact dermatitis due to detergents: Secondary | ICD-10-CM

## 2023-10-03 DIAGNOSIS — F4323 Adjustment disorder with mixed anxiety and depressed mood: Secondary | ICD-10-CM | POA: Diagnosis not present

## 2023-10-03 MED ORDER — FLUCONAZOLE 150 MG PO TABS: 150.0000 mg | ORAL_TABLET | ORAL | 0 refills | Status: DC | PRN

## 2023-10-03 NOTE — Progress Notes (Signed)
   GYNECOLOGY PROBLEM OFFICE VISIT NOTE  History:  Kelly Rush is a 38 y.o. [phone] here today for rash. She reports on Friday she got some "period panties" that are reusable underwear.  She states she has been using these, for menstruation, since November with no issues.  She states she started wearing the current pair on Saturday and started to experience irritation (itching, redness, and discomfort with sitting) later that evening. She reports soaking on Sunday with no relief. She also reports using cortisone cream (2.5%) with some relief. She reports she did not wash them prior to wear because her washing machine was broke.    Past Medical History:  Diagnosis Date   Anemia    HSV (herpes simplex virus) infection     No past surgical history on file.  The following portions of the patient's history were reviewed and updated as appropriate: allergies, current medications, past family history, past medical history, past social history, past surgical history and problem list.   Health Maintenance:  Normal pap and negative HRHPV on March 2025.  No mammogram on file d/t age.   Review of Systems:  Genito-Urinary ROS: negative Gastrointestinal ROS: negative Objective:  Vitals: BP 120/81 (BP Location: Left Arm, Patient Position: Sitting, Cuff Size: Normal)   Pulse 98   Ht 5\' 3"  (1.6 m)   Wt 136 lb (61.7 kg)   LMP 09/26/2023 (Exact Date)   BMI 24.09 kg/m   Physical Exam: Physical Exam Constitutional:      Appearance: Normal appearance.  HENT:     Head: Normocephalic and atraumatic.  Eyes:     Conjunctiva/sclera: Conjunctivae normal.  Cardiovascular:     Rate and Rhythm: Normal rate.  Pulmonary:     Effort: Pulmonary effort is normal. No respiratory distress.  Musculoskeletal:        General: Normal range of motion.  Neurological:     Mental Status: She is alert and oriented to person, place, and time.  Skin:    General: Skin is warm and dry.     Comments: No apparent  rash on buttocks or perineum area.    Psychiatric:        Mood and Affect: Mood normal.        Behavior: Behavior normal.  Vitals reviewed. Exam conducted with a chaperone present ([place], [place] & [place], [place]).      Labs and Imaging: No results found.  Assessment & Plan:  38 year old Contact Dermatitis   -Informed that no rash noted.  -However, considering c/o itching and previous redness will treat as contact dermatitis.  -Discussed continued usage of cortisone cream and will send diflucan  for itching. -Patient agreeable. -Encouraged to hand wash all peri underwear with bathing soap, prior to usage. -Monitor and report worsening of condition. Otherwise return prn or for yearly exam.    Total face-to-face time with patient: 15 minutes   Loetta Ringer, CNM 10/03/2023 9:07 AM

## 2023-10-05 ENCOUNTER — Encounter: Payer: Self-pay | Admitting: Family Medicine

## 2023-10-05 ENCOUNTER — Ambulatory Visit: Payer: Medicaid Other | Admitting: Family Medicine

## 2023-10-05 VITALS — BP 98/68 | HR 74 | Temp 97.8°F | Ht 63.0 in | Wt 137.0 lb

## 2023-10-05 DIAGNOSIS — J302 Other seasonal allergic rhinitis: Secondary | ICD-10-CM | POA: Diagnosis not present

## 2023-10-05 DIAGNOSIS — Z8679 Personal history of other diseases of the circulatory system: Secondary | ICD-10-CM | POA: Diagnosis not present

## 2023-10-05 DIAGNOSIS — Z7689 Persons encountering health services in other specified circumstances: Secondary | ICD-10-CM

## 2023-10-05 DIAGNOSIS — Z8759 Personal history of other complications of pregnancy, childbirth and the puerperium: Secondary | ICD-10-CM

## 2023-10-05 DIAGNOSIS — G43009 Migraine without aura, not intractable, without status migrainosus: Secondary | ICD-10-CM

## 2023-10-05 DIAGNOSIS — G8929 Other chronic pain: Secondary | ICD-10-CM | POA: Diagnosis not present

## 2023-10-05 DIAGNOSIS — F909 Attention-deficit hyperactivity disorder, unspecified type: Secondary | ICD-10-CM | POA: Diagnosis not present

## 2023-10-05 DIAGNOSIS — F3281 Premenstrual dysphoric disorder: Secondary | ICD-10-CM

## 2023-10-05 NOTE — Progress Notes (Signed)
 Established Patient Office Visit   Subjective  Patient ID: Kelly Rush, female    DOB: 1986/02/09  Age: 38 y.o. MRN: [id number]  Chief Complaint  Patient presents with   New Patient (Initial Visit)    Patient is a 38 year old female seen to establish care and follow-up on chronic conditions.  Patient previously seen at [facility].  Migraines: She has experienced migraines since 2010, characterized by pain that varies in location and can switch sides.  Typically in right frontal area or left occipital area of head.  Associated symptoms include nausea, photophobia, phonophobia, and occasionally blurred vision. She is currently taking Qulipta, which has reduced her migraine frequency to one or two per month from over ten per month. She also uses Ubrelvy for breakthrough migraines, but it has not been effective.  Was seeing neurology.  Requested a referral for a new neurologist from previous PCP but had not heard back about referral.  ADHD: Diagnosed in 2021 after experiencing symptoms of disorganization and feeling scattered following the birth of her son in 2020. She is currently under the care of a psychiatrist at [facility].  Taking Aptensio XR 30 mg daily and Adderall 5 mg daily.  PMDD: taking Wellbutrin, which has improved her symptoms. She noted that her ADHD medication was less effective around her menstrual cycle, leading to emotional instability.  History of chronic pain: States in 2021 she was told she had fibromyalgia.  Was having pain all over her body that made it difficult to get out of bed or walk.  Occurred when patient was postpartum so she was unable to take most meds. Initial treatments included SSRIs, but she has tried multiple medications without relief. She was prescribed pregabalin last year, tried it, but does not currently take it. She experiences intermittent pain, particularly in her hands and wrists, and notes that weather changes can trigger flares.  Tries to exercise to help with symptoms.  Allergies: Will take OTC Xyzal as needed.  She experienced postpartum preeclampsia and had high blood pressure for two years following childbirth.  Allergies: NKDA  Social history: Patient is married.  She is a stay-at-home mom.  Denies alcohol, tobacco, drug use.  Her family history includes migraines and high blood pressure in her parents and  migraines in siblings.    Patient Active Problem List   Diagnosis Date Noted   IUD (intrauterine device) in place 03/01/2019   HSV (herpes simplex virus) infection    Past Medical History:  Diagnosis Date   Anemia    HSV (herpes simplex virus) infection    History reviewed. No pertinent surgical history. Social History   Tobacco Use   Smoking status: Never   Smokeless tobacco: Never  Vaping Use   Vaping status: Never Used  Substance Use Topics   Alcohol use: No   Drug use: No    Frequency: 3.0 times per week   Family History  Problem Relation Age of Onset   Hypertension Mother    Hypertension Father    No Known Allergies  ROS Negative unless stated above    Objective:      BP 98/68 (BP Location: Left Arm, Patient Position: Sitting, Cuff Size: Normal)   Pulse 74   Temp 97.8 F (36.6 C) (Oral)   Ht 5\' 3"  (1.6 m)   Wt 137 lb (62.1 kg)   LMP 09/26/2023 (Exact Date)   SpO2 98%   BMI 24.27 kg/m  BP Readings from Last 3 Encounters:  10/05/23 98/68  10/03/23 120/81  07/17/23 117/79   Wt Readings from Last 3 Encounters:  10/05/23 137 lb (62.1 kg)  10/03/23 136 lb (61.7 kg)  07/17/23 135 lb (61.2 kg)      Physical Exam Constitutional:      General: She is not in acute distress.    Appearance: Normal appearance.  HENT:     Head: Normocephalic and atraumatic.     Nose: Nose normal.     Mouth/Throat:     Mouth: Mucous membranes are moist.  Cardiovascular:     Rate and Rhythm: Normal rate and regular rhythm.     Heart sounds: Normal heart sounds. No murmur  heard.    No gallop.  Pulmonary:     Effort: Pulmonary effort is normal. No respiratory distress.     Breath sounds: Normal breath sounds. No wheezing, rhonchi or rales.  Skin:    General: Skin is warm and dry.  Neurological:     Mental Status: She is alert and oriented to person, place, and time.       07/17/2023   11:38 AM 05/23/2019    2:51 PM 05/09/2019    2:35 PM  Depression screen PHQ 2/9  Decreased Interest 1 1 1   Down, Depressed, Hopeless 0 0 0  PHQ - 2 Score 1 1 1   Altered sleeping 0 3 1  Tired, decreased energy 0 3 3  Change in appetite 0 1 0  Feeling bad or failure about yourself  0 1 1  Trouble concentrating 1 1 0  Moving slowly or fidgety/restless 0 0 0  Suicidal thoughts 0 0 0  PHQ-9 Score 2 10 6       07/17/2023   11:38 AM 05/23/2019    2:54 PM 05/09/2019    2:38 PM 04/09/2019    1:41 PM  GAD 7 : Generalized Anxiety Score  Nervous, Anxious, on Edge 0 1 3 3   Control/stop worrying 0 1 3 1   Worry too much - different things 0 1 3 3   Trouble relaxing 0 1 3 1   Restless 0 0 1 1  Easily annoyed or irritable 0 2 3 1   Afraid - awful might happen 0 1 1 1   Total GAD 7 Score 0 7 17 11   Anxiety Difficulty    Somewhat difficult     No results found for any visits on 10/05/23.    Assessment & Plan:   Seasonal allergies  Attention deficit hyperactivity disorder (ADHD), unspecified ADHD type  PMDD (premenstrual dysphoric disorder)  Migraine without aura and without status migrainosus, not intractable  History of postpartum pre-eclampsia  Encounter to establish care  Other chronic pain   Seasonal allergies controlled on OTC Xyzal as needed.  ADHD controlled.  PMDP reviewed and appropriate.  Continue Aptensio XR 30 mg and Adderall 5 mg daily per psychiatry at [facility].  PMDD controlled on Wellbutrin XL 300 mg daily.  Patient refused PHQ-9 and GAD-7 this visit.  Continue follow-up with OB/GYN and psychiatry.  Migraines currently controlled.   Continue Qulipta.  Discussed migraine prevention.  Will likely need new referral to neurologist.  Will review records once obtained from prior PCP regarding chronic pain then make further recommendations after review.  Return in about 3 months (around 01/05/2024) for chronic conditions.   Viola Greulich, MD

## 2023-10-05 NOTE — Patient Instructions (Signed)
 It was nice meeting you today.  We will try to get records from your previous provider to help determine which next steps may need to be taken.

## 2023-10-06 DIAGNOSIS — F4323 Adjustment disorder with mixed anxiety and depressed mood: Secondary | ICD-10-CM | POA: Diagnosis not present

## 2023-10-10 DIAGNOSIS — F4323 Adjustment disorder with mixed anxiety and depressed mood: Secondary | ICD-10-CM | POA: Diagnosis not present

## 2023-10-15 ENCOUNTER — Encounter: Payer: Self-pay | Admitting: Family Medicine

## 2023-10-17 DIAGNOSIS — F4323 Adjustment disorder with mixed anxiety and depressed mood: Secondary | ICD-10-CM | POA: Diagnosis not present

## 2023-10-24 DIAGNOSIS — F4323 Adjustment disorder with mixed anxiety and depressed mood: Secondary | ICD-10-CM | POA: Diagnosis not present

## 2023-10-30 ENCOUNTER — Encounter: Payer: Self-pay | Admitting: Obstetrics and Gynecology

## 2023-10-30 ENCOUNTER — Other Ambulatory Visit: Payer: Self-pay

## 2023-10-30 DIAGNOSIS — B009 Herpesviral infection, unspecified: Secondary | ICD-10-CM

## 2023-10-30 MED ORDER — VALACYCLOVIR HCL 1 G PO TABS: 1000.0000 mg | ORAL_TABLET | Freq: Every day | ORAL | 6 refills | Status: AC

## 2023-10-31 DIAGNOSIS — F4323 Adjustment disorder with mixed anxiety and depressed mood: Secondary | ICD-10-CM | POA: Diagnosis not present

## 2023-11-07 DIAGNOSIS — F4323 Adjustment disorder with mixed anxiety and depressed mood: Secondary | ICD-10-CM | POA: Diagnosis not present

## 2023-11-21 DIAGNOSIS — F4323 Adjustment disorder with mixed anxiety and depressed mood: Secondary | ICD-10-CM | POA: Diagnosis not present

## 2023-11-28 DIAGNOSIS — F4323 Adjustment disorder with mixed anxiety and depressed mood: Secondary | ICD-10-CM | POA: Diagnosis not present

## 2023-11-29 DIAGNOSIS — F3281 Premenstrual dysphoric disorder: Secondary | ICD-10-CM | POA: Diagnosis not present

## 2023-11-29 DIAGNOSIS — F909 Attention-deficit hyperactivity disorder, unspecified type: Secondary | ICD-10-CM | POA: Diagnosis not present

## 2023-11-29 DIAGNOSIS — F411 Generalized anxiety disorder: Secondary | ICD-10-CM | POA: Diagnosis not present

## 2023-12-05 DIAGNOSIS — F4323 Adjustment disorder with mixed anxiety and depressed mood: Secondary | ICD-10-CM | POA: Diagnosis not present

## 2023-12-12 DIAGNOSIS — F4323 Adjustment disorder with mixed anxiety and depressed mood: Secondary | ICD-10-CM | POA: Diagnosis not present

## 2023-12-19 DIAGNOSIS — F4323 Adjustment disorder with mixed anxiety and depressed mood: Secondary | ICD-10-CM | POA: Diagnosis not present

## 2023-12-26 DIAGNOSIS — F4323 Adjustment disorder with mixed anxiety and depressed mood: Secondary | ICD-10-CM | POA: Diagnosis not present

## 2023-12-27 DIAGNOSIS — F909 Attention-deficit hyperactivity disorder, unspecified type: Secondary | ICD-10-CM | POA: Diagnosis not present

## 2023-12-27 DIAGNOSIS — F411 Generalized anxiety disorder: Secondary | ICD-10-CM | POA: Diagnosis not present

## 2023-12-27 DIAGNOSIS — F3281 Premenstrual dysphoric disorder: Secondary | ICD-10-CM | POA: Diagnosis not present

## 2024-01-02 DIAGNOSIS — F4323 Adjustment disorder with mixed anxiety and depressed mood: Secondary | ICD-10-CM | POA: Diagnosis not present

## 2024-01-05 ENCOUNTER — Ambulatory Visit: Admitting: Family Medicine

## 2024-01-05 ENCOUNTER — Encounter: Payer: Self-pay | Admitting: Neurology

## 2024-01-05 ENCOUNTER — Telehealth: Payer: Self-pay

## 2024-01-05 ENCOUNTER — Encounter: Payer: Self-pay | Admitting: Family Medicine

## 2024-01-05 VITALS — BP 128/80 | HR 95 | Temp 97.9°F | Ht 63.0 in | Wt 135.0 lb

## 2024-01-05 DIAGNOSIS — G43009 Migraine without aura, not intractable, without status migrainosus: Secondary | ICD-10-CM | POA: Diagnosis not present

## 2024-01-05 DIAGNOSIS — Z23 Encounter for immunization: Secondary | ICD-10-CM | POA: Diagnosis not present

## 2024-01-05 DIAGNOSIS — L709 Acne, unspecified: Secondary | ICD-10-CM | POA: Diagnosis not present

## 2024-01-05 DIAGNOSIS — F3281 Premenstrual dysphoric disorder: Secondary | ICD-10-CM

## 2024-01-05 DIAGNOSIS — F909 Attention-deficit hyperactivity disorder, unspecified type: Secondary | ICD-10-CM | POA: Diagnosis not present

## 2024-01-05 MED ORDER — CLINDAMYCIN PALMITATE HCL 75 MG/5ML PO SOLR: ORAL | 0 refills | Status: DC

## 2024-01-05 NOTE — Telephone Encounter (Signed)
 Copied from CRM [id number]. Topic: Clinical - Prescription Issue >> Jan 05, 2024 10:39 AM Macario HERO wrote: Reason for CRM: [facility] from [facility] calling to verify if clindamycin  (CLEOCIN ) 75 MG/5ML solution [[id number]] request correct because she has never seen it being used on the face. [phone]  Fax: [phone]

## 2024-01-05 NOTE — Progress Notes (Addendum)
 Established Patient Office Visit   Subjective  Patient ID: Kelly Rush, female    DOB: February 27, 1986  Age: 38 y.o. MRN: [id number]  Chief Complaint  Patient presents with   Medical Management of Chronic Issues    91m f/u    Pt is a 38 yo female seen for f/u.  Pt notes having 1-2 migraines since last OFV.  Qulipta helps.  In the past Ubrelvy did not.  Would like referral to a new Neurologist.   Pt seeing Psychiatry for ADHD.  Recently started on Adderall XR 15 mg daily and Adderal IR 5 mg at noon.  Pt feels like XR is helping some but has only been on med  x 1 wk.  Notes med seems less effective near menses.  Also notes generic versions do not seem to work as well.   Pt notes increased acne around jaw line.  Tried various OTC cleansers and products without improvement.  Does not eat fast food.  Cooks at home.  Drinking mostly water.  Has IUD in place.  Was advised it could remain in place longer.  Pt does not want any unplanned pregnancies at this time and does not want tubal ligation.  In the past other birth control options did not work well for her.    No outside records received despite pt completing request.    Patient Active Problem List   Diagnosis Date Noted   IUD (intrauterine device) in place 03/01/2019   HSV (herpes simplex virus) infection    Past Medical History:  Diagnosis Date   Anemia    HSV (herpes simplex virus) infection    No past surgical history on file. Social History   Tobacco Use   Smoking status: Never   Smokeless tobacco: Never  Vaping Use   Vaping status: Never Used  Substance Use Topics   Alcohol use: No   Drug use: No    Frequency: 3.0 times per week   Family History  Problem Relation Age of Onset   Hypertension Mother    High Cholesterol Mother    Hypertension Father    Asthma Father    Asthma Daughter    Asthma Son    Arthritis Maternal Grandmother    Heart disease Maternal Grandmother    High Cholesterol Maternal Grandmother     Heart disease Maternal Grandfather    High Cholesterol Maternal Grandfather    Heart disease Paternal Grandmother    High Cholesterol Paternal Grandmother    Heart disease Paternal Grandfather    High Cholesterol Paternal Grandfather    No Known Allergies  ROS Negative unless stated above    Objective:     BP (!) 138/90 (BP Location: Left Arm, Patient Position: Sitting, Cuff Size: Normal)   Pulse 95   Temp 97.9 F (36.6 C) (Oral)   Ht 5' 3 (1.6 m)   Wt 135 lb (61.2 kg)   LMP 01/01/2024 (Exact Date)   SpO2 99%   BMI 23.91 kg/m  BP Readings from Last 3 Encounters:  01/05/24 (!) 138/90  10/05/23 98/68  10/03/23 120/81   Wt Readings from Last 3 Encounters:  01/05/24 135 lb (61.2 kg)  10/05/23 137 lb (62.1 kg)  10/03/23 136 lb (61.7 kg)      Physical Exam Constitutional:      General: She is not in acute distress.    Appearance: Normal appearance.  HENT:     Head: Normocephalic and atraumatic.     Nose: Nose normal.  Mouth/Throat:     Mouth: Mucous membranes are moist.  Cardiovascular:     Rate and Rhythm: Normal rate and regular rhythm.     Heart sounds: Normal heart sounds. No murmur heard.    No gallop.  Pulmonary:     Effort: Pulmonary effort is normal. No respiratory distress.     Breath sounds: Normal breath sounds. No wheezing, rhonchi or rales.  Skin:    General: Skin is warm and dry.     Comments: Acne of face around jaw line and forehead.  Neurological:     Mental Status: She is alert and oriented to person, place, and time.        07/17/2023   11:38 AM 05/23/2019    2:51 PM 05/09/2019    2:35 PM  Depression screen PHQ 2/9  Decreased Interest 1 1 1   Down, Depressed, Hopeless 0 0 0  PHQ - 2 Score 1 1 1   Altered sleeping 0 3 1  Tired, decreased energy 0 3 3  Change in appetite 0 1 0  Feeling bad or failure about yourself  0 1 1  Trouble concentrating 1 1 0  Moving slowly or fidgety/restless 0 0 0  Suicidal thoughts 0 0 0  PHQ-9 Score 2  10 6       07/17/2023   11:38 AM 05/23/2019    2:54 PM 05/09/2019    2:38 PM 04/09/2019    1:41 PM  GAD 7 : Generalized Anxiety Score  Nervous, Anxious, on Edge 0 1 3 3   Control/stop worrying 0 1 3 1   Worry too much - different things 0 1 3 3   Trouble relaxing 0 1 3 1   Restless 0 0 1 1  Easily annoyed or irritable 0 2 3 1   Afraid - awful might happen 0 1 1 1   Total GAD 7 Score 0 7 17 11   Anxiety Difficulty    Somewhat difficult     No results found for any visits on 01/05/24.    Assessment & Plan:   Acne, unspecified acne type -     Clindamycin  Phosphate; Apply topically 2 (two) times daily. Apply a thin layer to face.  Dispense: 60 mL; Refill: 0  Migraine without aura and without status migrainosus, not intractable -     Ambulatory referral to Neurology  PMDD (premenstrual dysphoric disorder)  Attention deficit hyperactivity disorder (ADHD), unspecified ADHD type  Need for influenza vaccination -     Flu vaccine trivalent PF, 6mos and older(Flulaval,Afluria,Fluarix,Fluzone)  Discussed consistent facial cleansing routine. Clindamycin  lotion BID.  Migraines stable.  Continue Ubrelvy and prevention.  Referral to Neuro placed.  Continue f/u with Psych for ADHD and PMDD management.   No follow-ups on file.  Will schedule f/u for after records received.  I personally spent a total of 41 minutes in the care of the patient today including preparing to see the patient, getting/reviewing separately obtained history, performing a medically appropriate exam/evaluation, counseling and educating, placing orders, referring and communicating with other health care professionals, documenting clinical information in the EHR, independently interpreting results, and coordinating care.   Clotilda JONELLE Single, MD

## 2024-01-10 ENCOUNTER — Encounter: Payer: Self-pay | Admitting: Family Medicine

## 2024-01-10 MED ORDER — CLINDAMYCIN PHOSPHATE 1 % EX LOTN: TOPICAL_LOTION | Freq: Two times a day (BID) | CUTANEOUS | 0 refills | Status: AC

## 2024-01-10 NOTE — Addendum Note (Signed)
 Addended by: MERCER KIRSCH R on: 01/10/2024 12:32 PM   Modules accepted: Orders

## 2024-01-10 NOTE — Telephone Encounter (Signed)
 Rx changed to Clindamycin  1% lotion.

## 2024-01-16 DIAGNOSIS — F4323 Adjustment disorder with mixed anxiety and depressed mood: Secondary | ICD-10-CM | POA: Diagnosis not present

## 2024-01-22 DIAGNOSIS — F411 Generalized anxiety disorder: Secondary | ICD-10-CM | POA: Diagnosis not present

## 2024-01-22 DIAGNOSIS — F909 Attention-deficit hyperactivity disorder, unspecified type: Secondary | ICD-10-CM | POA: Diagnosis not present

## 2024-01-22 DIAGNOSIS — F3281 Premenstrual dysphoric disorder: Secondary | ICD-10-CM | POA: Diagnosis not present

## 2024-01-23 DIAGNOSIS — F4323 Adjustment disorder with mixed anxiety and depressed mood: Secondary | ICD-10-CM | POA: Diagnosis not present

## 2024-01-24 DIAGNOSIS — J301 Allergic rhinitis due to pollen: Secondary | ICD-10-CM | POA: Diagnosis not present

## 2024-01-24 DIAGNOSIS — J3089 Other allergic rhinitis: Secondary | ICD-10-CM | POA: Diagnosis not present

## 2024-01-24 DIAGNOSIS — J4520 Mild intermittent asthma, uncomplicated: Secondary | ICD-10-CM | POA: Diagnosis not present

## 2024-01-24 DIAGNOSIS — J3081 Allergic rhinitis due to animal (cat) (dog) hair and dander: Secondary | ICD-10-CM | POA: Diagnosis not present

## 2024-01-31 DIAGNOSIS — F4323 Adjustment disorder with mixed anxiety and depressed mood: Secondary | ICD-10-CM | POA: Diagnosis not present

## 2024-02-14 DIAGNOSIS — F4323 Adjustment disorder with mixed anxiety and depressed mood: Secondary | ICD-10-CM | POA: Diagnosis not present

## 2024-02-19 DIAGNOSIS — F3281 Premenstrual dysphoric disorder: Secondary | ICD-10-CM | POA: Diagnosis not present

## 2024-02-19 DIAGNOSIS — F411 Generalized anxiety disorder: Secondary | ICD-10-CM | POA: Diagnosis not present

## 2024-02-19 DIAGNOSIS — F909 Attention-deficit hyperactivity disorder, unspecified type: Secondary | ICD-10-CM | POA: Diagnosis not present

## 2024-02-27 DIAGNOSIS — F4323 Adjustment disorder with mixed anxiety and depressed mood: Secondary | ICD-10-CM | POA: Diagnosis not present

## 2024-03-04 ENCOUNTER — Ambulatory Visit

## 2024-03-05 DIAGNOSIS — F4323 Adjustment disorder with mixed anxiety and depressed mood: Secondary | ICD-10-CM | POA: Diagnosis not present

## 2024-03-20 DIAGNOSIS — F909 Attention-deficit hyperactivity disorder, unspecified type: Secondary | ICD-10-CM | POA: Diagnosis not present

## 2024-03-20 DIAGNOSIS — F3281 Premenstrual dysphoric disorder: Secondary | ICD-10-CM | POA: Diagnosis not present

## 2024-03-20 DIAGNOSIS — F411 Generalized anxiety disorder: Secondary | ICD-10-CM | POA: Diagnosis not present

## 2024-03-29 ENCOUNTER — Encounter: Payer: Self-pay | Admitting: Family Medicine

## 2024-04-01 MED ORDER — QULIPTA 60 MG PO TABS: 60.0000 mg | ORAL_TABLET | ORAL | 0 refills | Status: DC | PRN

## 2024-04-03 ENCOUNTER — Telehealth: Admitting: Family Medicine

## 2024-04-03 DIAGNOSIS — G43009 Migraine without aura, not intractable, without status migrainosus: Secondary | ICD-10-CM

## 2024-04-03 MED ORDER — PREDNISONE 10 MG (21) PO TBPK: ORAL_TABLET | ORAL | 0 refills | Status: DC

## 2024-04-03 NOTE — Progress Notes (Signed)
 Virtual Visit Consent   Kelly Rush, you are scheduled for a virtual visit with a [facility] provider today. Just as with appointments in the office, your consent must be obtained to participate. Your consent will be active for this visit and any virtual visit you may have with one of our providers in the next 365 days. If you have a MyChart account, a copy of this consent can be sent to you electronically.  As this is a virtual visit, video technology does not allow for your provider to perform a traditional examination. This may limit your provider's ability to fully assess your condition. If your provider identifies any concerns that need to be evaluated in person or the need to arrange testing (such as labs, EKG, etc.), we will make arrangements to do so. Although advances in technology are sophisticated, we cannot ensure that it will always work on either your end or our end. If the connection with a video visit is poor, the visit may have to be switched to a telephone visit. With either a video or telephone visit, we are not always able to ensure that we have a secure connection.  By engaging in this virtual visit, you consent to the provision of healthcare and authorize for your insurance to be billed (if applicable) for the services provided during this visit. Depending on your insurance coverage, you may receive a charge related to this service.  I need to obtain your verbal consent now. Are you willing to proceed with your visit today? Kelly Rush has provided verbal consent on 04/03/2024 for a virtual visit (video or telephone). [name], NP  Date: 04/03/2024 2:48 PM   Virtual Visit via Video Note   I, [name], connected with  Kelly Rush  ([id number], [birth year]) on 04/03/24 at  2:45 PM EST by a video-enabled telemedicine application and verified that I am speaking with the correct person using two identifiers.  Location: Patient: Virtual Visit Location Patient:  Home Provider: Virtual Visit Location Provider: Home Office   I discussed the limitations of evaluation and management by telemedicine and the availability of in person appointments. The patient expressed understanding and agreed to proceed.    History of Present Illness: Kelly Rush is a 38 y.o. who identifies as a female who was assigned female at birth, and is being seen today for migraine/sinus headache  Onset was 4-5 days ago. It is different from her usual migraines- as she has nausea, reflux. Pressure around eyes, forehead, temples, and foggy headed- no recent URI symptoms.  Light and sound sensitivity.  Modifying factors are ibuprofen , tylenol , salt water and honey drinking (this one helped the most).  Denies chest pain, shortness of breath, fevers, chills, vision changes, weakness, dizziness.    Problems:  Patient Active Problem List   Diagnosis Date Noted   IUD (intrauterine device) in place 03/01/2019   HSV (herpes simplex virus) infection     Allergies: No Known Allergies Medications:  Current Outpatient Medications:    amphetamine-dextroamphetamine (ADDERALL XR) 15 MG 24 hr capsule, Take by mouth every morning., Disp: , Rfl:    amphetamine-dextroamphetamine (ADDERALL XR) 5 MG 24 hr capsule, Take 5 mg by mouth daily., Disp: , Rfl:    Atogepant (QULIPTA) 60 MG TABS, Take 1 tablet (60 mg total) by mouth as needed (migraine)., Disp: 30 tablet, Rfl: 0   buPROPion (WELLBUTRIN XL) 150 MG 24 hr tablet, Take 300 mg by mouth daily. 150 mg twice daily, Disp: , Rfl:  cetirizine (ZYRTEC) 10 MG chewable tablet, Chew 10 mg by mouth daily., Disp: , Rfl:    clindamycin  (CLEOCIN  T) 1 % lotion, Apply topically 2 (two) times daily. Apply a thin layer to face., Disp: 60 mL, Rfl: 0   fluconazole  (DIFLUCAN ) 150 MG tablet, Take 1 tablet (150 mg total) by mouth as needed. (Patient not taking: Reported on 01/05/2024), Disp: 2 tablet, Rfl: 0   levocetirizine (XYZAL) 5 MG tablet, Take 5 mg by mouth  daily., Disp: , Rfl:    valACYclovir  (VALTREX ) 1000 MG tablet, Take 1 tablet (1,000 mg total) by mouth daily. Take for 5 days, Disp: 5 tablet, Rfl: 6  Observations/Objective: Patient is well-developed, well-nourished in no acute distress.  Resting comfortably  at home.  Head is normocephalic, atraumatic.  No labored breathing.  Speech is clear and coherent with logical content.  Patient is alert and oriented at baseline.    Assessment and Plan:  1. Migraine without aura and without status migrainosus, not intractable (Primary)  - predniSONE (STERAPRED UNI-PAK 21 TAB) 10 MG (21) TBPK tablet; Take as directed  Dispense: 21 tablet; Refill: 0  -complete medication -hydrate well -rest -discuss abortive medication options for migraines in future -info on AVS  Reviewed side effects, risks and benefits of medication.    Patient acknowledged agreement and understanding of the plan.   Past Medical, Surgical, Social History, Allergies, and Medications have been Reviewed.     Follow Up Instructions: I discussed the assessment and treatment plan with the patient. The patient was provided an opportunity to ask questions and all were answered. The patient agreed with the plan and demonstrated an understanding of the instructions.  A copy of instructions were sent to the patient via MyChart unless otherwise noted below.    The patient was advised to call back or seek an in-person evaluation if the symptoms worsen or if the condition fails to improve as anticipated.    [name], NP

## 2024-04-03 NOTE — Patient Instructions (Addendum)
 Kelly Rush, thank you for joining Kelly CHRISTELLA Barefoot, NP for today's virtual visit.  While this provider is not your primary care provider (PCP), if your PCP is located in our provider database this encounter information will be shared with them immediately following your visit.   A [facility] MyChart account gives you access to today's visit and all your visits, tests, and labs performed at [facility]  click here if you don't have a [facility] MyChart account or go to [email or web address]  Consent: (Patient) Kelly Rush provided verbal consent for this virtual visit at the beginning of the encounter.  Current Medications:  Current Outpatient Medications:    predniSONE (STERAPRED UNI-PAK 21 TAB) 10 MG (21) TBPK tablet, Take as directed, Disp: 21 tablet, Rfl: 0   amphetamine-dextroamphetamine (ADDERALL XR) 15 MG 24 hr capsule, Take by mouth every morning., Disp: , Rfl:    amphetamine-dextroamphetamine (ADDERALL XR) 5 MG 24 hr capsule, Take 5 mg by mouth daily., Disp: , Rfl:    Atogepant (QULIPTA) 60 MG TABS, Take 1 tablet (60 mg total) by mouth as needed (migraine)., Disp: 30 tablet, Rfl: 0   buPROPion (WELLBUTRIN XL) 150 MG 24 hr tablet, Take 300 mg by mouth daily. 150 mg twice daily, Disp: , Rfl:    cetirizine (ZYRTEC) 10 MG chewable tablet, Chew 10 mg by mouth daily., Disp: , Rfl:    clindamycin  (CLEOCIN  T) 1 % lotion, Apply topically 2 (two) times daily. Apply a thin layer to face., Disp: 60 mL, Rfl: 0   fluconazole  (DIFLUCAN ) 150 MG tablet, Take 1 tablet (150 mg total) by mouth as needed. (Patient not taking: Reported on 01/05/2024), Disp: 2 tablet, Rfl: 0   levocetirizine (XYZAL) 5 MG tablet, Take 5 mg by mouth daily., Disp: , Rfl:    valACYclovir  (VALTREX ) 1000 MG tablet, Take 1 tablet (1,000 mg total) by mouth daily. Take for 5 days, Disp: 5 tablet, Rfl: 6   Medications ordered in this encounter:  Meds ordered this encounter  Medications   predniSONE (STERAPRED  UNI-PAK 21 TAB) 10 MG (21) TBPK tablet    Sig: Take as directed    Dispense:  21 tablet    Refill:  0    Supervising Provider:   BLAISE ALEENE KIDD [[id number]]     *If you need refills on other medications prior to your next appointment, please contact your pharmacy*  Follow-Up: Call back or seek an in-person evaluation if the symptoms worsen or if the condition fails to improve as anticipated.  [facility] [phone]  Other Instructions Migraine Headache A migraine headache is an intense pulsing or throbbing pain on one or both sides of the head. Migraine headaches may also cause other symptoms, such as nausea, vomiting, and sensitivity to light and noise. A migraine headache can last from 4 hours to 3 days. Talk with your health care provider about what things may bring on (trigger) your migraine headaches. What are the causes? The exact cause is not known. However, a migraine may be caused when nerves in the brain get irritated and release chemicals that cause blood vessels to become inflamed. This inflammation causes pain. Migraines may be triggered or caused by: Smoking. Medicines, such as: Nitroglycerin, which is used to treat chest pain. Birth control pills. Estrogen. Certain blood pressure medicines. Foods or drinks that contain nitrates, glutamate, aspartame, MSG, or tyramine. Certain foods or drinks, such as aged cheeses, chocolate, alcohol, or caffeine . Doing physical activity that is very hard. Other  triggers may include: Menstruation. Pregnancy. Hunger. Stress. Getting too much or too little sleep. Weather changes. Tiredness (fatigue). What increases the risk? The following factors may make you more likely to have migraine headaches: Being between the ages of 17-11 years old. Being female. Having a family history of migraine headaches. Being Caucasian. Having a mental health condition, such as depression or anxiety. Being obese. What are the  signs or symptoms? The main symptom of this condition is pulsing or throbbing pain. This pain may: Happen in any area of the head, such as on one or both sides. Make it hard to do daily activities. Get worse with physical activity. Get worse around bright lights, loud noises, or smells. Other symptoms may include: Nausea. Vomiting. Dizziness. Before a migraine headache starts, you may get warning signs (an aura). An aura may include: Seeing flashing lights or having blind spots. Seeing bright spots, halos, or zigzag lines. Having tunnel vision or blurred vision. Having numbness or a tingling feeling. Having trouble talking. Having muscle weakness. After a migraine ends, you may have symptoms. These may include: Feeling tired. Trouble concentrating. How is this diagnosed? A migraine headache can be diagnosed based on: Your symptoms. A physical exam. Tests, such as: A CT scan or an MRI of the head. These tests can help rule out other causes of headaches. Taking fluid from the spine (lumbar puncture) to examine it (cerebrospinal fluid analysis, or CSF analysis). How is this treated? This condition may be treated with medicines that: Relieve pain and nausea. Prevent migraines. Treatment may also include: Acupuncture. Lifestyle changes like avoiding foods that trigger migraine headaches. Learning ways to control your body (biofeedback). Talk therapy to help you know and deal with negative thoughts (cognitive behavioral therapy). Follow these instructions at home: Medicines Take over-the-counter and prescription medicines only as told by your provider. Ask your provider if the medicine prescribed to you: Requires you to avoid driving or using machinery. Can cause constipation. You may need to take these actions to prevent or treat constipation: Drink enough fluid to keep your pee (urine) pale yellow. Take over-the-counter or prescription medicines. Eat foods that are high in  fiber, such as beans, whole grains, and fresh fruits and vegetables. Limit foods that are high in fat and processed sugars, such as fried or sweet foods. Lifestyle  Do not drink alcohol. Do not use any products that contain nicotine or tobacco. These products include cigarettes, chewing tobacco, and vaping devices, such as e-cigarettes. If you need help quitting, ask your provider. Get 7-9 hours of sleep each night, or the amount recommended by your provider. Find ways to manage stress, such as meditation, deep breathing, or yoga. Try to exercise regularly. This can help lessen how bad and how often your migraines occur. General instructions Keep a journal to find out what triggers your migraines, so you can avoid those things. For example, write down: What you eat and drink. How much sleep you get. Any change to your diet or medicines. If you have a migraine headache: Avoid things that make your symptoms worse, such as bright lights. Lie down in a dark, quiet room. Do not drive or use machinery. Ask your provider what activities are safe for you while you have symptoms. Keep all follow-up visits. Your provider will monitor your symptoms and recommend any further treatment. Where to find more information Coalition for Headache and Migraine Patients (CHAMP): headachemigraine.org American Migraine Foundation: americanmigrainefoundation.org National Headache Foundation: headaches.org Contact a health care provider if: You  have symptoms that are different or worse than your usual migraine headache symptoms. You have more than 15 days of headaches in one month. Get help right away if: Your migraine headache becomes severe or lasts more than 72 hours. You have a fever or stiff neck. You have vision loss. Your muscles feel weak or like you cannot control them. You lose your balance often or have trouble walking. You faint. You have a seizure. This information is not intended to replace  advice given to you by your health care provider. Make sure you discuss any questions you have with your health care provider. Document Revised: 12/13/2021 Document Reviewed: 12/13/2021 Elsevier Patient Education  2024 Elsevier Inc.   If you have been instructed to have an in-person evaluation today at a local Urgent Care facility, please use the link below. It will take you to a list of all of our available [facility], including address, phone number and hours of operation. Please do not delay care.  [facility]  If you or a family member do not have a primary care provider, use the link below to schedule a visit and establish care. When you choose a [facility] primary care physician or advanced practice provider, you gain a long-term partner in health. Find a Primary Care Provider  Learn more about [facility]'s in-office and virtual care options: [facility] - Get Care Now

## 2024-04-30 ENCOUNTER — Other Ambulatory Visit: Payer: Self-pay | Admitting: Family Medicine

## 2024-04-30 ENCOUNTER — Ambulatory Visit
Admission: EM | Admit: 2024-04-30 | Discharge: 2024-04-30 | Disposition: A | Discharge status: Home / Self Care | Attending: Emergency Medicine | Admitting: Emergency Medicine

## 2024-04-30 DIAGNOSIS — J029 Acute pharyngitis, unspecified: Secondary | ICD-10-CM | POA: Insufficient documentation

## 2024-04-30 LAB — POCT RAPID STREP A (OFFICE): Rapid Strep A Screen: NEGATIVE

## 2024-04-30 NOTE — Discharge Instructions (Addendum)
 Strep is negative, culture is pending, if culture is positive we will call you in an antibiotic. Take over the counter meds for symptom management of your choice(dayquil,Nyquil,mucinex, chloraseptic throat lozenges, etc). Follow up with PCP in 3 days  if worse.

## 2024-04-30 NOTE — ED Provider Notes (Signed)
 Kelly Rush    CSN: [id number] Arrival date & time: 04/30/24  1141      History   Chief Complaint Chief Complaint  Patient presents with   Sore Throat   Otalgia    HPI Kelly Rush is a 38 y.o. female.   38 year old female, Kelly Rush, presents to urgent care for sore throat, left sided neck and left ear pain x 1 day. Pt has been taking tylenol  and throat numbing spray.  No known illness exposure.  The history is provided by the patient. No language interpreter was used.  Sore Throat  Otalgia Associated symptoms: neck pain and sore throat   Associated symptoms: no fever     Past Medical History:  Diagnosis Date   Anemia    HSV (herpes simplex virus) infection     Patient Active Problem List   Diagnosis Date Noted   Sore throat 04/30/2024   IUD (intrauterine device) in place 03/01/2019   HSV (herpes simplex virus) infection     History reviewed. No pertinent surgical history.  OB History     Gravida  3   Para  3   Term  3   Preterm      AB      Living  3      SAB      IAB      Ectopic      Multiple  0   Live Births  3            Home Medications    Prior to Admission medications  Medication Sig Start Date End Date Taking? Authorizing Provider  amphetamine-dextroamphetamine (ADDERALL XR) 15 MG 24 hr capsule Take by mouth every morning. 12/27/23   [provider]  amphetamine-dextroamphetamine (ADDERALL XR) 5 MG 24 hr capsule Take 5 mg by mouth daily.    [provider]  buPROPion (WELLBUTRIN XL) 150 MG 24 hr tablet Take 300 mg by mouth daily. 150 mg twice daily 06/11/23   [provider]  cetirizine (ZYRTEC) 10 MG chewable tablet Chew 10 mg by mouth daily.    [provider]  clindamycin  (CLEOCIN  T) 1 % lotion Apply topically 2 (two) times daily. Apply a thin layer to face. 01/10/24   Mercer Clotilda SAUNDERS, MD  fluconazole  (DIFLUCAN ) 150 MG tablet Take 1 tablet (150 mg total) by mouth as  needed. Patient not taking: Reported on 01/05/2024 10/03/23   Synthia Raisin, CNM  levocetirizine (XYZAL) 5 MG tablet Take 5 mg by mouth daily.    [provider]  predniSONE  (STERAPRED UNI-PAK 21 TAB) 10 MG (21) TBPK tablet Take as directed 04/03/24   Moishe Chiquita HERO, NP  QULIPTA  60 MG TABS TAKE 1 TABLET BY MOUTH AS NEEDED FOR MIGRAINE 04/30/24   Mercer Clotilda SAUNDERS, MD  valACYclovir  (VALTREX ) 1000 MG tablet Take 1 tablet (1,000 mg total) by mouth daily. Take for 5 days 10/30/23   Abigail Rollo DASEN, MD    Family History Family History  Problem Relation Age of Onset   Hypertension Mother    High Cholesterol Mother    Hypertension Father    Asthma Father    Asthma Daughter    Asthma Son    Arthritis Maternal Grandmother    Heart disease Maternal Grandmother    High Cholesterol Maternal Grandmother    Heart disease Maternal Grandfather    High Cholesterol Maternal Grandfather    Heart disease Paternal Grandmother    High Cholesterol Paternal Grandmother  Heart disease Paternal Grandfather    High Cholesterol Paternal Grandfather     Social History Social History[1]   Allergies   Patient has no known allergies.   Review of Systems Review of Systems  Constitutional:  Negative for fever.  HENT:  Positive for ear pain and sore throat.   Musculoskeletal:  Positive for neck pain.  All other systems reviewed and are negative.    Physical Exam Triage Vital Signs ED Triage Vitals  Encounter Vitals Group     BP 04/30/24 1205 124/78     Girls Systolic BP Percentile --      Girls Diastolic BP Percentile --      Boys Systolic BP Percentile --      Boys Diastolic BP Percentile --      Pulse Rate 04/30/24 1205 (!) 102     Resp 04/30/24 1205 16     Temp 04/30/24 1205 99.3 F (37.4 C)     Temp Source 04/30/24 1205 Oral     SpO2 04/30/24 1205 98 %     Weight --      Height --      Head Circumference --      Peak Flow --      Pain Score 04/30/24 1204 8     Pain Loc --       Pain Education --      Exclude from Growth Chart --    No data found.  Updated Vital Signs BP 124/78 (BP Location: Right Arm)   Pulse (!) 102   Temp 99.3 F (37.4 C) (Oral)   Resp 16   SpO2 98%   Visual Acuity Right Eye Distance:   Left Eye Distance:   Bilateral Distance:    Right Eye Near:   Left Eye Near:    Bilateral Near:     Physical Exam Vitals and nursing note reviewed.  Constitutional:      General: She is not in acute distress.    Appearance: She is well-developed and well-groomed.  HENT:     Head: Normocephalic.     Right Ear: Tympanic membrane is retracted.     Left Ear: Tympanic membrane is retracted.     Nose: Mucosal edema and congestion present.     Mouth/Throat:     Lips: Pink.     Mouth: Mucous membranes are moist.     Pharynx: Oropharynx is clear. Uvula midline.     Tonsils: No tonsillar exudate or tonsillar abscesses.  Eyes:     General: Lids are normal.     Conjunctiva/sclera: Conjunctivae normal.     Pupils: Pupils are equal, round, and reactive to light.  Neck:     Trachea: No tracheal deviation.  Cardiovascular:     Rate and Rhythm: Normal rate and regular rhythm.     Heart sounds: Normal heart sounds. No murmur heard. Pulmonary:     Effort: Pulmonary effort is normal.     Breath sounds: Normal breath sounds and air entry.  Abdominal:     General: Bowel sounds are normal.     Palpations: Abdomen is soft.     Tenderness: There is no abdominal tenderness.  Musculoskeletal:        General: Normal range of motion.     Cervical back: Normal range of motion.  Lymphadenopathy:     Cervical: No cervical adenopathy.  Skin:    General: Skin is warm and dry.     Findings: No rash.  Neurological:  General: No focal deficit present.     Mental Status: She is alert and oriented to person, place, and time.     GCS: GCS eye subscore is 4. GCS verbal subscore is 5. GCS motor subscore is 6.  Psychiatric:        Attention and Perception:  Attention normal.        Mood and Affect: Mood normal.        Speech: Speech normal.        Behavior: Behavior normal. Behavior is cooperative.      [facility]ush Treatments / Results  Labs (all labs ordered are listed, but only abnormal results are displayed) Labs Reviewed  CULTURE, GROUP A STREP [facility])  POCT RAPID STREP A (OFFICE)    EKG   Radiology No results found.  Procedures Procedures (including critical care time)  Medications Ordered in [facility] Medications - No data to display  Initial Impression / Assessment and Plan / Rush Course  I have reviewed the triage vital signs and the nursing notes.  Pertinent labs & imaging results that were available during my care of the patient were reviewed by me and considered in my medical decision making (see chart for details).    Discussed exam findings and plan of care with patient :strep is negative, culture is pending, if culture is positive we will call you in an antibiotic(amoxicillin ). Take over the counter meds for symptom management of your choice(dayquil,Nyquil,mucinex, chloraseptic throat lozenges, etc). Follow up with PCP in 3 days  if worse.  Patient verbalized understanding to this provider  Ddx: Viral pharyngitis, strep, allergies Final Clinical Impressions(s) / [facility] Diagnoses   Final diagnoses:  Sore throat     Discharge Instructions      Strep is negative, culture is pending, if culture is positive we will call you in an antibiotic. Take over the counter meds for symptom management of your choice(dayquil,Nyquil,mucinex, chloraseptic throat lozenges, etc). Follow up with PCP in 3 days  if worse.     ED Prescriptions   None    PDMP not reviewed this encounter.     [1]  Social History Tobacco Use   Smoking status: Never   Smokeless tobacco: Never  Vaping Use   Vaping status: Never Used  Substance Use Topics   Alcohol use: No   Drug use: No    Frequency: 3.0 times per week     Aminta Loose,  NP 04/30/24 2202  "

## 2024-04-30 NOTE — ED Triage Notes (Signed)
 Pt c/o sore throat, left sided neck and left ear discomfort that began yesterday.  Home interventions: tylenol , throat numbing spray

## 2024-05-03 ENCOUNTER — Ambulatory Visit (HOSPITAL_COMMUNITY): Payer: Self-pay

## 2024-05-03 LAB — CULTURE, GROUP A STREP (THRC)

## 2024-05-14 NOTE — Progress Notes (Unsigned)
 "  NEUROLOGY CONSULTATION NOTE  Kelly Rush MRN: [id number] DOB: 08/03/1985  Referring provider: Clotilda Single, MD Primary care provider: Clotilda Single, MD  Reason for consult:  migraines  Assessment/Plan:   Migraine without aura, without status migrainosus, not intractable (a couple of times a year, may have status migrainosus)  Migraine prevention:  Qulipta  60mg  daily Migraine rescue:  Sumatriptan  50-100mg  as needed.  Zofran  4mg  for nausea Lifestyle modification: Limit use of pain relievers to no more than 9 days out of the month to prevent risk of rebound or medication-overuse headache. Diet modification/hydration/caffeine  cessation Routine exercise Sleep hygiene Consider vitamins/supplements:  magnesium  citrate 400mg  daily, riboflavin 400mg  daily, CoQ10 100mg  three times daily Keep headache diary Follow up 6 months    Subjective:  Kelly Rush is a 39 year old right-handed female with fibromyalgia who presents for migraines.  History supplemented by referring provider's note.  CT personally reviewed.  She has history of migraines since she was about 83 or 39 years old.  They are severe.  They may be pounding, stabbing, pressure.  They may be in the bilateral temporal region or occipital region.  Associated with photophobia, phonophobia, sometimes osmophobia, sometimes nausea.  More recently, vision becomes more blurry.  They last days untreated.  Otherwise, it may last hours to all day.  They were daily (migraines 8-9 a month) prior to starting Qulipta  in 2024.  Since then, they occur 1 to 2 times a month.  Triggers include emotional stress and tiredness (also worse while pregnant).  Relieving factors include ibuprofen  and sleep.  Occasionally, she gets status migrainosus, lasting anywhere from 4 days to 10 days, about every 6 months.  Last month, she had a persistent headache for 4 days.  She was prescribed a prednisone  taper which helped.  Prior workup for  headache: 02/16/2019 CT HEAD:  Normal head CT without contrast.  Past NSAIDS/analgesics:  Fioricet, Tylenol  Past abortive triptans:  none Past abortive ergotamine:  none Past muscle relaxants:  none Past anti-emetic:  Zofran -ODT 4mg , Reglan  10mg  Past antihypertensive medications:  amlodipine  Past antidepressant/antipsychotic medications:  amitriptyline, duloxetine, sertraline Past anticonvulsant medications:  topiramate Past anti-CGRP:  Nurtec Past vitamins/Herbal/Supplements:  none Past antihistamines/decongestants:  Benadryl  Other past therapies:  none  Current NSAIDS/analgesics:  ibuprofen  Current triptans:  none Current ergotamine:  none Current anti-emetic:  none Current muscle relaxants:  none Current Antihypertensive medications:  none Current Antidepressant/antipsychotic medications:  Wellbutrin XL  Current Anticonvulsant medications:  none Current anti-CGRP:  Qulipta  60mg  daily, Ubrelvy (ineffective) Current Antihistamines/Decongestants:  Zyrtec, Xyzal Birth control:  none Other medications:  Adderall   Caffeine :  rarely drinks coffee Alcohol:  no Smoker:  no Diet:  hydrates.  Vegetables, fruits, protein, occasional Snicker's bar.  Does not eat out much Exercise:  twice a week.  Cheer coach Depression:  stable; Anxiety:  stable Other pain:  fibromyalgia, TMJ dysfunction, neck pain Sleep hygiene:  not ideal.  Stays up late but still wakes up early.  6-7 hours a night  History of TBI/concussion:  no Family history of headache:  parents, brother, sister, daughter Family history of cerebral aneurysm:  paternal great uncle      PAST MEDICAL HISTORY: Past Medical History:  Diagnosis Date   Anemia    HSV (herpes simplex virus) infection     PAST SURGICAL HISTORY: No past surgical history on file.  MEDICATIONS: Medications Ordered Prior to Encounter[1]  ALLERGIES: Allergies[2]  FAMILY HISTORY: Family History  Problem Relation Age of Onset    Hypertension Mother  High Cholesterol Mother    Hypertension Father    Asthma Father    Asthma Daughter    Asthma Son    Arthritis Maternal Grandmother    Heart disease Maternal Grandmother    High Cholesterol Maternal Grandmother    Heart disease Maternal Grandfather    High Cholesterol Maternal Grandfather    Heart disease Paternal Grandmother    High Cholesterol Paternal Grandmother    Heart disease Paternal Grandfather    High Cholesterol Paternal Grandfather     Objective:  Blood pressure 132/85, pulse 96, height 5' 2 (1.575 m), weight 132 lb 12.8 oz (60.2 kg), SpO2 98%. General: No acute distress.  Patient appears well-groomed.   Head:  Normocephalic/atraumatic Eyes:  fundi examined but not visualized Neck: supple, no paraspinal tenderness, full range of motion Heart: regular rate and rhythm Neurological Exam: Mental status: alert and oriented to person, place, and time, speech fluent and not dysarthric, language intact. Cranial nerves: CN I: not tested CN II: pupils equal, round and reactive to light, visual fields intact CN III, IV, VI:  full range of motion, no nystagmus, no ptosis CN V: facial sensation intact. CN VII: upper and lower face symmetric CN VIII: hearing intact CN IX, X: gag intact, uvula midline CN XI: sternocleidomastoid and trapezius muscles intact CN XII: tongue midline Bulk & Tone: normal, no fasciculations. Motor:  muscle strength 5/5 throughout Sensation:  Pinprick and vibratory sensation intact. Deep Tendon Reflexes:  2+ throughout,  toes downgoing.   Finger to nose testing:  Without dysmetria.   Gait:  Normal station and stride.  Romberg negative.    Juliene Dunnings, DO  CC: Clotilda Single, MD        [1]  Current Outpatient Medications on File Prior to Visit  Medication Sig Dispense Refill   amphetamine-dextroamphetamine (ADDERALL XR) 15 MG 24 hr capsule Take by mouth every morning.     amphetamine-dextroamphetamine (ADDERALL XR) 5  MG 24 hr capsule Take 5 mg by mouth daily.     buPROPion (WELLBUTRIN XL) 150 MG 24 hr tablet Take 300 mg by mouth daily. 150 mg twice daily     cetirizine (ZYRTEC) 10 MG chewable tablet Chew 10 mg by mouth daily.     clindamycin  (CLEOCIN  T) 1 % lotion Apply topically 2 (two) times daily. Apply a thin layer to face. 60 mL 0   fluconazole  (DIFLUCAN ) 150 MG tablet Take 1 tablet (150 mg total) by mouth as needed. (Patient not taking: Reported on 01/05/2024) 2 tablet 0   levocetirizine (XYZAL) 5 MG tablet Take 5 mg by mouth daily.     predniSONE  (STERAPRED UNI-PAK 21 TAB) 10 MG (21) TBPK tablet Take as directed 21 tablet 0   QULIPTA  60 MG TABS TAKE 1 TABLET BY MOUTH AS NEEDED FOR MIGRAINE 30 tablet 0   valACYclovir  (VALTREX ) 1000 MG tablet Take 1 tablet (1,000 mg total) by mouth daily. Take for 5 days 5 tablet 6   No current facility-administered medications on file prior to visit.  [2] No Known Allergies  "

## 2024-05-16 ENCOUNTER — Encounter: Payer: Self-pay | Admitting: Neurology

## 2024-05-16 ENCOUNTER — Ambulatory Visit: Admitting: Neurology

## 2024-05-16 VITALS — BP 132/85 | HR 96 | Ht 62.0 in | Wt 132.8 lb

## 2024-05-16 DIAGNOSIS — G43009 Migraine without aura, not intractable, without status migrainosus: Secondary | ICD-10-CM

## 2024-05-16 MED ORDER — ONDANSETRON HCL 4 MG PO TABS: 4.0000 mg | ORAL_TABLET | Freq: Three times a day (TID) | ORAL | 5 refills | Status: AC | PRN

## 2024-05-16 MED ORDER — SUMATRIPTAN SUCCINATE 100 MG PO TABS: 100.0000 mg | ORAL_TABLET | ORAL | 5 refills | Status: AC | PRN

## 2024-05-16 NOTE — Patient Instructions (Signed)
" °  Continue QULIPTA  daily Take SUMATRIPAN at earliest onset of headache.  May repeat dose once in 2 hours if needed.  Maximum 2 tablets in 24 hours. Take ONDANSETRON  for nausea Limit use of pain relievers to no more than 9 days out of the month.  These medications include acetaminophen , NSAIDs (ibuprofen /Advil /Motrin , naproxen/Aleve, triptans (Imitrex /sumatriptan ), Excedrin, and narcotics.  This will help reduce risk of rebound headaches. Be aware of common food triggers Routine exercise Stay adequately hydrated (aim for 64 oz water daily) Keep headache diary Maintain proper stress management Maintain proper sleep hygiene Do not skip meals Consider supplements:  magnesium  citrate 400mg  daily, riboflavin 400mg  daily, coenzyme Q10 100mg  three times daily OR MigreLief combination pill twice daily.  "

## 2024-06-03 ENCOUNTER — Encounter: Payer: Self-pay | Admitting: Neurology

## 2024-06-04 ENCOUNTER — Other Ambulatory Visit: Payer: Self-pay | Admitting: Neurology

## 2024-06-04 MED ORDER — PREDNISONE 10 MG PO TABS: ORAL_TABLET | ORAL | 0 refills | Status: AC

## 2024-06-05 ENCOUNTER — Other Ambulatory Visit: Payer: Self-pay | Admitting: Family Medicine

## 2024-11-26 ENCOUNTER — Ambulatory Visit: Payer: Self-pay | Admitting: Neurology
